# Patient Record
Sex: Male | Born: 1960
Health system: Southern US, Community
[De-identification: ages and names within clinical notes are randomized; demographics above are authoritative.]

## PROBLEM LIST (undated history)

## (undated) DIAGNOSIS — R570 Cardiogenic shock: Secondary | ICD-10-CM

## (undated) DIAGNOSIS — M5090 Cervical disc disorder, unspecified, unspecified cervical region: Secondary | ICD-10-CM

## (undated) DIAGNOSIS — N529 Male erectile dysfunction, unspecified: Secondary | ICD-10-CM

## (undated) DIAGNOSIS — E785 Hyperlipidemia, unspecified: Secondary | ICD-10-CM

## (undated) DIAGNOSIS — I5031 Acute diastolic (congestive) heart failure: Secondary | ICD-10-CM

## (undated) DIAGNOSIS — I77810 Thoracic aortic ectasia: Secondary | ICD-10-CM

## (undated) DIAGNOSIS — I2510 Atherosclerotic heart disease of native coronary artery without angina pectoris: Secondary | ICD-10-CM

## (undated) DIAGNOSIS — I10 Essential (primary) hypertension: Secondary | ICD-10-CM

## (undated) DIAGNOSIS — R7303 Prediabetes: Secondary | ICD-10-CM

## (undated) HISTORY — DX: Cervical disc disorder, unspecified, unspecified cervical region: M50.90

## (undated) HISTORY — DX: Male erectile dysfunction, unspecified: N52.9

## (undated) HISTORY — PX: CERVICAL SPINE SURGERY: SHX589

---
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
## 1998-03-04 HISTORY — PX: TOTAL HIP ARTHROPLASTY: SHX124

## 1999-02-01 ENCOUNTER — Encounter: Payer: Self-pay | Admitting: Orthopaedic Surgery

## 1999-02-06 ENCOUNTER — Inpatient Hospital Stay (HOSPITAL_COMMUNITY): Admission: RE | Admit: 1999-02-06 | Discharge: 1999-02-10 | Payer: Self-pay | Admitting: Orthopaedic Surgery

## 1999-02-06 ENCOUNTER — Encounter: Payer: Self-pay | Admitting: Orthopaedic Surgery

## 1999-02-06 ENCOUNTER — Encounter (INDEPENDENT_AMBULATORY_CARE_PROVIDER_SITE_OTHER): Payer: Self-pay | Admitting: *Deleted

## 2001-03-04 HISTORY — PX: REVISION TOTAL HIP ARTHROPLASTY: SHX766

## 2001-05-15 ENCOUNTER — Inpatient Hospital Stay (HOSPITAL_COMMUNITY): Admission: AD | Admit: 2001-05-15 | Discharge: 2001-05-18 | Payer: Self-pay | Admitting: Neurology

## 2001-05-16 ENCOUNTER — Encounter: Payer: Self-pay | Admitting: Neurology

## 2004-08-20 ENCOUNTER — Encounter: Admission: RE | Admit: 2004-08-20 | Discharge: 2004-08-20 | Payer: Self-pay | Admitting: Orthopaedic Surgery

## 2004-12-13 ENCOUNTER — Inpatient Hospital Stay (HOSPITAL_COMMUNITY): Admission: RE | Admit: 2004-12-13 | Discharge: 2004-12-15 | Payer: Self-pay | Admitting: Orthopaedic Surgery

## 2008-01-07 ENCOUNTER — Encounter: Admission: RE | Admit: 2008-01-07 | Discharge: 2008-01-07 | Payer: Self-pay | Admitting: Nephrology

## 2009-06-13 ENCOUNTER — Encounter (INDEPENDENT_AMBULATORY_CARE_PROVIDER_SITE_OTHER): Payer: Self-pay | Admitting: *Deleted

## 2009-06-16 ENCOUNTER — Encounter (INDEPENDENT_AMBULATORY_CARE_PROVIDER_SITE_OTHER): Payer: Self-pay | Admitting: *Deleted

## 2009-06-21 ENCOUNTER — Ambulatory Visit: Payer: Self-pay | Admitting: Internal Medicine

## 2009-07-03 ENCOUNTER — Ambulatory Visit: Payer: Self-pay | Admitting: Internal Medicine

## 2010-03-04 HISTORY — PX: CERVICAL SPINE SURGERY: SHX589

## 2010-04-05 NOTE — Procedures (Signed)
Summary: Colonoscopy  Patient: Rebekah Sprinkle Note: All result statuses are Final unless otherwise noted.  Tests: (1) Colonoscopy (COL)   COL Colonoscopy           DONE     [facility]     [address]     [place], [place]  [address]           COLONOSCOPY PROCEDURE REPORT           PATIENT:  Avraj, Lindroth  MR#:  [id number]     BIRTHDATE:  April 23, 1960, 48 yrs. old  GENDER:  male     ENDOSCOPIST:  Hedwig Morton. Juanda Chance, MD     REF. BY:  Jeri Cos, M.D.     PROCEDURE DATE:  07/03/2009     PROCEDURE:  Colonoscopy 61607     ASA CLASS:  Class II     INDICATIONS:  Routine Risk Screening maternal cousin with colon     cancer     MEDICATIONS:   Versed 8 mg, Fentanyl 75 mcg           DESCRIPTION OF PROCEDURE:   After the risks benefits and     alternatives of the procedure were thoroughly explained, informed     consent was obtained.  Digital rectal exam was performed and     revealed no rectal masses.   The [id number] endoscope was     introduced through the anus and advanced to the cecum, which was     identified by both the appendix and ileocecal valve, without     limitations.  The quality of the prep was good, using MoviPrep.     The instrument was then slowly withdrawn as the colon was fully     examined.     <<PROCEDUREIMAGES>>           FINDINGS:  No polyps or cancers were seen (see image1, image2, and     image3).   Retroflexed views in the rectum revealed no     abnormalities.    The scope was then withdrawn from the patient     and the procedure completed.           COMPLICATIONS:  None     ENDOSCOPIC IMPRESSION:     1) No polyps or cancers     2) Normal colonoscopy     RECOMMENDATIONS:     1) high fiber diet     REPEAT EXAM:  In 10 year(s) for.           ______________________________     Hedwig Morton. Juanda Chance, MD           CC:           n.     eSIGNED:   Hedwig Morton. Raelyn Racette at 07/03/2009 03:37 PM           Nadara Mode, [id number]  Note: An exclamation  mark (!) indicates a result that was not dispersed into the flowsheet. Document Creation Date: 07/03/2009 3:38 PM _______________________________________________________________________  (1) Order result status: Final Collection or observation date-time: 07/03/2009 15:32 Requested date-time:  Receipt date-time:  Reported date-time:  Referring Physician:   Ordering Physician: Lina Sar [id number]) Specimen Source:  Source: Launa Grill Order Number: [id number] Lab site:   Appended Document: Colonoscopy    Clinical Lists Changes  Observations: Added new observation of COLONNXTDUE: 07/2019 (07/03/2009 15:52)

## 2010-04-05 NOTE — Letter (Signed)
Summary: [facility] Instructions  [name]  [address] [place], [place] [address]   Phone: [phone]  Fax: [phone]       Zachary Flynn    [birth year]    MRN: [id number]        Procedure Day [name]:  [name]  07/03/09     Arrival Time:  2:30PM     Procedure Time:  3:30PM     Location of Procedure:                    _X _  [facility] (4th Floor)                       PREPARATION FOR COLONOSCOPY WITH MOVIPREP   Starting 5 days prior to your procedure 06/28/09 do not eat nuts, seeds, popcorn, corn, beans, peas,  salads, or any raw vegetables.  Do not take any fiber supplements (e.g. Metamucil, Citrucel, and Benefiber).  THE DAY BEFORE YOUR PROCEDURE         DATE:  07/02/09  DAY: SUNDAY  1.  Drink clear liquids the entire day-NO SOLID FOOD  2.  Do not drink anything colored red or purple.  Avoid juices with pulp.  No orange juice.  3.  Drink at least 64 oz. (8 glasses) of fluid/clear liquids during the day to prevent dehydration and help the prep work efficiently.  CLEAR LIQUIDS INCLUDE: Water Jello Ice Popsicles Tea (sugar ok, no milk/cream) Powdered fruit flavored drinks Coffee (sugar ok, no milk/cream) Gatorade Juice: apple, white grape, white cranberry  Lemonade Clear bullion, consomm, broth Carbonated beverages (any kind) Strained chicken noodle soup Hard Candy                             4.  In the morning, mix first dose of MoviPrep solution:    Empty 1 Pouch A and 1 Pouch B into the disposable container    Add lukewarm drinking water to the top line of the container. Mix to dissolve    Refrigerate (mixed solution should be used within 24 hrs)  5.  Begin drinking the prep at 5:00 p.m. The MoviPrep container is divided by 4 marks.   Every 15 minutes drink the solution down to the next mark (approximately 8 oz) until the full liter is complete.   6.  Follow completed prep with 16 oz of clear liquid of your choice (Nothing red  or purple).  Continue to drink clear liquids until bedtime.  7.  Before going to bed, mix second dose of MoviPrep solution:    Empty 1 Pouch A and 1 Pouch B into the disposable container    Add lukewarm drinking water to the top line of the container. Mix to dissolve    Refrigerate  THE DAY OF YOUR PROCEDURE      DATE: 07/03/09  DAY: MONDAY  Beginning at 10:30AM (5 hours before procedure):         1. Every 15 minutes, drink the solution down to the next mark (approx 8 oz) until the full liter is complete.  2. Follow completed prep with 16 oz. of clear liquid of your choice.    3. You may drink clear liquids until 1:30PM (2 HOURS BEFORE PROCEDURE).   MEDICATION INSTRUCTIONS  Unless otherwise instructed, you should take regular prescription medications with a small sip of water   as early as possible the morning  of your procedure.         OTHER INSTRUCTIONS  You will need a responsible adult at least 52 years of age to accompany you and drive you home.   This person must remain in the waiting room during your procedure.  Wear loose fitting clothing that is easily removed.  Leave jewelry and other valuables at home.  However, you may wish to bring a book to read or  an iPod/MP3 player to listen to music as you wait for your procedure to start.  Remove all body piercing jewelry and leave at home.  Total time from sign-in until discharge is approximately 2-3 hours.  You should go home directly after your procedure and rest.  You can resume normal activities the  day after your procedure.  The day of your procedure you should not:   Drive   Make legal decisions   Operate machinery   Drink alcohol   Return to work  You will receive specific instructions about eating, activities and medications before you leave.    The above instructions have been reviewed and explained to me by   Wyona Almas RN  June 21, 2009 1:14 PM     I fully understand and can  verbalize these instructions _____________________________ Date _________

## 2010-04-05 NOTE — Miscellaneous (Signed)
Summary: [facility] Previsit/prep  Clinical Lists Changes  Medications: Added new medication of MOVIPREP 100 GM  SOLR (PEG-KCL-NACL-NASULF-NA ASC-C) As per prep instructions. - Signed Rx of MOVIPREP 100 GM  SOLR (PEG-KCL-NACL-NASULF-NA ASC-C) As per prep instructions.;  #1 x 0;  Signed;  Entered by: Wyona Almas RN;  Authorized by: Hart Carwin MD;  Method used: Electronically to [facility] [address], [place], [place]  [address], Ph: [phone] or [phone], Fax: [phone] Observations: Added new observation of NKA: T (06/21/2009 12:35)    Prescriptions: MOVIPREP 100 GM  SOLR (PEG-KCL-NACL-NASULF-NA ASC-C) As per prep instructions.  #1 x 0   Entered by:   Wyona Almas RN   Authorized by:   Hart Carwin MD   Signed by:   Wyona Almas RN on 06/21/2009   Method used:   Electronically to        [facility] (retail)       [address]       [place], [place]  [address]       Ph: [phone] or [phone]       Fax: [phone]   RxID:   [id number]

## 2010-04-05 NOTE — Letter (Signed)
Summary: Previsit letter  [facility]  [address] [place], [place] [address]   Phone: [phone]  Fax: [phone]       06/13/2009 MRN: [id number]  [facility] [address] [place], [place]  [address]  Dear Zachary Flynn,  Welcome to the Gastroenterology Division at [facility].    You are scheduled to see a nurse for your pre-procedure visit on 06-21-09 at 1pm on the 3rd floor at [facility], [address]  We ask that you try to arrive at our office 15 minutes prior to your appointment time to allow for check-in.  Your nurse visit will consist of discussing your medical and surgical history, your immediate family medical history, and your medications.    Please bring a complete list of all your medications or, if you prefer, bring the medication bottles and we will list them.  We will need to be aware of both prescribed and over the counter drugs.  We will need to know exact dosage information as well.  If you are on blood thinners (Coumadin, Plavix, Aggrenox, Ticlid, etc.) please call our office today/prior to your appointment, as we need to consult with your physician about holding your medication.   Please be prepared to read and sign documents such as consent forms, a financial agreement, and acknowledgement forms.  If necessary, and with your consent, a friend or relative is welcome to sit-in on the nurse visit with you.  Please bring your insurance card so that we may make a copy of it.  If your insurance requires a referral to see a specialist, please bring your referral form from your primary care physician.  No co-pay is required for this nurse visit.     If you cannot keep your appointment, please call [phone] to cancel or reschedule prior to your appointment date.  This allows [place] the opportunity to schedule an appointment for another patient in need of care.    Thank you for choosing [facility] for your medical  needs.  We appreciate the opportunity to care for you.  Please visit [place] at our website  to learn more about our practice.                     Sincerely.                                                                                                                   The Gastroenterology Division

## 2010-07-20 NOTE — H&P (Signed)
[place]  Patient:    Zachary Flynn, Zachary Flynn Visit Number: [id number] MRN: [id number]          Service Type: MED Location: 3000 [id number] [id number] Attending Physician:  Lesly Dukes Dictated by:   Marlan Palau, M.D. Admit Date:  05/15/2001   CC:         Jarome Matin, M.D.  Osborn Coho, M.D.  [facility], [address] [place], [place]   History and Physical  CHIEF COMPLAINT: Zachary Flynn is a 50 year old left-handed black gentleman, born 1961-02-17, with a history of blurred vision over the last week prior to this admission.  HISTORY OF PRESENT ILLNESS: This patient noted onset of left eye aching sensations, pain with eye movement that began approximately one week prior to this admission, and has progressed with evidence of fogging, central scotoma. The patient has had washout of colors as well.  Two days prior to admission he had noted similar problems with the right eye as well.  The patient has also noted that with neck flexion he gets paresthesias down into the leg below the knees.  This patient has not noted any other numbness, weakness, gait disturbance, bowel or bladder disturbance that has occurred.  The patient was seen by Dr. Marti Sleigh and was given some eye drops, but upon return for re-visit was thought to have bilateral papilledema.  The patient was sent over for further evaluation and was admitted for what appears to be bilateral optic neuritis.  PAST MEDICAL HISTORY:  1. Unremarkable with exception of the bilateral visual loss as above.  2. History of left hip surgery in December 2002 with degenerative arthritis.  MEDICATIONS: The patient is on no medications.  ALLERGIES: None known.  SOCIAL HISTORY: Does not smoke or drink.  The patient is married.  Has one daughter, one son.  Lives with his wife and children.  He has a high school education plus one year of college.  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  FAMILY HISTORY: Notable for some problems with high blood pressure, diabetes running in the family.  REVIEW OF SYSTEMS: Notable for some visual loss, eye pain, blurred vision as above.  Otherwise, the Review Of Systems is unremarkable.  PHYSICAL EXAMINATION:  VITAL SIGNS: Blood pressure 146/92, heart rate 84 and regular.  Height 5 feet 9 inches.  Weight 192 pounds.  HEENT: Visual acuity is 20/40 -1 OD, cannot be tested in the left eye.  HEENT: Head atraumatic.  Pupils are dilated at the time of this examination. Discs were flat bilaterally.  No venous pulsations were seen.  Extraocular movements were full.  NECK: Supple.  No carotid bruits noted.  RESPIRATORY: Clear.  CARDIOVASCULAR: Regular rate and rhythm.  No obvious murmurs or rubs noted.  ABDOMEN: Positive bowel sounds.  No organomegaly or tenderness noted.  EXTREMITIES: Without significant edema.  NEUROLOGIC: Cranial nerves as above.  Facial symmetry is present.  The patient has good sensation of face to pinprick and soft touch bilaterally.  The patient has good strength of facial muscles and the muscles with head turning and shoulder shrug bilaterally.  The patient has relatively full visual fields to double simultaneous stimulation.  Again, extraocular movements were full; no evidence of nystagmus or intraocular ophthalmoplegia seen on either side. Motor testing shows 5/5 strength in all fours.  Good symmetric motor tone is noted throughout.  Sensory testing is intact to pinprick, soft  touch, vibratory sensation, position sense in all fours.  No evidence of extinction seen.  The patient has good symmetry reflexes.  Toes are downgoing bilaterally.  The patient had good finger-to-nose-to-finger and heel-to-shin. Gait normal.  Tandem gait normal.  Romberg negative.  No evidence of pronator drift is seen.  LABORATORY DATA: Pending at the time of this  evaluation.  IMPRESSION: History of progressive visual loss, probable bilateral optic neuritis, left greater than right.  This patient also appears to report Lhermittes sign that has developed over the last several days.  Most likely etiology of the current symptomatology would be rapid onset optic neuritis, likely associated with multiple sclerosis.  Do need to consider and rule out other etiologies such as Devics disease, Lebers optic neuritis, and other toxic sources of optic neuritis such as wood alcohol exposure.  The patient is being admitted for further evaluation.  I suppose a pituitary or a parasella process does need to be considered.  PLAN:  1. Admission to [facility]. [facility].  2. High-dose methylprednisolone therapy.  3. MRI scan of the brain.  4. MRI of the cervical spine.  5. Admission blood work.  6. Consider lumbar puncture. Dictated by:   Marlan Palau, M.D. Attending Physician:  Lesly Dukes DD:  05/16/01 TD:  05/18/01 Job: [id number]

## 2010-07-20 NOTE — Discharge Summary (Signed)
Zachary Flynn, Zachary Flynn               ACCOUNT NO.:  [id number]   MEDICAL RECORD NO.:  [id number]          PATIENT TYPE:  INP   LOCATION:  5006                         FACILITY:  [facility]   PHYSICIAN:  Claude Manges. Whitfield, M.D.DATE OF BIRTH:  [birth year]   DATE OF ADMISSION:  12/13/2004  DATE OF DISCHARGE:  12/15/2004                                 DISCHARGE SUMMARY   ADMISSION DIAGNOSES:  1.  Loosening left total hip arthroplasty.  2.  Elevated blood pressure.   DISCHARGE DIAGNOSES:  Loose left total hip arthroplasty, acetabular  component, with revision of femoral head and acetabular polyethylene  component and curettage of acetabular cyst with small grafting.  1.  Acute blood loss anemia secondary to surgery.  2.  Elevated blood pressure.   SURGICAL PROCEDURES:  On December 13, 2004 Zachary Flynn underwent a revision of  the femoral head and the acetabular  polyethylene component of his left  total hip with curettage of the acetabular cyst and bone grafting by Dr.  Claude Manges. Whitfield, assisted by Rinaldo Ratel and Arnoldo Morale, PA-C. He  had an articulus femoral head size 32 mm + size 12 14 cone with a Duraloc  Marathon acetabular liner 10 degrees 32 mm inner diameter, 54 mm outer  diameter in a Duraloc dynamic rocking ring size 54 mm.   COMPLICATIONS:  None.   CONSULTANT:  1.  Pharmacy consult with Coumadin therapy December 13, 2004.  2.  [facility]  and physical therapy consult December 14, 2004.   HISTORY OF PRESENT ILLNESS:  This 50 year old black male patient presented  to Dr. Cleophas Dunker with a history of a left hip replacement done by Dr.  Cleophas Dunker in December  2000. When he came for his normal hip checkup with no  complaints in June he was noted to have signs of loosening about the hip. CT  scan showed large peri-acetabular cyst about the acetabular component and  eccentric wear of the polyethylene. Because this is so early he is  presenting for revision of the cup and  polyethylene component.   HOSPITAL COURSE:  Zachary Flynn tolerated the surgical process procedure well  without immediate postoperative complications. He was transferred to 5000.  Postop day #1 T-max 100.6, vitals stable. Pain controlled with meds.  Dressing was intact without drainage. He was started on therapy per protocol  and plans were made for possible discharge in the next day or so.   Postoperative day #2, he remained afebrile. He was switched effectively to  p.o. pain meds. He was ambulating well and it was felt he was ready for  discharge home and was discharged home later that day.   DISCHARGE INSTRUCTIONS:  Diet: He is to resume his regular pre-  hospitalization diet.   MEDICATIONS:  He was on no preop meds.  1.  Coumadin 10 milligrams p.o. q. Saturday and Sunday at 6:00 p.m. and he      is to have an INR check on December 17, 2004 and then [facility] pharmacy      will adjust the dose.  2.  Percocet 5/3 25 milligrams  1-2 tablets p.o. q.4 h p.r.n. for pain, 50      with no refill.  3.  Robaxin 500 milligrams 1-2 tablets p.o. q.6 h p.r.n. for spasms, 40 with      no refills.   ACTIVITY:  He can be out of bed partial weightbearing 50% or less on the  left leg with use of a walker. He is to have home health PT per [facility]. Please see the blue total knee discharge sheet for further  activity instructions.   Wound care: He may shower after no drainage from the wound for 2 days.  Please see the blue total knee discharge sheet for further wound care  instructions.   Follow-up: He is to follow up with Dr. Cleophas Dunker in our office in  approximately 10-12 days and he is to call [phone] for that appointment. He  is to follow up with Dr. Leretha Dykes for a blood pressure check in the next 1-2  weeks and he is to call his office for that appointment.   Laboratory data: X-ray taken of his left hip on October 12 showed  satisfactory appearance of the left total hip  arthroplasty.   On October 6 hemoglobin/hematocrit were 13.8 and 41.2. On October 14  hemoglobin 11.6, hematocrit 34.7.   October 6 PT 13.1, INR 1, on October 14 PT 15.4, INR 1.2.   On October 6 glucose was 134. It dropped to a low of 130 on the 14th.  Potassium was 3.9 on October 6 and on  October 14  it was 3.4. All other  laboratory studies were within normal limits.      Legrand Pitts Duffy, P.A.      Claude Manges. Cleophas Dunker, M.D.  Electronically Signed    KED/MEDQ  D:  02/08/2005  T:  02/08/2005  Job:  [id number]

## 2010-07-20 NOTE — Discharge Summary (Signed)
[place]  Patient:    Zachary Flynn, Zachary Flynn Visit Number: [id number] MRN: [id number]          Service Type: MED Location: 3000 3041 01 Attending Physician:  Lesly Dukes Dictated by:   Marlan Palau, M.D. Admit Date:  05/15/2001 Discharge Date: 05/18/2001   CC:         [facility], [address]   Discharge Summary  ADMISSION DIAGNOSIS:  Bilateral optic neuritis, rule out multiple sclerosis.  DISCHARGE DIAGNOSES:  Bilateral optic neuritis.  PROCEDURES: 1. MRI of the brain. 2. MRI of the cervical spine. 3. Lumbar puncture.  COMPLICATIONS OF ABOVE PROCEDURES:  None.  HISTORY OF PRESENT ILLNESS:  Zachary Flynn is a 50 year old right-handed black gentleman born Feb 21, 1961, with a history of progressive visual loss in the left eye dating one week prior to this admission with onset of right eye vision changes two days prior to admission.  This patient has had some pain in the eyes with movement, has a central scotoma in the left eye, not on the right.  Colors are washed out.  The patient also describes what sounds like a Lhermittes phenomenon with tingling in both legs below the knees with neck flexion.  The patient was brought in for evaluation of the optic neuritis and to rule out multiple sclerosis.  PAST MEDICAL HISTORY: 1. Bilateral optic neuritis as above. 2. Prior left hip surgery.  MEDICATIONS:  The patient was on no medications prior to admission.  HABITS:  The patient does not smoke or drink.  ALLERGIES:  No known allergies.  Please refer to History & Physical Examination for Social History, Family History, Review of Systems, Physical Examination.  DIAGNOSTIC DATA:  Laboratory values notable for spinal fluid glucose of 108, total protein 20, cryptococcal antigen negative.  VDRL is pending.  IgG albumin ratio.  Oligoclonal banding is pending.  Glycosylated hemoglobin level 5.1.   Spinal fluid showed 3 red cells, 2 white cells.  White count 6.1, hemoglobin 13.4, hematocrit 39.5, MCV 83.6, platelets 180. Sed rate 9.  INR 1.0.  Sodium 138, potassium 3.5, chloride 105, CO2 27, glucose 151, BUN 10, creatinine 0.9, calcium 9.0, total protein 6.6, albumin 3.9, AST 27, ALT 32, alkaline phosphatase 92, total bilirubin 0.7.  Rheumatoid factor negative.  ANA pending.  Lymes titer pending.  HOSPITAL COURSE:  The patient has done well during the course of hospitalization.  This patient has been admitted for progressive optic neuritis that is more prominent on the left than the right.  The patient was subjected to a three-day methylprednisolone IV protocol, receiving 5 mg q.12h. The patient has done well with the protocol, has begun to regain vision in both eyes, but still has some problems with the left eye.  The patient notes that colors have improved in vision in both eyes, and the central scotoma that was noted on the left is now disappearing.  The patient denies any pain in the eyes at this point.  The patient has had no new symptoms of numbness, weakness.  The Lhermittes phenomenon has disappeared.  The patient underwent an MRI scan of the brain that was unremarkable.  MRI of the cervical spine was unremarkable.  Lumbar puncture was performed, and spinal fluid analysis to some degree is pending to include oligoclonal banding, Lymes titer.  The patient has also had an ACE level (angiotensin-converting enzyme level) in the spinal fluid is also pending.  At this point, the  patient will be discharged with a prednisone taper beginning at 40 mg a day and taking off about 5 mg every 3 days until she is off the medication.  Restoril 30 mg at night if needed for sleep.  She will follow up with [facility] within three weeks following discharge.  We will check a visual evoked response test sometime around that point. Dictated by:   Marlan Palau,  M.D. Attending Physician:  Lesly Dukes DD:  05/18/01 TD:  05/18/01 Job: [id number]

## 2010-07-20 NOTE — Op Note (Signed)
[facility]  Patient:    Zachary Flynn, Zachary Flynn Visit Number: [id number] MRN: [id number]          Service Type: MED Location: [id number] Attending Physician:  Lesly Dukes Dictated by:   Marlan Palau, M.D. Proc. Date: 05/16/01 Admit Date:  05/15/2001                             Operative Report  PROCEDURE:  Lumbar puncture note.  HISTORY:  This is a 50 year old black gentleman with a history of bilateral optic neuritis who is being evaluated for possible demyelinating disease.  MRI scan of the brain and MRI of the cervical spine were normal.  DESCRIPTION OF PROCEDURE:  Lumbar puncture was performed in the fetal position on the right side.  The lower back was cleaned with Betadine solution. Approximately 2 cc of 1% Xylocaine was used as local anesthetic.  A 20-gauge spinal needle was inserted at the L3-4 interspace and approximately 12 cc of clear, colorless spinal fluid was removed for testing.  Opening pressure initially was 330 mm water.  Tube #1 was sent for _____  banding, IgG-albumin ratio, Lyme antibody panel.  Tube #2 was sent for VDRL, Cryptococcal antigen, angiotensin converting enzyme level.  Tube #3 sent for cell differential, glucose, protein.  No complications to the above procedure were noted.  The patient tolerated the procedure well. Dictated by:   Marlan Palau, M.D. Attending Physician:  Lesly Dukes DD:  05/16/01 TD:  05/18/01 Job: [id number]

## 2010-07-20 NOTE — H&P (Signed)
[name], Flynn               ACCOUNT NO.:  [id number]   MEDICAL RECORD NO.:  [id number]          PATIENT TYPE:  INP   LOCATION:  NA                           FACILITY:  [facility]   PHYSICIAN:  Zachary Flynn, M.D.DATE OF BIRTH:  [birth year]   DATE OF ADMISSION:  12/13/2004  DATE OF DISCHARGE:                                HISTORY & PHYSICAL   CHIEF COMPLAINT:  Signs of loosening of his left hip replacement on x-ray.   HISTORY OF PRESENT ILLNESS:  This 50 year old, black male patient presented  to Dr. Cleophas Dunker with a history of a left hip replacement done by him on  February 06, 1999.  He came this June for his normal total hip checkup and on  x-ray was noted to have signs of loosening about the hip.  He then underwent  CT scan did show a large, periacetabular cyst about his acetabular  component.  There was also on x-ray eccentric wear noted of the  polyethylene.  The patient has had no complaints of pain or any injury to  the hip.  He says he has been maybe a little bit more active than he should  have been, but has really not had any increase in pain or any other problems  since the hip was placed.  He says he does have good days and bad days and  some days he has pain with certain movements, but that pattern has not  changed since his hip with placed.   ALLERGIES:  No known drug allergies.   CURRENT MEDICATIONS:  None.   PAST MEDICAL HISTORY:  Optic neuritis treated with IV medications in March  2003.   He denies any history of hypertension and diabetes mellitus, thyroid  disease, hiatal hernia, reflux, peptic ulcer disease, heart disease asthma  or any other chronic medical condition other than noted previously.   PAST SURGICAL HISTORY:  Left total hip arthroplasty February 06, 1999, by Dr.  Claude Manges. Flynn.   He denies any complications from the above-mentioned procedure.   SOCIAL HISTORY:  He denies any history of cigarette smoking, alcohol use or  drug use.   He is married and has two kids.  He lives with his wife and  children in a two-story house with 3 steps into the main entrance.  He is  currently a captain in the police force.  His medical doctor is Dr. Jeri Flynn and his phone number is [phone].   FAMILY HISTORY:  Mother is alive at age 8 with coronary artery disease,  history of a CABG, OA and hypertension.  He does not know anything about his  father.  He had one brother who recently passed away at age 64 with liver  disease due to alcoholism and one sister who just recently passed away at  age 51 with a history of myocardial infarction and diabetes.  He has one  living brother and four living sisters all alive and well.  His children are  a daughter, age 70, son age 45 and they are both alive and well.   REVIEW OF SYSTEMS:  HEENT:  He does wear reading glasses.  MUSCULOSKELETAL:  He has some cervical osteoarthritis and does have some the pain with range  of motion. He has had symptoms of the disk in the past but none recently.  He does complain of sinus congestion with weather changes.  He has been  diagnosed in the past with lactose intolerance and that is controlled with  diet. GENITOURINARY:  He has a remote history of hematuria in 1992, with a  full evaluation and that did resolve.  He has had no other problems.  He  does not have a living will nor power of attorney.  All other systems are  negative and noncontributory.   PHYSICAL EXAMINATION:  GENERAL:  Well-developed, well-nourished, black male  in no acute distress.  Talks easily with examiner.  Walks with a normal  gait. VITAL SIGNS:  Height 5 feet 8 inches, weight 182 pounds, BMI is 27.  Temperature 97.3 degrees Fahrenheit, pulse 80, respirations 20 and BP  160/84.  HEENT:  Normocephalic, atraumatic without frontal or maxillary sinus  tenderness to palpation.  Conjunctiva pink.  Sclerae anicteric. PERLA.  EOMs  intact.  No visible external ear deformities.  Hearing  grossly intact.  Right ear canal occluded with cerumen.  Left tympanic membrane pearly gray  with good light reflex.  Nose and nasal septum midline.  Nasal mucosa pink  and moist without exudates or polyps noted.  Buccal mucosa pink and moist.  Dentition in good repair.  Pharynx without erythema or exudates.  Tongue and  uvula midline.  Tongue without fasciculations and uvula rises equally with  phonation.  NECK:  No visible masses or lesions noted.  Trachea midline.  No palpable  lymphadenopathy or thyromegaly.  Carotids +2 bilaterally without bruits.  Full range of motion, nontender to palpation along the cervical spine.  He  does complain of some pain with range of motion, however.  CARDIAC:  Heart rate and rhythm regular.  S1-S2 present without rubs, clicks  or murmurs noted.  RESPIRATORY:  Respirations even and unlabored.  Breath sounds clear to  auscultation bilaterally without rales or wheezes noted.  ABDOMEN:  Rounded abdominal contour.  Bowel sounds present x4 quadrants.  Soft, nontender to palpation without hepatosplenomegaly or CVA tenderness.  Femoral pulses +1 bilaterally.  Nontender to palpation along the vertebral  column.  BREASTS:  Deferred at this time.  GENITALIA:  Deferred at this time.  RECTAL:  Deferred at this time.  MUSCULOSKELETAL:  No obvious deformities bilateral upper extremities with  full range of motion of these extremities without pain.  Extremities are  symmetrical bilaterally.  Radial pulses +2 bilaterally.  He has full range  of motion of his knees, ankles and toes bilaterally.  DP and PT pulses are  +2.  No calf pain with palpation.  Negative Homans' sign and no lower  extremity edema.  Right hip has full extension and flexion to about 100  degrees without crepitus.  Full internal external rotation without pain.  Good abduction and adduction.  Left hip incision is well-healed and approximated.  No erythema or ecchymosis.  He has full extension of the  hip  and flexion to 90-95 degrees without pain.  He has pretty much full  internal/external rotation without any pain.  Good abduction adduction.  NEUROLOGIC:  Alert and oriented x3.  Cranial nerves II-XII grossly intact.  Strength 5/5 bilateral upper and lower extremities. Rapid alternating  movements intact.  Deep tendon reflexes 2+ bilateral upper and  lower  extremities.  Sensation intact to light touch.   LABORATORY DATA AND X-RAY FINDINGS:  X-rays taken in June 2006, show some  eccentricity of his hip and some cyst formation behind the acetabulum.  It  does appear that the polyethylene is wearing.  CT scan done in late June  shows a large, periacetabular cyst with eccentric wear of the polyethylene.  It is felt he has a histiocytic reaction with a cyst.  Questionable  loosening of the femoral stem.   IMPRESSION:  1.  Signs of loosening left total hip arthroplasty.  2.  Elevated blood pressure.   PLAN:  Mr. Hayashida will be admitted to [facility] on December 13, 2004,  where he will undergo a revision of his loosening left total hip  arthroplasty by Dr. Claude Manges. Flynn.  He will undergo the routine  preoperative laboratory  tests and studies prior to this procedure. As mentioned that he should be  evaluated for his elevated blood pressure prior to admission and he will try  to see Dr. Bascom Levels prior to that.  If he has any medical issues while he is  hospitalized we will consult Dr. Bascom Levels.      Legrand Pitts Duffy, P.A.      Zachary Manges. Cleophas Dunker, M.D.  Electronically Signed    KED/MEDQ  D:  12/03/2004  T:  12/03/2004  Job:  [id number]

## 2010-07-20 NOTE — Op Note (Signed)
NAMEAIMEE, TIMMONS               ACCOUNT NO.:  [id number]   MEDICAL RECORD NO.:  [id number]          PATIENT TYPE:  INP   LOCATION:  2899                         FACILITY:  [facility]   PHYSICIAN:  Claude Manges. 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   OPERATION PERFORMED:  1.  Revision of femoral head and polyethylene acetabular component, left      total hip replacement.  2.  Curettage of acetabular cysts and bone grafting.   SURGEON:  Claude Manges. Cleophas Dunker, M.D.   ASSISTANT:  1.  Lenard Galloway. Chaney Malling, M.D.  2.  Legrand Pitts. Duffy, P.A.   ANESTHESIA:  General orotracheal anesthesia.   COMPLICATIONS:  None.   INSERTED COMPONENTS:  A 32 mm outer diameter femoral head with a +5 neck  length, 54 mm outer diameter DuraLock Marathon polyethylene component and a  DuraLock Dynamic locking ring.   COMPONENTS REMOVED:  A 28 mm femoral head with a +5 neck length and an  Enduron acetabular component.   DESCRIPTION OF PROCEDURE:  With the patient comfortable on the operating  table and under general orotracheal anesthesia, the patient was placed in a  lateral decubitus position with the left side up.  The patient was secured  to the operating room table with the Innomed hip system.  The left hip was  then prepped from the iliac crest to below the knee with Betadine scrub and  then DuraPrep.  Sterile draping was performed.  The previous Southern  incision was utilized and elliptically excised.  By sharp dissection, the  incision was then carried down through adipose tissue to the level of the  iliotibial  band.  Self-retaining retractors were inserted.  I had excellent  hemostasis.  The iliotibial band was then incised and the gluteus muscle  fibers and fibers of the tensor fascia lata were then bluntly separated.  The capsule was identified through thick scar tissue with a spinal needle.  We carefully identified the sciatic nerve.  We inserted the nerve stimulator  and  carefully protected it throughout the operative procedure.   The hip capsule was then incised from the calcar and femoral neck to the  edge of the acetabulum.  The capsule was very thick.  There was considerable  wear debris and reactive tissue throughout the joint space.  Technically, it  was difficult because everything was so scarred, but we had a very nice  resection of all of the wear debris within the joint capsule maintaining  capsular integrity.   Approximately 80% the joint was then debrided of scar tissue and reactive  tissue.  There did not appear to be any evidence of infection.  There was  obvious wear on the polyethylene component as we could easily toggle  the 28  mm head within the acetabulum.   The hip was then dislocated after releasing the capsule.  The head was  removed.  The femoral head was then removed and the [facility] taper neck was  retracted anteriorly. We then removed the polyethylene component which was  inserted six years ago.  It was an Enduron polyethylene. The locking ring  was also removed.  We then carefully checked the acetabulum and felt that it  was perfectly intact without evidence of any obvious loosening.  The  polyethylene component was then placed back in the acetabulum and the [facility]  taper neck placed into the polyethylene so that we could visualize the  superior and anterior aspect of the acetabulum where there appeared to be  cyst formation by CT scan with careful retraction, the cysts were  identified.  The one more anteriorly was large, probably several centimeters  in diameter.   It was carefully debrided and curettaged.  This cyst more in  the central portion superiorly also involved the bone and there really was  not a cyst formation because there was really the bone had just worn away  from the acetabulum but at least 85% to 90% of the acetabulum was covered  with bone.   After debridement of the cyst, we filled it IC graft chamber allograft bone  mixed with Symphony.  It packed very nicely.   The neck was then placed anteriorly.  The acetabular component was removed.  We carefully irrigated the metallic acetabular component, again checked it  and it was perfectly intact.  We moved all soft tissue from around the  periphery and then inserted a 54 mm outer diameter Marathon polyethylene  liner with a 10 degree build up posteriorly.  The Morse taper neck was  carefully cleaned and then a 32 mm outer diameter femoral head with a +5  neck was then reinserted.  The entire construct was reduced and we had  perfect stability in all motions.  The wound was then copiously irrigated  with saline solution.  We checked the acetabulum to be sure there was no  loose material.  Again the sciatic nerve was carefully protected.  The  capsule was closed with 0 Ethibond suture as were short external rotators  which had scarred in.  The wound was again irrigated with saline solution.  The iliotibial band was closed with a running #1 Vicryl.  Subcutaneous  tissue was closed in several layers with 0 and 2-0 Vicryl.  Skin closed with  skin clips.  Sterile bulky dressing was applied followed by a knee  immobilizer.  The patient was placed prone, transferred to the operating  room stretcher and returned to the post anesthesia recovery room in  satisfactory condition.      Claude Manges. Cleophas Dunker, M.D.  Electronically Signed     PWW/MEDQ  D:  12/13/2004  T:  12/13/2004  Job:  [id number]

## 2010-07-20 NOTE — Op Note (Signed)
[facility]  Patient:    Zachary Flynn                       MRN: [id number] Proc. Date: 02/06/99 Adm. Date:  [id number] Attending:  Randolm Idol                           Operative Report  PREOPERATIVE DIAGNOSIS:  End stage osteoarthritis, left hip.  POSTOPERATIVE DIAGNOSIS:  End stage osteoarthritis, left hip.  OPERATION PERFORMED:  Left total hip replacement.  SURGEON:  Claude Manges. Cleophas Dunker, M.D.  ASSISTANT: 1. Lenard Galloway. Chaney Malling, M.D. 2. Jamelle Rushing, P.A.  ANESTHESIA:  General orotracheal.  COMPLICATIONS:  None.  COMPONENTS:  DePuy 100 series, DuraLock acetabular shell, 54 mm outer diameter ith an Enduron acetabular liner, 28 mm inner diameter, 10 degree angle, apex hole eliminator.  The size 12 standard, 5/8 pore coated femoral stem with a 28 mm head, +5 neck.  DESCRIPTION OF PROCEDURE:  With the patient comfortable on the operating table nd under general orotracheal anesthesia, he was placed in the lateral decubitus position with the left side up.  The patient was secured to the operating room table with the Innomed hip system.  The left hip was then prepped with Betadine scrub and then DuraPrep from the iliac crest to below the knees.  Sterile draping was performed.  A routine Southern incision was utilized and by sharp dissection carried down to subcutaneous tissue.  The iliotibial band was identified and incised along the length of the skin incision.  Self-retaining retractors were inserted.  The hip was then internally rotated. The short external rotators were identified and detached from the greater trochanter posteriorly.  The tendinous structures were tagged with 0 Tycron suture.  The capsule was identified and incised along the femoral neck and head. Synovial tissue exuded through the incisional region.  There was a small amount of clear  joint fluid.  Specimens of synovium were sent to the lab for  cytology.  The calcar guide was applied and the neck was then amputated approximately a fingerbreadth and a half from the proximal to the lesser trochanter.  The greater trochanteric notch was identified and a starter hole then made.  Reaming was performed to 11.5 mm o accept a 12 mm prosthesis.  Rasping was then performed with a 10.5 medial modified aspect and then a 12 medial modified aspect but I felt that I had considerably more room so therefore the standard rasp was the inserted without difficulty with a nice flush fit on the calcar.  Acetabular retractors were then inserted in four quadrants, synovial tissue was  excised and the labrum was excised.  Reaming was performed to 53 mm to accept a 54 mm outer diameter prosthesis.  The acetabulum was quite shallow, so we deepened it.  We trialed a 52 mm prosthesis which had good rim fit but we could completely seat it, then a 54 mm outer diameter prosthesis.  It had good rim fit but would not completely seat.  Accordingly we impacted a 54 mm outer diameter 100 series DePuy acetabular shell. We then trialed an acetabular liner followed by the rasp followed by the trial neck.  We trialed several neck lengths and felt that the +5 was the best fit. e had excellent stability in flexion and extension.  The trial components were removed.  The apex hole eliminator inserted into the acetabulum  followed by the 10 degree polyethylene liner.  The femoral component was then impacted at about a 15 degree anteversion.  We then trialed with the +5 28 mm neck.  I felt that we had excellent stability.  Therefore the neck was removed.  The 28 mm hip ball was then impacted.  The entire construct was then reduced.  We had excellent stability.  I felt that the leg lengths had been re-established as he was short preoperatively.  The wound was copiously irrigated with saline antibiotic solution throughout the operative procedure.  The  capsule was closed anatomically with 0 Tycron.  The short external rotators were reapproximated with similar material.  The iliotibial band was closed with running 0 Vicryl.  The subcutaneous closed in two layers with 0 and 2-0 Vicryl.  The skin was closed with skin clips.  Sterile bulky dressing was applied.  The patient was then placed in the supine position, transferred to the stretcher, awakened and returned to the post anesthesia recovery room in stable  condition.  A knee immobilizer was applied to the knee.  The patient tolerated he procedure without complications. DD:  02/06/99 TD:  02/07/99 Job: [id number]

## 2010-09-04 ENCOUNTER — Other Ambulatory Visit (HOSPITAL_COMMUNITY): Payer: Self-pay | Admitting: Nephrology

## 2010-09-04 DIAGNOSIS — E041 Nontoxic single thyroid nodule: Secondary | ICD-10-CM

## 2010-09-27 IMAGING — CR DG CHEST 2V
2 series · 2 of 2 positions shown · non-contrast
Comparison: 12/07/2004

CLINICAL DATA: Shortness of breath for 2 weeks.

CHEST - 2 VIEW

[view not recorded (1 of 2)]
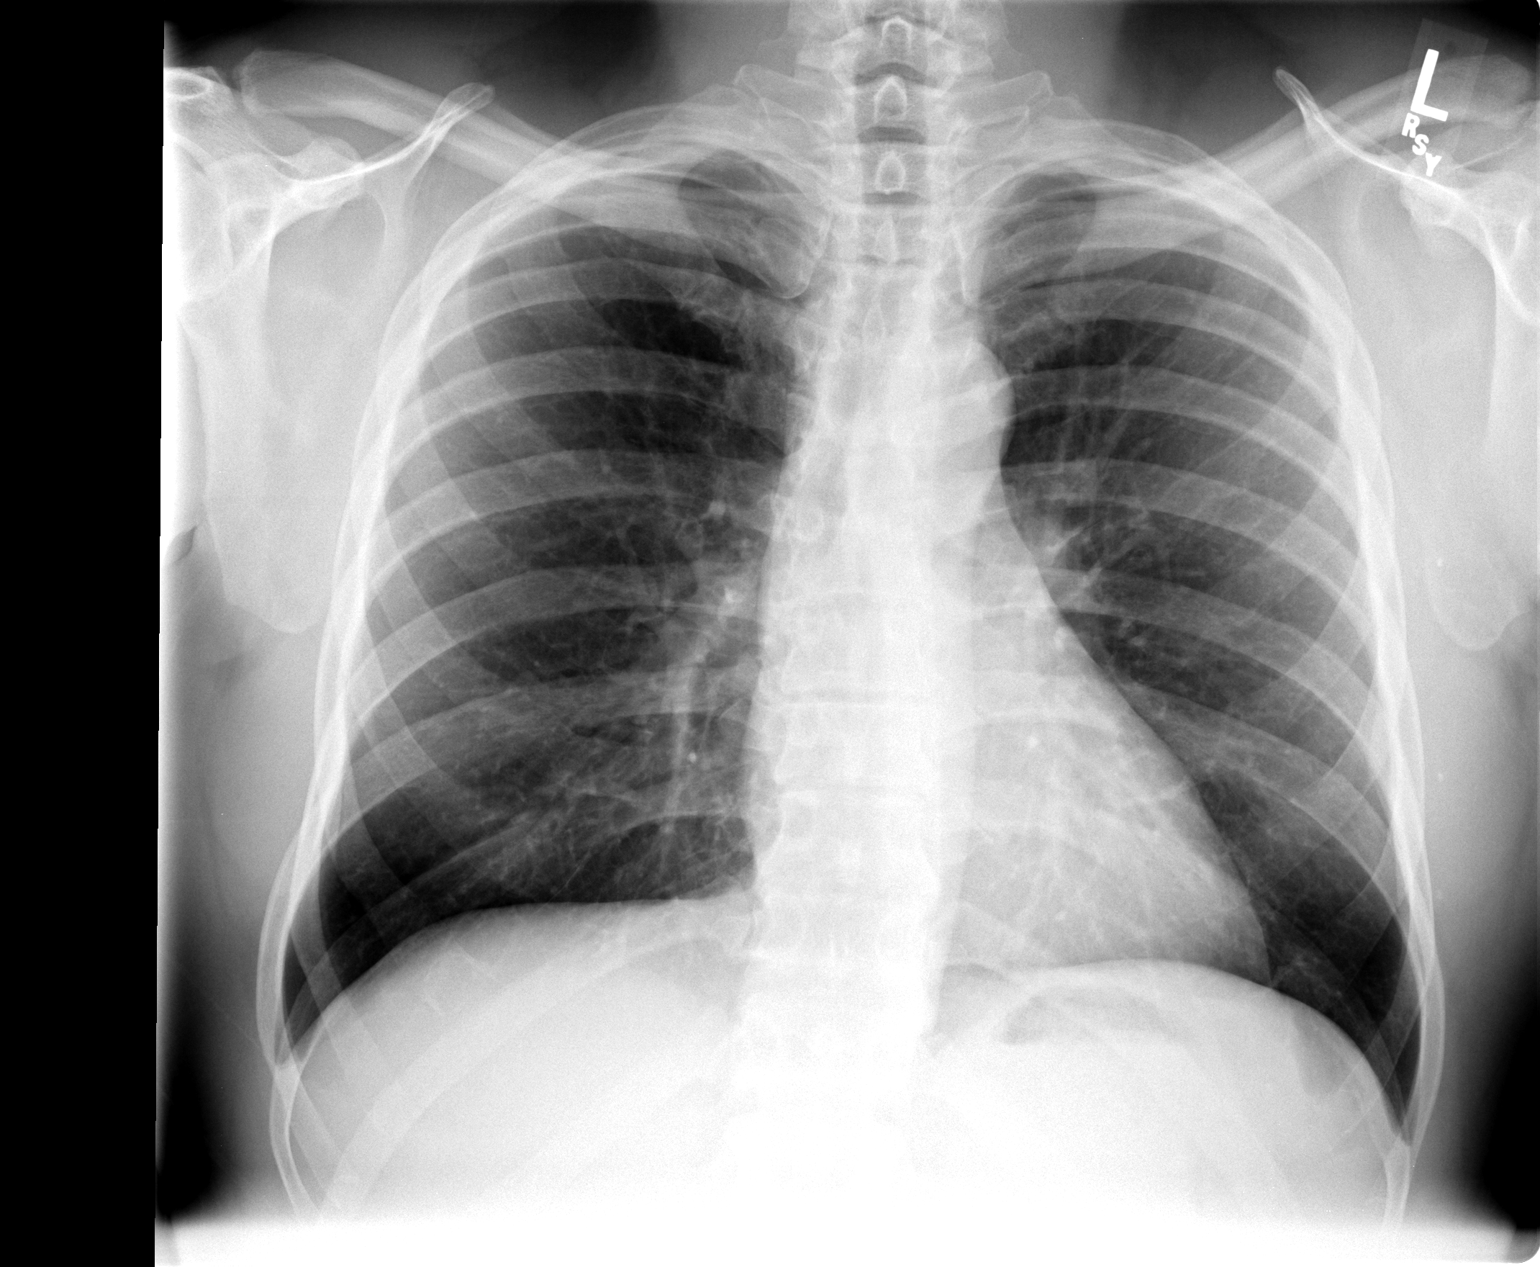

[view not recorded (2 of 2)]
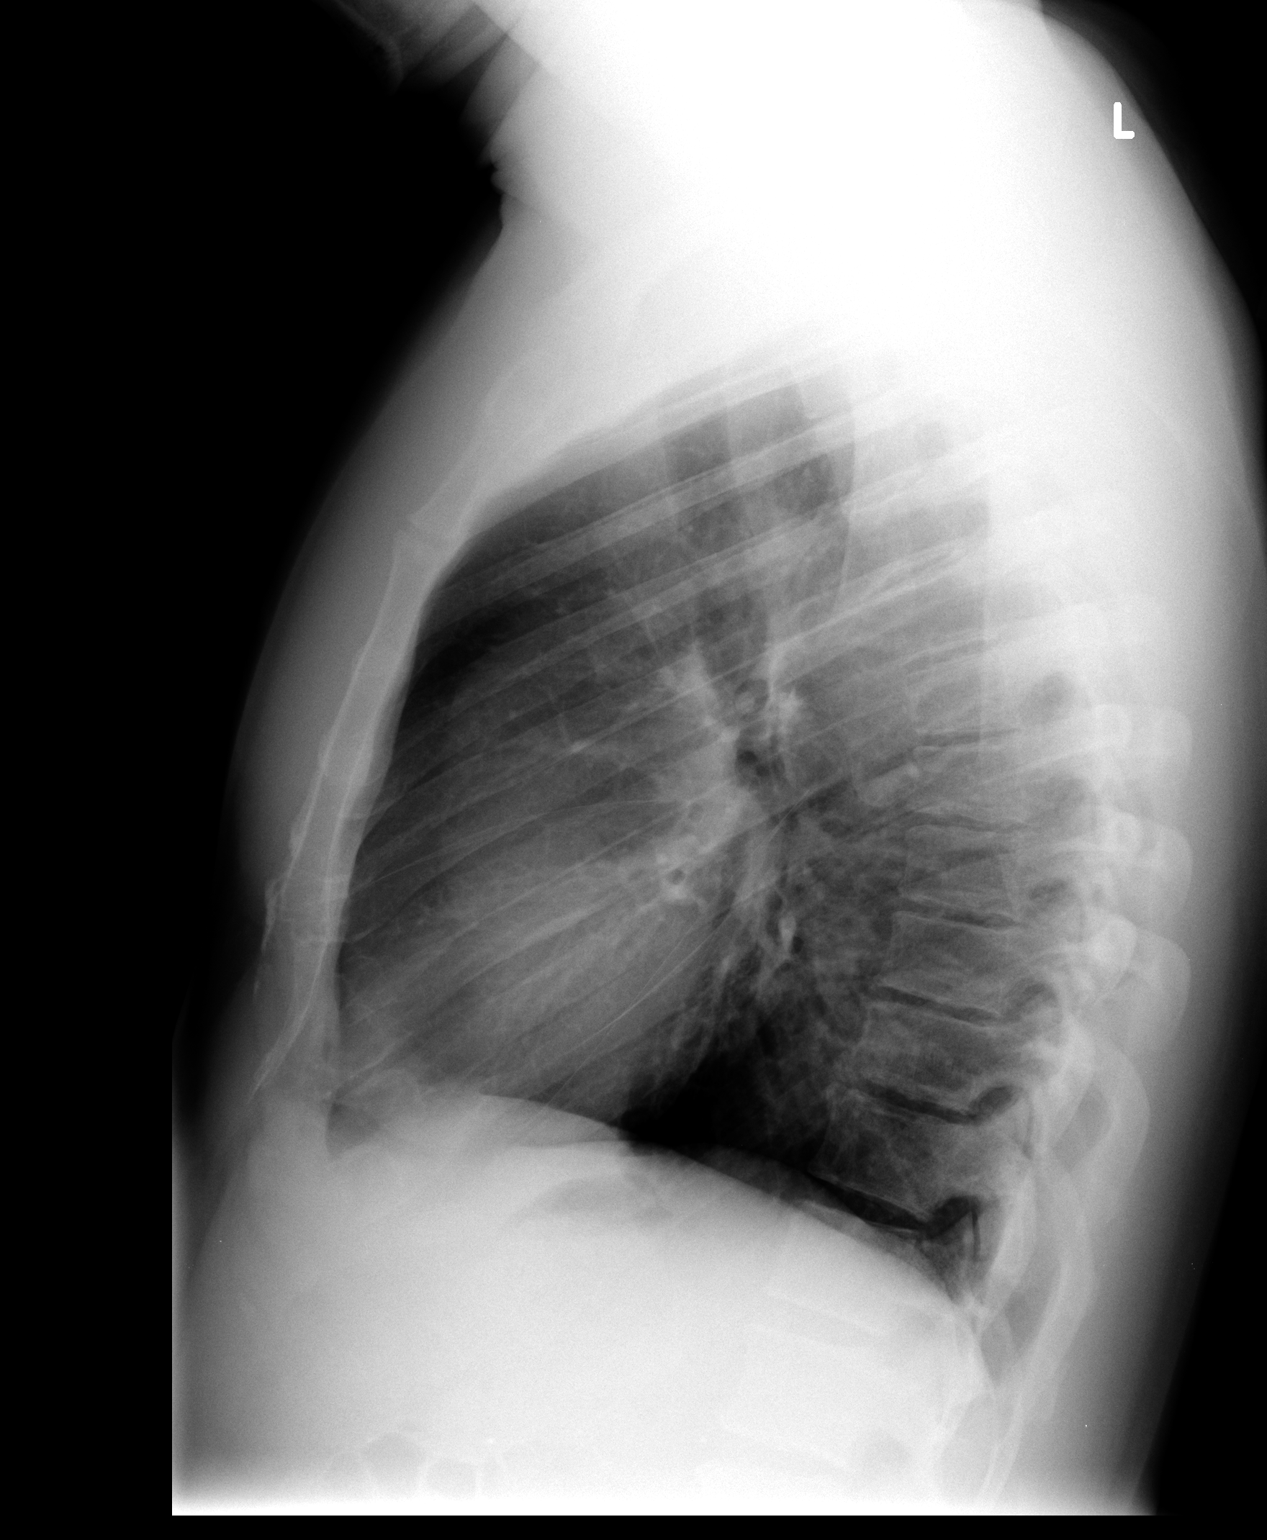

[2 of 2 positions shown; findings below may reference images not displayed]

FINDINGS: Cardiac and mediastinal contours appear normal.

The lungs appear clear.

No pleural effusion is identified.
IMPRESSION: 1.  No acute thoracic findings.

## 2010-10-09 ENCOUNTER — Other Ambulatory Visit (HOSPITAL_COMMUNITY): Payer: Self-pay

## 2010-10-16 ENCOUNTER — Ambulatory Visit (HOSPITAL_COMMUNITY)
Admission: RE | Admit: 2010-10-16 | Discharge: 2010-10-16 | Disposition: A | Discharge status: Home / Self Care | Payer: 59 | Source: Ambulatory Visit | Attending: Nephrology | Admitting: Nephrology

## 2010-10-16 DIAGNOSIS — E041 Nontoxic single thyroid nodule: Secondary | ICD-10-CM | POA: Insufficient documentation

## 2011-11-07 ENCOUNTER — Other Ambulatory Visit: Payer: Self-pay | Admitting: Nephrology

## 2016-03-06 DIAGNOSIS — M5412 Radiculopathy, cervical region: Secondary | ICD-10-CM | POA: Diagnosis not present

## 2016-03-06 DIAGNOSIS — M542 Cervicalgia: Secondary | ICD-10-CM | POA: Diagnosis not present

## 2016-03-06 DIAGNOSIS — M549 Dorsalgia, unspecified: Secondary | ICD-10-CM | POA: Diagnosis not present

## 2016-03-14 DIAGNOSIS — M4802 Spinal stenosis, cervical region: Secondary | ICD-10-CM | POA: Diagnosis not present

## 2016-03-15 DIAGNOSIS — M5412 Radiculopathy, cervical region: Secondary | ICD-10-CM | POA: Diagnosis not present

## 2016-03-21 DIAGNOSIS — M4712 Other spondylosis with myelopathy, cervical region: Secondary | ICD-10-CM | POA: Diagnosis not present

## 2016-04-12 DIAGNOSIS — Z0000 Encounter for general adult medical examination without abnormal findings: Secondary | ICD-10-CM | POA: Diagnosis not present

## 2016-04-12 DIAGNOSIS — M79645 Pain in left finger(s): Secondary | ICD-10-CM | POA: Diagnosis not present

## 2016-04-12 DIAGNOSIS — I10 Essential (primary) hypertension: Secondary | ICD-10-CM | POA: Diagnosis not present

## 2016-04-12 DIAGNOSIS — Z1159 Encounter for screening for other viral diseases: Secondary | ICD-10-CM | POA: Diagnosis not present

## 2016-04-22 DIAGNOSIS — Z981 Arthrodesis status: Secondary | ICD-10-CM | POA: Diagnosis not present

## 2016-04-22 DIAGNOSIS — M4712 Other spondylosis with myelopathy, cervical region: Secondary | ICD-10-CM | POA: Diagnosis not present

## 2016-04-22 DIAGNOSIS — M50121 Cervical disc disorder at C4-C5 level with radiculopathy: Secondary | ICD-10-CM | POA: Diagnosis not present

## 2016-04-22 DIAGNOSIS — M50221 Other cervical disc displacement at C4-C5 level: Secondary | ICD-10-CM | POA: Diagnosis not present

## 2016-05-06 DIAGNOSIS — M65312 Trigger thumb, left thumb: Secondary | ICD-10-CM | POA: Diagnosis not present

## 2016-05-09 DIAGNOSIS — M5412 Radiculopathy, cervical region: Secondary | ICD-10-CM | POA: Diagnosis not present

## 2016-06-05 DIAGNOSIS — M65312 Trigger thumb, left thumb: Secondary | ICD-10-CM | POA: Diagnosis not present

## 2016-06-26 DIAGNOSIS — M5412 Radiculopathy, cervical region: Secondary | ICD-10-CM | POA: Diagnosis not present

## 2016-10-22 DIAGNOSIS — R7301 Impaired fasting glucose: Secondary | ICD-10-CM | POA: Diagnosis not present

## 2017-02-11 DIAGNOSIS — H47219 Primary optic atrophy, unspecified eye: Secondary | ICD-10-CM | POA: Diagnosis not present

## 2017-05-19 DIAGNOSIS — Z0000 Encounter for general adult medical examination without abnormal findings: Secondary | ICD-10-CM | POA: Diagnosis not present

## 2017-05-19 DIAGNOSIS — Z13220 Encounter for screening for lipoid disorders: Secondary | ICD-10-CM | POA: Diagnosis not present

## 2018-02-11 DIAGNOSIS — H35033 Hypertensive retinopathy, bilateral: Secondary | ICD-10-CM | POA: Diagnosis not present

## 2018-07-07 DIAGNOSIS — Z125 Encounter for screening for malignant neoplasm of prostate: Secondary | ICD-10-CM | POA: Diagnosis not present

## 2018-07-07 DIAGNOSIS — Z0000 Encounter for general adult medical examination without abnormal findings: Secondary | ICD-10-CM | POA: Diagnosis not present

## 2018-07-07 DIAGNOSIS — M25559 Pain in unspecified hip: Secondary | ICD-10-CM | POA: Diagnosis not present

## 2018-07-07 DIAGNOSIS — I10 Essential (primary) hypertension: Secondary | ICD-10-CM | POA: Diagnosis not present

## 2018-07-07 DIAGNOSIS — Z13220 Encounter for screening for lipoid disorders: Secondary | ICD-10-CM | POA: Diagnosis not present

## 2018-08-04 ENCOUNTER — Other Ambulatory Visit: Payer: Self-pay

## 2018-08-04 ENCOUNTER — Encounter: Payer: 59 | Attending: Family Medicine | Admitting: *Deleted

## 2018-08-04 DIAGNOSIS — E119 Type 2 diabetes mellitus without complications: Secondary | ICD-10-CM | POA: Insufficient documentation

## 2018-08-04 NOTE — Patient Instructions (Signed)
Plan:  Aim for 4 Carb Choices per meal (60 grams) +/- 1 either way  Aim for 0-2 Carbs per snack if hungry  Include protein in moderation with your meals and snacks Consider reading food labels for Total Carbohydrate of foods Continue with your activity level by walking daily as tolerated Consider getting a meter so you can check BG every few days at alternate times per day

## 2018-08-05 NOTE — Progress Notes (Signed)
Diabetes Self-Management Education  Visit Type: First/Initial  Appt. Start Time: 1530 Appt. End Time: 1700  08/05/2018  Mr. Zachary Flynn, identified by name and date of birth, is a 58 y.o. male with a diagnosis of Diabetes: Type 2. Patient is newly diagnosed about 1 months ago. 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 He is most interested in learning about what foods to eat and what to avoid. He is physically active with his job and enjoys yard work on the weekends. He does not have a meter yet.  ASSESSMENT  There were no vitals taken for this visit. There is no height or weight on file to calculate BMI.  Diabetes Self-Management Education - 08/04/18 1550      Visit Information   Visit Type  First/Initial      Initial Visit   Diabetes Type  Type 2    Are you currently following a meal plan?  No    Are you taking your medications as prescribed?  Not on Medications    Date Diagnosed  07/2018      Health Coping   How would you rate your overall health?  Good      Psychosocial Assessment   Patient Belief/Attitude about Diabetes  Motivated to manage diabetes    Self-care barriers  None    Self-management support  Family    Other persons present  Patient    Patient Concerns  Nutrition/Meal planning;Glycemic Control    Special Needs  None    Preferred Learning Style  Auditory    Learning Readiness  Contemplating    How often do you need to have someone help you when you read instructions, pamphlets, or other written materials from your doctor or pharmacy?  1 - Never    What is the last grade level you completed in school?  Associates degree      Pre-Education Assessment   Patient understands the diabetes disease and treatment process.  Needs Instruction    Patient understands incorporating nutritional management into lifestyle.  Needs Instruction    Patient undertands incorporating  physical activity into lifestyle.  Needs Instruction    Patient understands using medications safely.  Needs Instruction    Patient understands monitoring blood glucose, interpreting and using results  Needs Instruction    Patient understands prevention, detection, and treatment of acute complications.  Needs Instruction    Patient understands prevention, detection, and treatment of chronic complications.  Needs Instruction    Patient understands how to develop strategies to address psychosocial issues.  Needs Instruction      Complications   Last HgB A1C per patient/outside source  5.9 %   Had FBG of 132 at MD office   How often do you check your blood sugar?  0 times/day (not testing)    Number of hypoglycemic episodes per month  0    Have you had a dilated eye exam in the past 12 months?  Yes    Have you had a dental exam in the past 12 months?  No    Are you checking your feet?  Yes    How many days per week are you checking your feet?  5      Dietary Intake   Breakfast  fresh fruit OR cereal with fresh fruit and walnuts OR PNB crackers with fresh fruit OR breakfast biscuit (belvita)     Lunch  brings from  home: 2 lean meat sandwiches on whole grain bread OR 2 burger patties with baked beans and broccoli    Snack (afternoon)  popcorn OR chips when gets home from work    [employer], vegetables, whole grain starch or pinto beans    Snack (evening)  none OR       Exercise   Exercise Type  Light (walking / raking leaves)    How many days per week to you exercise?  6    How many minutes per day do you exercise?  60    Total minutes per week of exercise  360      Patient Education   Previous Diabetes Education  No    Disease state   Factors that contribute to the development of diabetes;Definition of diabetes, type 1 and 2, and the diagnosis of diabetes    Nutrition management   Role of diet in the treatment of diabetes and the relationship between the three main macronutrients  and blood glucose level;Food label reading, portion sizes and measuring food.;Carbohydrate counting;Reviewed blood glucose goals for pre and post meals and how to evaluate the patients' food intake on their blood glucose level.    Physical activity and exercise   Role of exercise on diabetes management, blood pressure control and cardiac health.    Medications  Other (comment)   not on diabetes meds now   Monitoring  Taught/discussed recording of test results and interpretation of SMBG.;Identified appropriate SMBG and/or A1C goals.;Purpose and frequency of SMBG.    Chronic complications  Relationship between chronic complications and blood glucose control    Psychosocial adjustment  Role of stress on diabetes      Individualized Goals (developed by patient)   Nutrition  Follow meal plan discussed    Physical Activity  Exercise 3-5 times per week    Medications  Not Applicable    Monitoring   test blood glucose pre and post meals as discussed      Post-Education Assessment   Patient understands the diabetes disease and treatment process.  Demonstrates understanding / competency    Patient understands incorporating nutritional management into lifestyle.  Demonstrates understanding / competency    Patient undertands incorporating physical activity into lifestyle.  Demonstrates understanding / competency    Patient understands monitoring blood glucose, interpreting and using results  Demonstrates understanding / competency    Patient understands prevention, detection, and treatment of chronic complications.  Demonstrates understanding / competency      Outcomes   Expected Outcomes  Demonstrated interest in learning. Expect positive outcomes    Future DMSE  PRN    Program Status  Not Completed       Individualized Plan for Diabetes Self-Management Training:   Learning Objective:  Patient will have a greater understanding of diabetes self-management. Patient education plan is to attend  individual and/or group sessions per assessed needs and concerns.   Plan:   Patient Instructions  Plan:  Aim for 4 Carb Choices per meal (60 grams) +/- 1 either way  Aim for 0-2 Carbs per snack if hungry  Include protein in moderation with your meals and snacks Consider reading food labels for Total Carbohydrate of foods Continue with your activity level by walking daily as tolerated Consider getting a meter so you can check BG every few days at alternate times per day   Expected Outcomes:  Demonstrated interest in learning. Expect positive outcomes  Education material provided: Food label handouts, A1C conversion sheet, Meal  plan card and Carbohydrate counting sheet  If problems or questions, patient to contact team via:  Phone  Future DSME appointment: PRN

## 2018-11-03 DIAGNOSIS — I5032 Chronic diastolic (congestive) heart failure: Secondary | ICD-10-CM

## 2018-11-03 HISTORY — DX: Chronic diastolic (congestive) heart failure: I50.32

## 2018-12-02 ENCOUNTER — Inpatient Hospital Stay (HOSPITAL_COMMUNITY): Payer: 59

## 2018-12-02 ENCOUNTER — Inpatient Hospital Stay (HOSPITAL_COMMUNITY)
Admission: EM | Admit: 2018-12-02 | Discharge: 2018-12-04 | DRG: 246 | Disposition: A | Discharge status: Home / Self Care | Payer: 59 | Attending: Cardiology | Admitting: Cardiology

## 2018-12-02 ENCOUNTER — Encounter (HOSPITAL_COMMUNITY): Payer: Self-pay | Admitting: Cardiology

## 2018-12-02 ENCOUNTER — Inpatient Hospital Stay (HOSPITAL_COMMUNITY)
Admission: EM | Disposition: A | Discharge status: Home / Self Care | Payer: Self-pay | Source: Home / Self Care | Attending: Cardiology

## 2018-12-02 ENCOUNTER — Other Ambulatory Visit: Payer: Self-pay

## 2018-12-02 DIAGNOSIS — I25110 Atherosclerotic heart disease of native coronary artery with unstable angina pectoris: Secondary | ICD-10-CM | POA: Diagnosis present

## 2018-12-02 DIAGNOSIS — E782 Mixed hyperlipidemia: Secondary | ICD-10-CM | POA: Diagnosis present

## 2018-12-02 DIAGNOSIS — I2129 ST elevation (STEMI) myocardial infarction involving other sites: Secondary | ICD-10-CM | POA: Diagnosis present

## 2018-12-02 DIAGNOSIS — I77810 Thoracic aortic ectasia: Secondary | ICD-10-CM | POA: Diagnosis present

## 2018-12-02 DIAGNOSIS — E785 Hyperlipidemia, unspecified: Secondary | ICD-10-CM

## 2018-12-02 DIAGNOSIS — I2510 Atherosclerotic heart disease of native coronary artery without angina pectoris: Secondary | ICD-10-CM

## 2018-12-02 DIAGNOSIS — Z20828 Contact with and (suspected) exposure to other viral communicable diseases: Secondary | ICD-10-CM | POA: Diagnosis present

## 2018-12-02 DIAGNOSIS — R7303 Prediabetes: Secondary | ICD-10-CM | POA: Diagnosis present

## 2018-12-02 DIAGNOSIS — Z8249 Family history of ischemic heart disease and other diseases of the circulatory system: Secondary | ICD-10-CM

## 2018-12-02 DIAGNOSIS — Z79899 Other long term (current) drug therapy: Secondary | ICD-10-CM | POA: Diagnosis not present

## 2018-12-02 DIAGNOSIS — R570 Cardiogenic shock: Secondary | ICD-10-CM | POA: Diagnosis present

## 2018-12-02 DIAGNOSIS — Z955 Presence of coronary angioplasty implant and graft: Secondary | ICD-10-CM

## 2018-12-02 DIAGNOSIS — I110 Hypertensive heart disease with heart failure: Secondary | ICD-10-CM | POA: Diagnosis present

## 2018-12-02 DIAGNOSIS — I2109 ST elevation (STEMI) myocardial infarction involving other coronary artery of anterior wall: Secondary | ICD-10-CM | POA: Diagnosis not present

## 2018-12-02 DIAGNOSIS — I5031 Acute diastolic (congestive) heart failure: Secondary | ICD-10-CM | POA: Diagnosis present

## 2018-12-02 DIAGNOSIS — I2102 ST elevation (STEMI) myocardial infarction involving left anterior descending coronary artery: Secondary | ICD-10-CM | POA: Diagnosis present

## 2018-12-02 DIAGNOSIS — I10 Essential (primary) hypertension: Secondary | ICD-10-CM | POA: Diagnosis present

## 2018-12-02 DIAGNOSIS — Z96642 Presence of left artificial hip joint: Secondary | ICD-10-CM | POA: Diagnosis present

## 2018-12-02 HISTORY — PX: TRANSTHORACIC ECHOCARDIOGRAM: SHX275

## 2018-12-02 HISTORY — DX: Essential (primary) hypertension: I10

## 2018-12-02 HISTORY — DX: Thoracic aortic ectasia: I77.810

## 2018-12-02 HISTORY — DX: Prediabetes: R73.03

## 2018-12-02 HISTORY — DX: Hyperlipidemia, unspecified: E78.5

## 2018-12-02 HISTORY — DX: Atherosclerotic heart disease of native coronary artery without angina pectoris: I25.10

## 2018-12-02 HISTORY — PX: CORONARY/GRAFT ACUTE MI REVASCULARIZATION: CATH118305

## 2018-12-02 HISTORY — PX: LEFT HEART CATH AND CORONARY ANGIOGRAPHY: CATH118249

## 2018-12-02 LAB — BASIC METABOLIC PANEL
Anion gap: 19 — ABNORMAL HIGH (ref 5–15)
BUN: 15 mg/dL (ref 6–20)
CO2: 22 mmol/L (ref 22–32)
Calcium: 9.1 mg/dL (ref 8.9–10.3)
Chloride: 99 mmol/L (ref 98–111)
Creatinine, Ser: 1.08 mg/dL (ref 0.61–1.24)
GFR calc Af Amer: >60 mL/min (ref >60)
GFR calc non Af Amer: >60 mL/min (ref >60)
Glucose, Bld: 202 mg/dL — ABNORMAL HIGH (ref 70–99)
Potassium: 3.4 mmol/L — ABNORMAL LOW (ref 3.5–5.1)
Sodium: 140 mmol/L (ref 135–145)

## 2018-12-02 LAB — CBC WITH DIFFERENTIAL/PLATELET
Abs Immature Granulocytes: 0.03 10*3/uL (ref 0.00–0.07)
Basophils Absolute: 0 10*3/uL (ref 0.0–0.1)
Basophils Relative: 0 %
Eosinophils Absolute: 0.2 10*3/uL (ref 0.0–0.5)
Eosinophils Relative: 3 %
HCT: 38.6 % — ABNORMAL LOW (ref 39.0–52.0)
Hemoglobin: 11.9 g/dL — ABNORMAL LOW (ref 13.0–17.0)
Immature Granulocytes: 0 %
Lymphocytes Relative: 30 %
Lymphs Abs: 2.2 10*3/uL (ref 0.7–4.0)
MCH: 29 pg (ref 26.0–34.0)
MCHC: 30.8 g/dL (ref 30.0–36.0)
MCV: 94.1 fL (ref 80.0–100.0)
Monocytes Absolute: 0.7 10*3/uL (ref 0.1–1.0)
Monocytes Relative: 10 %
Neutro Abs: 3.9 10*3/uL (ref 1.7–7.7)
Neutrophils Relative %: 57 %
Platelets: 178 10*3/uL (ref 150–400)
RBC: 4.1 MIL/uL — ABNORMAL LOW (ref 4.22–5.81)
RDW: 11.9 % (ref 11.5–15.5)
WBC: 7.1 10*3/uL (ref 4.0–10.5)
nRBC: 0 % (ref 0.0–0.2)

## 2018-12-02 LAB — LIPID PANEL
Cholesterol: 144 mg/dL (ref 0–200)
HDL: 32 mg/dL — ABNORMAL LOW (ref >40)
LDL Cholesterol: 88 mg/dL (ref 0–99)
Total CHOL/HDL Ratio: 4.5 RATIO
Triglycerides: 120 mg/dL (ref <150)
VLDL: 24 mg/dL (ref 0–40)

## 2018-12-02 LAB — POCT I-STAT, CHEM 8
BUN: 16 mg/dL (ref 6–20)
Calcium, Ion: 1.2 mmol/L (ref 1.15–1.40)
Chloride: 102 mmol/L (ref 98–111)
Creatinine, Ser: 0.9 mg/dL (ref 0.61–1.24)
Glucose, Bld: 227 mg/dL — ABNORMAL HIGH (ref 70–99)
HCT: 39 % (ref 39.0–52.0)
Hemoglobin: 13.3 g/dL (ref 13.0–17.0)
Potassium: 3.6 mmol/L (ref 3.5–5.1)
Sodium: 140 mmol/L (ref 135–145)
TCO2: 22 mmol/L (ref 22–32)

## 2018-12-02 LAB — CBC
HCT: 46.3 % (ref 39.0–52.0)
Hemoglobin: 14.7 g/dL (ref 13.0–17.0)
MCH: 28.8 pg (ref 26.0–34.0)
MCHC: 31.7 g/dL (ref 30.0–36.0)
MCV: 90.8 fL (ref 80.0–100.0)
Platelets: 235 10*3/uL (ref 150–400)
RBC: 5.1 MIL/uL (ref 4.22–5.81)
RDW: 11.8 % (ref 11.5–15.5)
WBC: 13.6 10*3/uL — ABNORMAL HIGH (ref 4.0–10.5)
nRBC: 0 % (ref 0.0–0.2)

## 2018-12-02 LAB — COMPREHENSIVE METABOLIC PANEL
ALT: 30 U/L (ref 0–44)
AST: 34 U/L (ref 15–41)
Albumin: 4.4 g/dL (ref 3.5–5.0)
Alkaline Phosphatase: 77 U/L (ref 38–126)
Anion gap: 11 (ref 5–15)
BUN: 15 mg/dL (ref 6–20)
CO2: 26 mmol/L (ref 22–32)
Calcium: 9.1 mg/dL (ref 8.9–10.3)
Chloride: 101 mmol/L (ref 98–111)
Creatinine, Ser: 1.16 mg/dL (ref 0.61–1.24)
GFR calc Af Amer: >60 mL/min (ref >60)
GFR calc non Af Amer: >60 mL/min (ref >60)
Glucose, Bld: 202 mg/dL — ABNORMAL HIGH (ref 70–99)
Potassium: 4.2 mmol/L (ref 3.5–5.1)
Sodium: 138 mmol/L (ref 135–145)
Total Bilirubin: 1.2 mg/dL (ref 0.3–1.2)
Total Protein: 6.8 g/dL (ref 6.5–8.1)

## 2018-12-02 LAB — POCT ACTIVATED CLOTTING TIME
Activated Clotting Time: 186 seconds
Activated Clotting Time: 219 seconds
Activated Clotting Time: 235 seconds
Activated Clotting Time: 235 seconds
Activated Clotting Time: 246 seconds

## 2018-12-02 LAB — HEMOGLOBIN A1C
Hgb A1c MFr Bld: 5.8 % — ABNORMAL HIGH (ref 4.8–5.6)
Mean Plasma Glucose: 119.76 mg/dL

## 2018-12-02 LAB — TSH: TSH: 1.404 u[IU]/mL (ref 0.350–4.500)

## 2018-12-02 LAB — PROTIME-INR
INR: 1 (ref 0.8–1.2)
Prothrombin Time: 13.3 seconds (ref 11.4–15.2)

## 2018-12-02 LAB — APTT: aPTT: 21 seconds — ABNORMAL LOW (ref 24–36)

## 2018-12-02 LAB — TROPONIN I (HIGH SENSITIVITY)
Troponin I (High Sensitivity): 111 ng/L (ref <18)
Troponin I (High Sensitivity): >27000 ng/L (ref <18)
Troponin I (High Sensitivity): >27000 ng/L (ref <18)
Troponin I (High Sensitivity): >27000 ng/L (ref <18)

## 2018-12-02 LAB — MRSA PCR SCREENING: MRSA by PCR: NEGATIVE

## 2018-12-02 LAB — ECHOCARDIOGRAM COMPLETE

## 2018-12-02 LAB — SARS CORONAVIRUS 2 BY RT PCR (HOSPITAL ORDER, PERFORMED IN ~~LOC~~ HOSPITAL LAB): SARS Coronavirus 2: NEGATIVE

## 2018-12-02 SURGERY — CORONARY/GRAFT ACUTE MI REVASCULARIZATION
Anesthesia: LOCAL

## 2018-12-02 MED ORDER — HEPARIN SODIUM (PORCINE) 1000 UNIT/ML IJ SOLN
INTRAMUSCULAR | Status: DC | PRN
  Administered 2018-12-02: 3000 [IU] via INTRAVENOUS
  Administered 2018-12-02 (×2): 5000 [IU] via INTRAVENOUS

## 2018-12-02 MED ORDER — FUROSEMIDE 10 MG/ML IJ SOLN
INTRAMUSCULAR | Status: DC | PRN
  Administered 2018-12-02: 40 mg via INTRAVENOUS

## 2018-12-02 MED ORDER — FENTANYL CITRATE (PF) 100 MCG/2ML IJ SOLN
INTRAMUSCULAR | Status: AC
  Filled 2018-12-02: qty 2

## 2018-12-02 MED ORDER — SODIUM CHLORIDE 0.9% FLUSH: 3.0000 mL | INTRAVENOUS | Status: DC | PRN

## 2018-12-02 MED ORDER — CARVEDILOL 3.125 MG PO TABS
3.1250 mg | ORAL_TABLET | Freq: Two times a day (BID) | ORAL | Status: DC
  Administered 2018-12-02 – 2018-12-03 (×3): 3.125 mg via ORAL
  Filled 2018-12-02 (×3): qty 1

## 2018-12-02 MED ORDER — NITROGLYCERIN 1 MG/10 ML FOR IR/CATH LAB
INTRA_ARTERIAL | Status: DC | PRN
  Administered 2018-12-02: 200 ug via INTRACORONARY

## 2018-12-02 MED ORDER — POTASSIUM CHLORIDE CRYS ER 20 MEQ PO TBCR
20.0000 meq | EXTENDED_RELEASE_TABLET | Freq: Two times a day (BID) | ORAL | Status: AC
  Administered 2018-12-02 (×2): 20 meq via ORAL
  Filled 2018-12-02 (×2): qty 1

## 2018-12-02 MED ORDER — NOREPINEPHRINE 4 MG/250ML-% IV SOLN: 2.0000 ug/min | INTRAVENOUS | Status: DC

## 2018-12-02 MED ORDER — MORPHINE SULFATE (PF) 2 MG/ML IV SOLN: 2.0000 mg | INTRAVENOUS | Status: DC | PRN

## 2018-12-02 MED ORDER — NITROGLYCERIN 1 MG/10 ML FOR IR/CATH LAB
INTRA_ARTERIAL | Status: AC
  Filled 2018-12-02: qty 10

## 2018-12-02 MED ORDER — NITROGLYCERIN 0.4 MG SL SUBL
SUBLINGUAL_TABLET | SUBLINGUAL | Status: AC
  Filled 2018-12-02: qty 1

## 2018-12-02 MED ORDER — SODIUM CHLORIDE 0.9 % IV SOLN
INTRAVENOUS | Status: AC | PRN
  Administered 2018-12-02: 250 mL via INTRAVENOUS

## 2018-12-02 MED ORDER — HEPARIN (PORCINE) IN NACL 1000-0.9 UT/500ML-% IV SOLN
INTRAVENOUS | Status: AC
  Filled 2018-12-02: qty 1000

## 2018-12-02 MED ORDER — SODIUM CHLORIDE 0.9 % IV SOLN: INTRAVENOUS | Status: DC

## 2018-12-02 MED ORDER — TICAGRELOR 90 MG PO TABS
ORAL_TABLET | ORAL | Status: AC
  Filled 2018-12-02: qty 2

## 2018-12-02 MED ORDER — ASPIRIN 81 MG PO CHEW: 324.0000 mg | CHEWABLE_TABLET | Freq: Once | ORAL | Status: DC

## 2018-12-02 MED ORDER — NITROGLYCERIN 0.4 MG SL SUBL
0.4000 mg | SUBLINGUAL_TABLET | SUBLINGUAL | Status: DC | PRN
  Administered 2018-12-02: 0.4 mg via SUBLINGUAL

## 2018-12-02 MED ORDER — HEPARIN SODIUM (PORCINE) 1000 UNIT/ML IJ SOLN
INTRAMUSCULAR | Status: AC
  Filled 2018-12-02: qty 1

## 2018-12-02 MED ORDER — INFLUENZA VAC SPLIT QUAD 0.5 ML IM SUSY
0.5000 mL | PREFILLED_SYRINGE | INTRAMUSCULAR | Status: DC | PRN
  Filled 2018-12-02: qty 0.5

## 2018-12-02 MED ORDER — VERAPAMIL HCL 2.5 MG/ML IV SOLN
INTRAVENOUS | Status: AC
  Filled 2018-12-02: qty 2

## 2018-12-02 MED ORDER — HEPARIN SODIUM (PORCINE) 5000 UNIT/ML IJ SOLN
4000.0000 [IU] | Freq: Once | INTRAMUSCULAR | Status: AC
  Administered 2018-12-02: 4000 [IU] via INTRAVENOUS
  Filled 2018-12-02: qty 1

## 2018-12-02 MED ORDER — ORAL CARE MOUTH RINSE
15.0000 mL | Freq: Two times a day (BID) | OROMUCOSAL | Status: DC
  Administered 2018-12-03: 15 mL via OROMUCOSAL

## 2018-12-02 MED ORDER — ONDANSETRON HCL 4 MG/2ML IJ SOLN: 4.0000 mg | Freq: Four times a day (QID) | INTRAMUSCULAR | Status: DC | PRN

## 2018-12-02 MED ORDER — FUROSEMIDE 10 MG/ML IJ SOLN
INTRAMUSCULAR | Status: AC
  Filled 2018-12-02: qty 4

## 2018-12-02 MED ORDER — ACETAMINOPHEN 325 MG PO TABS: 650.0000 mg | ORAL_TABLET | ORAL | Status: DC | PRN

## 2018-12-02 MED ORDER — NOREPINEPHRINE 4 MG/250ML-% IV SOLN
INTRAVENOUS | Status: AC
  Filled 2018-12-02: qty 250

## 2018-12-02 MED ORDER — SODIUM CHLORIDE 0.9% FLUSH
3.0000 mL | Freq: Two times a day (BID) | INTRAVENOUS | Status: DC
  Administered 2018-12-02 – 2018-12-03 (×2): 3 mL via INTRAVENOUS

## 2018-12-02 MED ORDER — FUROSEMIDE 10 MG/ML IJ SOLN
40.0000 mg | Freq: Once | INTRAMUSCULAR | Status: AC
  Administered 2018-12-02: 40 mg via INTRAVENOUS
  Filled 2018-12-02: qty 4

## 2018-12-02 MED ORDER — VERAPAMIL HCL 2.5 MG/ML IV SOLN
INTRAVENOUS | Status: DC | PRN
  Administered 2018-12-02: 10 mL via INTRA_ARTERIAL

## 2018-12-02 MED ORDER — CHLORHEXIDINE GLUCONATE CLOTH 2 % EX PADS: 6.0000 | MEDICATED_PAD | Freq: Every day | CUTANEOUS | Status: DC

## 2018-12-02 MED ORDER — SODIUM CHLORIDE 0.9 % IV SOLN: INTRAVENOUS | Status: AC

## 2018-12-02 MED ORDER — LIDOCAINE HCL (PF) 1 % IJ SOLN
INTRAMUSCULAR | Status: AC
  Filled 2018-12-02: qty 30

## 2018-12-02 MED ORDER — LIDOCAINE HCL (PF) 1 % IJ SOLN
INTRAMUSCULAR | Status: DC | PRN
  Administered 2018-12-02: 2 mL via INTRADERMAL

## 2018-12-02 MED ORDER — MIDAZOLAM HCL 2 MG/2ML IJ SOLN
INTRAMUSCULAR | Status: AC
  Filled 2018-12-02: qty 2

## 2018-12-02 MED ORDER — NOREPINEPHRINE BITARTRATE 1 MG/ML IV SOLN
INTRAVENOUS | Status: AC | PRN
  Administered 2018-12-02: 10 ug/min via INTRAVENOUS

## 2018-12-02 MED ORDER — SODIUM CHLORIDE 0.9 % IV SOLN: 250.0000 mL | INTRAVENOUS | Status: DC | PRN

## 2018-12-02 MED ORDER — FENTANYL CITRATE (PF) 100 MCG/2ML IJ SOLN
INTRAMUSCULAR | Status: DC | PRN
  Administered 2018-12-02 (×2): 25 ug via INTRAVENOUS

## 2018-12-02 MED ORDER — ASPIRIN 81 MG PO CHEW
81.0000 mg | CHEWABLE_TABLET | Freq: Every day | ORAL | Status: DC
  Administered 2018-12-02 – 2018-12-04 (×3): 81 mg via ORAL
  Filled 2018-12-02 (×3): qty 1

## 2018-12-02 MED ORDER — MIDAZOLAM HCL 2 MG/2ML IJ SOLN
INTRAMUSCULAR | Status: DC | PRN
  Administered 2018-12-02 (×2): 1 mg via INTRAVENOUS

## 2018-12-02 MED ORDER — ATORVASTATIN CALCIUM 80 MG PO TABS
80.0000 mg | ORAL_TABLET | Freq: Every day | ORAL | Status: DC
  Administered 2018-12-02 – 2018-12-03 (×2): 80 mg via ORAL
  Filled 2018-12-02 (×2): qty 1

## 2018-12-02 MED ORDER — TICAGRELOR 90 MG PO TABS
ORAL_TABLET | ORAL | Status: DC | PRN
  Administered 2018-12-02: 180 mg via ORAL

## 2018-12-02 MED ORDER — TICAGRELOR 90 MG PO TABS
90.0000 mg | ORAL_TABLET | Freq: Two times a day (BID) | ORAL | Status: DC
  Administered 2018-12-02 – 2018-12-04 (×5): 90 mg via ORAL
  Filled 2018-12-02 (×5): qty 1

## 2018-12-02 SURGICAL SUPPLY — 20 items
BALLN SAPPHIRE 2.5X12 (BALLOONS) ×2
BALLN SAPPHIRE ~~LOC~~ 3.5X18 (BALLOONS) ×1 IMPLANT
BALLOON SAPPHIRE 2.5X12 (BALLOONS) IMPLANT
CATH INFINITI 5FR ANG PIGTAIL (CATHETERS) ×1 IMPLANT
CATH OPTITORQUE TIG 4.0 5F (CATHETERS) ×1 IMPLANT
CATH SWAN GANZ VIP 7.5F (CATHETERS) ×1 IMPLANT
CATH VISTA GUIDE 6FR XBLAD3.5 (CATHETERS) ×1 IMPLANT
DEVICE RAD COMP TR BAND LRG (VASCULAR PRODUCTS) ×1 IMPLANT
GLIDESHEATH SLEND SS 6F .021 (SHEATH) ×1 IMPLANT
GUIDEWIRE INQWIRE 1.5J.035X260 (WIRE) IMPLANT
INQWIRE 1.5J .035X260CM (WIRE) ×2
KIT ENCORE 26 ADVANTAGE (KITS) ×1 IMPLANT
KIT HEART LEFT (KITS) ×2 IMPLANT
PACK CARDIAC CATHETERIZATION (CUSTOM PROCEDURE TRAY) ×2 IMPLANT
SHEATH PINNACLE 7F 10CM (SHEATH) ×1 IMPLANT
STENT RESOLUTE ONYX 3.0X22 (Permanent Stent) ×1 IMPLANT
SYR MEDRAD MARK 7 150ML (SYRINGE) ×2 IMPLANT
TRANSDUCER W/STOPCOCK (MISCELLANEOUS) ×2 IMPLANT
TUBING CIL FLEX 10 FLL-RA (TUBING) ×2 IMPLANT
WIRE ASAHI PROWATER 180CM (WIRE) ×1 IMPLANT

## 2018-12-02 NOTE — H&P (Signed)
Cardiology Admission History and Physical:   Patient ID: Zachary Flynn MRN: [id number]; DOB: 20-Apr-1960   Admission date: 12/02/2018  Primary Care Provider: Lawerance Cruel, MD Primary Cardiologist: No primary care provider on file.  New to [facility] Primary Electrophysiologist:  None   Chief Complaint: Anterior STEMI  Patient Profile:   Zachary Flynn is a 58 y.o. male with history of hypertension hyper lipidemia as well as family history of CAD who presents to the emergency room via EMS with roughly 1/2 hours of ongoing chest pain that began roughly 45 minutes before EMS arrival.  EKG by EMS showed anterior ST elevations also seen here in the ER.  Code STEMI was called in route.  History of Present Illness:   Zachary Flynn has noted about on 3-4 episodes of intermittent chest discomfort mostly exertional while doing yard work over the last 2 weeks.  He has not had any resting symptoms until tonight when he was try to lay down in the evening of 12/01/2018.  He was unable to really get comfortable and roughly 40 minutes prior to EMS arrival (roughly 1/2 hours prior to ER arrival) he started noticing pain that began behind his shoulder blades and then radiated to the front of his chest.  Also had some numbness and tingling in his hands.  Pain was 9 out of 10 at baseline went down to 5 out of 10 with subtle nitroglycerin and aspirin by EMS.  Is roughly 4-5 out of 10 chest pain upon arrival to ER.  Cardiovascular ROS: positive for - chest pain, shortness of breath and Some nausea but no vomiting negative for - dyspnea on exertion, edema, irregular heartbeat, orthopnea, palpitations, paroxysmal nocturnal dyspnea, rapid heart rate or Syncope/near syncope or TIA/amaurosis fugax   Heart Pathway Score:     Past Medical History:  Diagnosis Date  . Essential hypertension 12/02/2018  . Hyperlipidemia with target LDL less than 70 12/02/2018   No documented surgical history Past Surgical  History:  Procedure Laterality Date  . CERVICAL SPINE SURGERY    . REVISION TOTAL HIP ARTHROPLASTY Left 2003  . TOTAL HIP ARTHROPLASTY Left 2000     Medications Prior to Admission: Prior to Admission medications   Medication Sig Start Date End Date Taking? Authorizing Provider  amLODipine-benazepril (LOTREL) 10-20 MG capsule Take 1 capsule by mouth daily.    [provider]  terbinafine (LAMISIL) 250 MG tablet Take 250 mg by mouth daily. Take daily for one week each month    [provider]     Allergies:    Allergies  Allergen Reactions  . Food     Lactose intolerant, no dairy products  . Pollen Extract     Social History:   Social History   Socioeconomic History  . Marital status: Married    Spouse name: Not on file  . Number of children: Not on file  . Years of education: Not on file  . Highest education level: Not on file  Occupational History  . Not on file  Social Needs  . Financial resource strain: Not on file  . Food insecurity    Worry: Not on file    Inability: Not on file  . Transportation needs    Medical: Not on file    Non-medical: Not on file  Tobacco Use  . Smoking status: Never Smoker  . Smokeless tobacco: Never Used  Substance and Sexual Activity  . Alcohol use: Not on file  . Drug use:  Not on file  . Sexual activity: Not on file  Lifestyle  . Physical activity    Days per week: Not on file    Minutes per session: Not on file  . Stress: Not on file  Relationships  . Social Herbalist on phone: Not on file    Gets together: Not on file    Attends religious service: Not on file    Active member of club or organization: Not on file    Attends meetings of clubs or organizations: Not on file    Relationship status: Not on file  . Intimate partner violence    Fear of current or ex partner: Not on file    Emotionally abused: Not on file    Physically abused: Not on file    Forced sexual activity: Not on file   Other Topics Concern  . Not on file  Social History Narrative  . Not on file    Family History: Basic review The patient's family history includes CAD in his mother and sister; Heart attack in his mother and sister.    ROS:  No antecedent fevers, chills, sweats, cough or acute dyspnea.  Has had some nausea but no vomiting tonight. No melena, hematochezia, hematuria, epistaxis All other ROS reviewed and negative.     Please see the history of present illness.   Physical Exam/Data:   Vitals:   12/02/18 0121  BP: 127/75  Pulse: (!) 58  Resp: 17  Temp: (!) 96.2 F (35.7 C)  TempSrc: Tympanic  SpO2: 96%   No intake or output data in the 24 hours ending 12/02/18 0144 No flowsheet data found.   There is no height or weight on file to calculate BMI.  General:  Well nourished, well developed, in mild to moderate HEENT: normal, /AT/EOMI Lymph: no adenopathy Neck: no obvious JVD Endocrine:  No thryomegaly Vascular: No carotid bruits; FA pulses 2+ bilaterally without bruits  Cardiac:  normal but distant S1, S2; RRR; no murmur or gallops. Lungs:  clear to auscultation bilaterally, no wheezing, rhonchi or rales  Abd: soft, nontender, no hepatomegaly  Ext: nO clubbing/cyanosis/edema Musculoskeletal:  No deformities, BUE and BLE strength normal and equal Skin: warm and dry  Neuro:  CNs 2-12 intact, no focal abnormalities noted Psych:  Normal affect    EKG:  The ECG that was done in the emergency room was personally reviewed and demonstrates sinus rhythm heart rate 70 bpm with incomplete RBBB left axis deviation (-57) with 1 to 3 mm elations in leads I and aVL.  2-3 mm durations in V2 with less prominent upsloping ST elevations in V3 through V5.  No reciprocal changes noted.  Relevant CV Studies: No prior studies  Laboratory Data:  High Sensitivity Troponin:  No results for input(s): TROPONINIHS in the last 720 hours.    ChemistryNo results for input(s): NA, K, CL, CO2,  GLUCOSE, BUN, CREATININE, CALCIUM, GFRNONAA, GFRAA, ANIONGAP in the last 168 hours.  No results for input(s): PROT, ALBUMIN, AST, ALT, ALKPHOS, BILITOT in the last 168 hours. Hematology Recent Labs  Lab 12/02/18 0126  WBC 7.1  RBC 4.10*  HGB 11.9*  HCT 38.6*  MCV 94.1  MCH 29.0  MCHC 30.8  RDW 11.9  PLT 178   BNPNo results for input(s): BNP, PROBNP in the last 168 hours.  DDimer No results for input(s): DDIMER in the last 168 hours.   Radiology/Studies:  No results found.  Assessment and Plan:   1.  Acute anterior STEMI--plan is for emergent catheterization and possible PCI  Most plans to be post cath-PCI  Check 2D echo  Check lipid panel 2. Has hypertension, will likely start beta-blocker and determine home medications prior to restarting. 3. We will convert to high-dose statin  Severity of Illness: The appropriate patient status for this patient is INPATIENT. Inpatient status is judged to be reasonable and necessary in order to provide the required intensity of service to ensure the patient's safety. The patient's presenting symptoms, physical exam findings, and initial radiographic and laboratory data in the context of their chronic comorbidities is felt to place them at high risk for further clinical deterioration. Furthermore, it is not anticipated that the patient will be medically stable for discharge from the hospital within 2 midnights of admission. The following factors support the patient status of inpatient.   " The patient's presenting symptoms include presentation with unstable angina process and EKG changes concerning for MI.. " The worrisome physical exam findings include patient is notably uncomfortable. " The initial radiographic and laboratory data are worrisome because of EKG with ST elevations in anterior lateral leads  " The chronic co-morbidities include hypertension, hyperlipidemia and family history.   * I certify that at the point of admission it  is my clinical judgment that the patient will require inpatient hospital care spanning beyond 2 midnights from the point of admission due to high intensity of service, high risk for further deterioration and high frequency of surveillance required.*    For questions or updates, please contact [facility] Please consult [email or web address] for contact info under        Signed, [name], MD  12/02/2018 1:44 AM

## 2018-12-02 NOTE — ED Provider Notes (Signed)
TIME SEEN: 1:22 AM  CHIEF COMPLAINT: chest pain  HPI: Patient is a 58 year old male with history of hypertension, hyperlipidemia, family history of CAD who presents to the emergency department with chest pain.  EKG sent by EMS concerning for anterior STEMI.  Chest pain started approximately 45 minutes prior to EMS arrival.  Patient did endorse shortness of breath, nausea.  No vomiting, dizziness or diaphoresis.  No recent fevers or cough.  Did have some pain between his shoulder blades and tingling in fingertips on both sides tonight.  Has had some exertional chest pain over the past 3 to 4 days.  Pain tonight occurred at rest.  Does not have a cardiologist.  ROS: See HPI Constitutional: no fever  Eyes: no drainage  ENT: no runny nose   Cardiovascular:   chest pain  Resp:  SOB  GI: no vomiting GU: no dysuria Integumentary: no rash  Allergy: no hives  Musculoskeletal: no leg swelling  Neurological: no slurred speech ROS otherwise negative  PAST MEDICAL HISTORY/PAST SURGICAL HISTORY:  No past medical history on file.  MEDICATIONS:  Prior to Admission medications   Medication Sig Start Date End Date Taking? Authorizing Provider  amLODipine-benazepril (LOTREL) 10-20 MG capsule Take 1 capsule by mouth daily.    [provider]  terbinafine (LAMISIL) 250 MG tablet Take 250 mg by mouth daily. Take daily for one week each month    [provider]    ALLERGIES:  Allergies  Allergen Reactions  . Food     Lactose intolerant, no dairy products  . Pollen Extract     SOCIAL HISTORY:  Social History   Tobacco Use  . Smoking status: Not on file  Substance Use Topics  . Alcohol use: Not on file    FAMILY HISTORY: No family history on file.  EXAM: BP 127/75   Pulse (!) 58   Temp (!) 96.2 F (35.7 C) (Tympanic)   Resp 17   SpO2 94%  CONSTITUTIONAL: Alert and oriented and responds appropriately to questions. Well-appearing; well-nourished HEAD:  Normocephalic EYES: Conjunctivae clear, pupils appear equal, EOMI ENT: normal nose; moist mucous membranes NECK: Supple, no meningismus, no nuchal rigidity, no LAD  CARD: RRR; S1 and S2 appreciated; no murmurs, no clicks, no rubs, no gallops RESP: Normal chest excursion without splinting or tachypnea; breath sounds clear and equal bilaterally; no wheezes, no rhonchi, no rales, no hypoxia or respiratory distress, speaking full sentences ABD/GI: Normal bowel sounds; non-distended; soft, non-tender, no rebound, no guarding, no peritoneal signs, no hepatosplenomegaly BACK:  The back appears normal and is non-tender to palpation, there is no CVA tenderness EXT: Normal ROM in all joints; non-tender to palpation; no edema; normal capillary refill; no cyanosis, no calf tenderness or swelling, 2+ radial and DP pulses bilaterally    SKIN: Normal color for age and race; warm; no rash NEURO: Moves all extremities equally PSYCH: The patient's mood and manner are appropriate. Grooming and personal hygiene are appropriate.  MEDICAL DECISION MAKING: Patient here with anterior STEMI.  STEMI paged while EMS was in route after my review of patient's initial EKG.  Patient received aspirin and 1 nitroglycerin with EMS.  Pain has improved slightly.  Currently hemodynamically stable.  We will continue nitroglycerin and give heparin bolus.  Dr. Ellyn Hack with cardiology at bedside upon patient's arrival.  Appreciate his help.  ED PROGRESS: Patient taken emergently to Cath Lab.  Zachary Flynn was evaluated in Emergency Department on 12/02/2018 for the symptoms described in the history  of present illness. He was evaluated in the context of the global COVID-19 pandemic, which necessitated consideration that the patient might be at risk for infection with the SARS-CoV-2 virus that causes COVID-19. Institutional protocols and algorithms that pertain to the evaluation of patients at risk for COVID-19 are in a state of rapid  change based on information released by regulatory bodies including the CDC and federal and state organizations. These policies and algorithms were followed during the patient's care in the ED.   CRITICAL CARE Performed by: Pryor Curia   Total critical care time: 35 minutes  Critical care time was exclusive of separately billable procedures and treating other patients.  Critical care was necessary to treat or prevent imminent or life-threatening deterioration.  Critical care was time spent personally by me on the following activities: development of treatment plan with patient and/or surrogate as well as nursing, discussions with consultants, evaluation of patient's response to treatment, examination of patient, obtaining history from patient or surrogate, ordering and performing treatments and interventions, ordering and review of laboratory studies, ordering and review of radiographic studies, pulse oximetry and re-evaluation of patient's condition.    EKG Interpretation  Date/Time:  Wednesday December 02 2018 01:20:28 EDT Ventricular Rate:  70 PR Interval:    QRS Duration: 106 QT Interval:  413 QTC Calculation: 446 R Axis:   -57 Text Interpretation:  Sinus rhythm Incomplete RBBB and LAFB Abnormal R-wave progression, early transition Probable left ventricular hypertrophy Anterior Q waves, possibly due to LVH Minimal ST elevation, lateral leads ST elevation in anterior leads that is new compared to previous Confirmed by Fenna Semel, Cyril Mourning [phone]) on 12/02/2018 1:22:41 AM         [name], Delice Bison, DO 12/02/18 [id number]

## 2018-12-02 NOTE — Progress Notes (Signed)
Engaged in initial visit with Mr. Lindert, and told him and his family member in the room about spiritual support offered.  Mr. Garbacz expressed happiness in being able to possibly leave by Saturday, but also expressed some frustration with having to be more still in the hospital bed.  He said he could not wait to be able to get up and walk tomorrow because he is usually a workaholic. Both chaplain and Mr. Albrecht noted that it was a blessing to be able to leave soon and to be able and get up and walk the next day.   Chaplain let Mr. Harrow know to have nurse page her if needed.

## 2018-12-02 NOTE — Progress Notes (Addendum)
Progress Note  Patient Name: Zachary Flynn Date of Encounter: 12/02/2018  Primary Cardiologist: No primary care provider on file.   Subjective   Minimal chest pain this morning.  Feels "congested" in my chest, cough.  Inpatient Medications    Scheduled Meds: . aspirin  81 mg Oral Daily  . atorvastatin  80 mg Oral q1800  . Chlorhexidine Gluconate Cloth  6 each Topical Daily  . mouth rinse  15 mL Mouth Rinse BID  . nitroGLYCERIN      . sodium chloride flush  3 mL Intravenous Q12H  . ticagrelor  90 mg Oral BID   Continuous Infusions: . sodium chloride 75 mL/hr at 12/02/18 0700  . sodium chloride    . norepinephrine (LEVOPHED) Adult infusion 2 mcg/min (12/02/18 0700)   PRN Meds: sodium chloride, acetaminophen, morphine injection, nitroGLYCERIN, ondansetron (ZOFRAN) IV, sodium chloride flush   Vital Signs    Vitals:   12/02/18 0645 12/02/18 0700 12/02/18 0715 12/02/18 0730  BP: 123/69 (!) 136/95 130/76 125/84  Pulse:      Resp: 14 (!) 21 (!) 30 (!) 29  Temp:      TempSrc:      SpO2: 99% 98% 97% 97%    Intake/Output Summary (Last 24 hours) at 12/02/2018 0810 Last data filed at 12/02/2018 0700 Gross per 24 hour  Intake 403.69 ml  Output 1500 ml  Net -1096.31 ml   No flowsheet data found.    Telemetry    Normal sinus rhythm with rare PVC- Personally Reviewed  ECG    Normal sinus rhythm, evolving changes of acute anteroseptal infarction - Personally Reviewed  Physical Exam  Alert, oriented, in no distress GEN: No acute distress.   Neck: No JVD Cardiac: RRR, no murmurs, rubs, or gallops.  Respiratory: Clear to auscultation bilaterally. GI: Soft, nontender, non-distended  MS: No edema; No deformity.  TR band is in place.  There is a moderate right forearm hematoma present. Neuro:  Nonfocal  Psych: Normal affect   Labs    High Sensitivity Troponin:   Recent Labs  Lab 12/02/18 0126 12/02/18 0436 12/02/18 0633  TROPONINIHS 111* >27,000* >27,000*    Chemistry Recent Labs  Lab 12/02/18 0126 12/02/18 0436  NA 138 140  K 4.2 3.4*  CL 101 99  CO2 26 22  GLUCOSE 202* 202*  BUN 15 15  CREATININE 1.16 1.08  CALCIUM 9.1 9.1  PROT 6.8  --   ALBUMIN 4.4  --   AST 34  --   ALT 30  --   ALKPHOS 77  --   BILITOT 1.2  --   GFRNONAA >60 >60  GFRAA >60 >60  ANIONGAP 11 19*     Hematology Recent Labs  Lab 12/02/18 0126 12/02/18 0436  WBC 7.1 13.6*  RBC 4.10* 5.10  HGB 11.9* 14.7  HCT 38.6* 46.3  MCV 94.1 90.8  MCH 29.0 28.8  MCHC 30.8 31.7  RDW 11.9 11.8  PLT 178 235    BNPNo results for input(s): BNP, PROBNP in the last 168 hours.   DDimer No results for input(s): DDIMER in the last 168 hours.   Radiology    No results found.  Cardiac Studies   Cardiac Cath: Conclusion    Prox LAD to Mid LAD lesion is 100% stenosed with 50% stenosed side branch in 1st Diag.  Post intervention, there is a 0% residual stenosis.  Post intervention, the side branch was reduced to 55% residual stenosis.  A drug-eluting stent was  successfully placed using a STENT RESOLUTE ONYX 3.0X22.  Ramus lesion is 55% stenosed.  The left ventricular systolic function is normal.  LV end diastolic pressure is severely elevated.  The left ventricular ejection fraction is 55-65% by visual estimate.  There is no mitral valve regurgitation.  There is no aortic valve stenosis.   SUMMARY -> ACUTE ANTERIOR STEMI INVOLVING THE LAD  Severe single-vessel CAD with 100% proximal LAD occlusion and left dominant system --> successful DES PCI-Resolute Onyx DES 3.0 mm x 32 mm (3.5-3.1 mm)  Moderate RI stenosis, not flow-limiting.  Preserved EF post PCI with severely elevated LVEDP-ACUTE DIASTOLIC HEART FAILURE  Borderline Cardiogenic Shock -> stable on modest dose Levophed   RECOMMENDATIONS  Admit to CCU overnight.  Wean Levophed off as blood pressure tolerates  1 dose of the Lasix given in the Cath Lab, reassess in the morning.   Check 2D echo  Hold off on beta-blocker/ARB pending blood pressure stabilization  Check lipid panel, A1c and TSH  High-dose statin ordered  DAPT per recommendations noted elsewhere  Likely not fast-track discharge, but would anticipate discharge in roughly 3 days.   Diagnostic Dominance: Left Left Main  Vessel is large.  Left Anterior Descending  Prox LAD to Mid LAD lesion 100% stenosed with side branch in 1st Diag 50% stenosed  Prox LAD to Mid LAD lesion is 100% stenosed with 50% stenosed side branch in 1st Diag. Vessel is the culprit lesion. The lesion is type C, located proximal to the major branch, segmental, thrombotic and ulcerative.  First Diagonal Branch  Vessel is small in size.  First Septal Branch  Vessel is small in size.  Second Diagonal Branch  Ramus Intermedius  Vessel is large in size  Ramus lesion 55% stenosed  Ramus lesion is 55% stenosed. The lesion is tubular, concentric and smooth.  Lateral Ramus Intermedius  Vessel is small in size.  Left Circumflex  Vessel is large. Vessel is angiographically normal.  First Obtuse Marginal Branch  Vessel is small in size.  Second Obtuse Marginal Branch  Vessel is small in size.  Third Obtuse Marginal Branch  Vessel is small in size.  First Left Posterolateral Branch  Vessel is small in size.  Second Left Posterolateral Branch  Vessel is small in size.  Left Posterior Atrioventricular Artery  Vessel is angiographically normal.  Right Coronary Artery  Vessel is small.  Intervention  Prox LAD to Mid LAD lesion with side branch in 1st Diag  Stent - Main Branch  Lesion length: 20 mm. CATH VISTA GUIDE 6FR XBLAD3.5 guide catheter was inserted. Lesion crossed with guidewire using a WIRE ASAHI PROWATER 180CM. Pre-stent angioplasty was performed using a BALLOON SAPPHIRE 2.5X12. Maximum pressure: 10 atm. Inflation time: 20 sec. A drug-eluting stent was successfully placed using a STENT RESOLUTE ONYX 3.0X22. Maximum  pressure: 14 atm. Inflation time: 30 sec. Minimum lumen area: 3.5 mm. Stent strut is well apposed. Post-stent angioplasty was performed using a BALLOON SAPPHIRE [place] 3.5X18. Maximum pressure: 14 atm. Inflation time: 20 sec. Proximal 2/3 of stent There was slight spasm just distal to the stent. Resolved with IC nitroglycerin.  Post-Intervention Lesion Assessment  The intervention was successful. Pre-interventional TIMI flow is 0. Post-intervention TIMI flow is 3. Treated lesion length: 22 mm. No complications occurred at this lesion.  There is a 0% residual stenosis in the main branch post intervention.  There is a 55% residual stenosis in the side branch post intervention.  Wall Motion  Resting  Initial angiography suggested significant anterior hypokinesis, after the wall motion abnormality appeared to resolve          Left Heart  Left Ventricle The left ventricular size is normal. The left ventricular systolic function is normal. LV end diastolic pressure is severely elevated. Initial EDP was 17, following to 50 mm bolus, it went up to 29 mmHg The left ventricular ejection fraction is 55-65% by visual estimate. No regional wall motion abnormalities. There is no evidence of mitral regurgitation.  Aortic Valve There is no aortic valve stenosis. There is normal aortic valve motion.  Coronary Diagrams  Diagnostic Dominance: Left  Intervention   Implants   Permanent Stent  Stent Resolute Onyx 3.0x22 - [id number] - Implanted    Patient Profile     58 y.o. male presenting with acute anterior MI, treated with primary PCI of the LAD  Assessment & Plan    1.  Acute anterior STEMI: Complicated by cardiogenic shock.  The patient has had a very good early response to reperfusion therapy with stenting of the LAD.  He is on very low-dose Levophed this morning and this will be discontinued as his mean arterial pressure is currently greater than 70 mmHg.  He will continue on aspirin and  ticagrelor as well as a high intensity statin drug.  2.  Acute diastolic heart failure: Secondary to #1.  Noted to have severely elevated LVEDP at cardiac catheterization.  Will diurese with IV Lasix this morning.  Repeat metabolic panel tomorrow.  We will slowly add an a beta-blocker and ACE inhibitor.  3.  Mixed hyperlipidemia: LDL cholesterol goal less than 70, labs demonstrate a total cholesterol of 144, HDL 32, LDL 88.  The patient is started on a high intensity statin drug with atorvastatin 80 mg.  Disposition: Stable in the early post MI.  Telemetry reviewed with few PVCs.  Plan as outlined above, turn off Levophed, gently diurese, start low-dose carvedilol.  Keep in CCU today.  Time spent conducting this evaluation: 30 minutes  For questions or updates, please contact [place] Please consult [email or web address] for contact info under     Signed, Sherren Mocha, MD  12/02/2018, 8:10 AM

## 2018-12-02 NOTE — Plan of Care (Signed)
  Problem: Activity: Goal: Ability to return to baseline activity level will improve Outcome: Progressing   Problem: Cardiovascular: Goal: Ability to achieve and maintain adequate cardiovascular perfusion will improve Outcome: Progressing Goal: Vascular access site(s) Level 0-1 will be maintained Outcome: Progressing   Problem: Education: Goal: Knowledge of General Education information will improve Description: Including pain rating scale, medication(s)/side effects and non-pharmacologic comfort measures Outcome: Progressing   Problem: Clinical Measurements: Goal: Ability to maintain clinical measurements within normal limits will improve Outcome: Progressing Goal: Will remain free from infection Outcome: Progressing Goal: Respiratory complications will improve Outcome: Progressing Goal: Cardiovascular complication will be avoided Outcome: Progressing   Problem: Activity: Goal: Risk for activity intolerance will decrease Outcome: Progressing   Problem: Nutrition: Goal: Adequate nutrition will be maintained Outcome: Progressing   Problem: Coping: Goal: Level of anxiety will decrease Outcome: Progressing   Problem: Elimination: Goal: Will not experience complications related to bowel motility Outcome: Progressing Goal: Will not experience complications related to urinary retention Outcome: Progressing   Problem: Pain Managment: Goal: General experience of comfort will improve Outcome: Progressing   Problem: Safety: Goal: Ability to remain free from injury will improve Outcome: Progressing   Problem: Skin Integrity: Goal: Risk for impaired skin integrity will decrease Outcome: Progressing

## 2018-12-02 NOTE — Care Management (Signed)
12-02-18 CM received consult for Brilinta.  Benefits Check submitted for Brilinta. CM will make patient aware of cost once completed. Bethena Roys, RN,BSN Case Manager [phone]

## 2018-12-02 NOTE — Progress Notes (Signed)
  Echocardiogram 2D Echocardiogram has been performed.  Zachary Flynn 12/02/2018, 8:41 AM

## 2018-12-02 NOTE — TOC Benefit Eligibility Note (Signed)
Transition of Care [facility]) Benefit Eligibility Note    Patient Details  Name: Zachary Flynn MRN: [id number] Date of Birth: Jul 27, 1960   Medication/Dose: Brilinta 90 mg BID  Covered?: Yes  Tier: 3 Drug  Prescription Coverage Preferred Pharmacy: any retail pharmacy  Spoke with Person/Company/Phone Number:: [facility] [phone]  Co-Pay: 30 day retail $50/ 90 day mail $100  Prior Approval: No          Louann Phone Number: 12/02/2018, 2:22 PM

## 2018-12-02 NOTE — ED Notes (Signed)
Chest pain gone, pt to back

## 2018-12-02 NOTE — TOC Benefit Eligibility Note (Signed)
Transition of Care [facility]) Benefit Eligibility Note    Patient Details  Name: Zachary Flynn MRN: [id number] Date of Birth: 08/04/1960   Medication/Dose: BRILINTA 90 MG BID  Covered?: Yes  Tier: 3 Drug  Prescription Coverage [facility]erred [facility]acy: [facility]  90 DAY SUPPLY FOR M/O $100.00  Spoke with Person/Company/Phone Number:: LYNN @ [facility] # [phone]  Co-Pay: $ 50.00  Prior Approval: No  Deductible: Met(MAX OUT -OF-POCKET: NOT MET)       Memory Argue Phone Number: 12/02/2018, 3:16 PM

## 2018-12-02 NOTE — ED Triage Notes (Signed)
Per EMs, pt was trying to sleep, had chest pain from back to sternum.  Given 324 ASA and one nitro, pain decreased from 6-5, bp did not decrease.  No history.

## 2018-12-02 NOTE — Care Management (Signed)
[id number] 12-02-18 Patient is aware of co pay cost for Brilinta and CM provided him with discount card. Patient is agreeable to use the [facility] for all medications so he can get the Brilinta 30 day free. Patient uses [facility] and [facility] outpatient. No further transition of care needs identified at this time. Bethena Roys, RN,BSN Case Manager [phone]

## 2018-12-03 DIAGNOSIS — I2102 ST elevation (STEMI) myocardial infarction involving left anterior descending coronary artery: Principal | ICD-10-CM

## 2018-12-03 DIAGNOSIS — R570 Cardiogenic shock: Secondary | ICD-10-CM

## 2018-12-03 HISTORY — DX: Cardiogenic shock: R57.0

## 2018-12-03 LAB — BASIC METABOLIC PANEL
Anion gap: 6 (ref 5–15)
BUN: 12 mg/dL (ref 6–20)
CO2: 27 mmol/L (ref 22–32)
Calcium: 9 mg/dL (ref 8.9–10.3)
Chloride: 106 mmol/L (ref 98–111)
Creatinine, Ser: 1 mg/dL (ref 0.61–1.24)
GFR calc Af Amer: >60 mL/min (ref >60)
GFR calc non Af Amer: >60 mL/min (ref >60)
Glucose, Bld: 157 mg/dL — ABNORMAL HIGH (ref 70–99)
Potassium: 3.8 mmol/L (ref 3.5–5.1)
Sodium: 139 mmol/L (ref 135–145)

## 2018-12-03 MED ORDER — LISINOPRIL 10 MG PO TABS
10.0000 mg | ORAL_TABLET | Freq: Every day | ORAL | Status: DC
  Administered 2018-12-03 – 2018-12-04 (×2): 10 mg via ORAL
  Filled 2018-12-03 (×2): qty 1

## 2018-12-03 MED ORDER — CARVEDILOL 6.25 MG PO TABS
6.2500 mg | ORAL_TABLET | Freq: Two times a day (BID) | ORAL | Status: DC
  Administered 2018-12-03 – 2018-12-04 (×2): 6.25 mg via ORAL
  Filled 2018-12-03 (×2): qty 1

## 2018-12-03 MED FILL — Heparin Sod (Porcine)-NaCl IV Soln 1000 Unit/500ML-0.9%: INTRAVENOUS | Qty: 1000 | Status: AC

## 2018-12-03 NOTE — Progress Notes (Signed)
CARDIAC REHAB PHASE I   PRE:  Rate/Rhythm: 85 SR  BP:  Supine:   Sitting: 146/98  Standing:    SaO2: 98%RA  MODE:  Ambulation: 800 ft   POST:  Rate/Rhythm: 105 ST  BP:  Supine:   Sitting: 139/100, 158/87  Standing:    SaO2: 98%RA 1045-1143 Pt walked 800 ft on RA with steady gait and tolerated well. No CP. MI education completed with pt and wife who voiced understanding. Stressed importance of brilinta with stent. Reviewed NTG use, MI restrictions, heart healthy and low carb food choices, walking for ex, CRP 2. Referred to [place] CRP 2.  Pt is interested in participating in Virtual Cardiac and Pulmonary Rehab. Pt advised that Virtual Cardiac and Pulmonary Rehab is provided at no cost to the patient.  Checklist:  1. Pt has smart device  ie smartphone and/or ipad for downloading an app  Yes 2. Reliable internet/wifi service    Yes 3. Understands how to use their smartphone and navigate within an app.  Yes   Pt verbalized understanding and is in agreement.   Graylon Good, RN BSN  12/03/2018 11:39 AM

## 2018-12-03 NOTE — Progress Notes (Signed)
Progress Note  Patient Name: Zachary Flynn Date of Encounter: 12/03/2018  Primary Cardiologist: No primary care provider on file.   Subjective   Feeling much better today.  No chest pain or shortness of breath.  Inpatient Medications    Scheduled Meds: . aspirin  81 mg Oral Daily  . atorvastatin  80 mg Oral q1800  . carvedilol  3.125 mg Oral BID WC  . Chlorhexidine Gluconate Cloth  6 each Topical Daily  . mouth rinse  15 mL Mouth Rinse BID  . sodium chloride flush  3 mL Intravenous Q12H  . ticagrelor  90 mg Oral BID   Continuous Infusions: . sodium chloride     PRN Meds: sodium chloride, acetaminophen, influenza vac split quadrivalent PF, morphine injection, nitroGLYCERIN, ondansetron (ZOFRAN) IV, sodium chloride flush   Vital Signs    Vitals:   12/03/18 0500 12/03/18 0600 12/03/18 0700 12/03/18 0744  BP: 122/70 134/71 (!) 159/91   Pulse:      Resp:      Temp:    98.9 F (37.2 C)  TempSrc:    Oral  SpO2: 100% 98% 100%   Weight:      Height:        Intake/Output Summary (Last 24 hours) at 12/03/2018 0746 Last data filed at 12/03/2018 0700 Gross per 24 hour  Intake 413.36 ml  Output 1425 ml  Net -1011.64 ml   Last 3 Weights 12/02/2018  Weight (lbs) 185 lb 10 oz  Weight (kg) 84.2 kg      Telemetry    Sinus rhythm, rare PVC, 1 run of NSVT total of 5 beats- Personally Reviewed   Physical Exam  Alert, oriented, no distress GEN: No acute distress.   Neck: No JVD Cardiac: RRR, no murmurs, rubs, or gallops.  Respiratory: Clear to auscultation bilaterally. GI: Soft, nontender, non-distended  MS: No edema; No deformity.  Right forearm hematoma improving, softer today Neuro:  Nonfocal  Psych: Normal affect   Labs    High Sensitivity Troponin:   Recent Labs  Lab 12/02/18 0126 12/02/18 0436 12/02/18 0633 12/02/18 1003  TROPONINIHS 111* >27,000* >27,000* >27,000*      Chemistry Recent Labs  Lab 12/02/18 0126 12/02/18 0203 12/02/18 0436  12/03/18 0505  NA 138 140 140 139  K 4.2 3.6 3.4* 3.8  CL 101 102 99 106  CO2 26  --  22 27  GLUCOSE 202* 227* 202* 157*  BUN 15 16 15 12   CREATININE 1.16 0.90 1.08 1.00  CALCIUM 9.1  --  9.1 9.0  PROT 6.8  --   --   --   ALBUMIN 4.4  --   --   --   AST 34  --   --   --   ALT 30  --   --   --   ALKPHOS 77  --   --   --   BILITOT 1.2  --   --   --   GFRNONAA >60  --  >60 >60  GFRAA >60  --  >60 >60  ANIONGAP 11  --  19* 6     Hematology Recent Labs  Lab 12/02/18 0126 12/02/18 0203 12/02/18 0436  WBC 7.1  --  13.6*  RBC 4.10*  --  5.10  HGB 11.9* 13.3 14.7  HCT 38.6* 39.0 46.3  MCV 94.1  --  90.8  MCH 29.0  --  28.8  MCHC 30.8  --  31.7  RDW 11.9  --  11.8  PLT 178  --  235    BNPNo results for input(s): BNP, PROBNP in the last 168 hours.   DDimer No results for input(s): DDIMER in the last 168 hours.   Radiology    No results found.  Cardiac Studies   2D echocardiogram 12/02/2018: IMPRESSIONS    1. Left ventricular ejection fraction, by visual estimation, is 50 to 55%. The left ventricle has mildly decreased function. Normal left ventricular size. There is moderately increased left ventricular hypertrophy. Apical septal and apical anterior  akinesis.  2. Left ventricular diastolic Doppler parameters are consistent with impaired relaxation pattern of LV diastolic filling.  3. Global right ventricle has normal systolic function.The right ventricular size is normal. No increase in right ventricular wall thickness.  4. Left atrial size was normal.  5. Right atrial size was normal.  6. The mitral valve is normal in structure. No evidence of mitral valve regurgitation. No evidence of mitral stenosis.  7. The tricuspid valve is normal in structure. Tricuspid valve regurgitation is trivial.  8. The aortic valve is tricuspid Aortic valve regurgitation was not visualized by color flow Doppler. Structurally normal aortic valve, with no evidence of sclerosis or  stenosis.  9. There is mild dilatation of the ascending aorta measuring 39 mm. 10. The inferior vena cava is normal in size with greater than 50% respiratory variability, suggesting right atrial pressure of 3 mmHg. 11. TR signal is inadequate for assessing pulmonary artery systolic pressure.  Patient Profile     58 y.o. male presenting with acute anterior MI, treated with primary PCI of the LAD  Assessment & Plan    1.  Acute anterior STEMI: Complicated by cardiogenic shock.    The patient recovered very well off of inotropic therapy since early yesterday morning.  His LV function is well-preserved with an LVEF of 55% by echo assessment with mild hypokinesis of the anterior wall consistent with his acute anterior infarct.  Continue medical therapy with aspirin, ticagrelor, and a high intensity statin drug.  He has been started on carvedilol, will increase dose to 6.25 mg twice daily today.  2.  Acute diastolic heart failure: Secondary to #1.  Noted to have severely elevated LVEDP at cardiac catheterization.  Received IV furosemide yesterday.  Start lisinopril 10 mg daily today.  3.  Mixed hyperlipidemia: LDL cholesterol goal less than 70, labs demonstrate a total cholesterol of 144, HDL 32, LDL 88.  The patient has been started on a high intensity statin drug with atorvastatin 80 mg.  Disposition: Transfer to telemetry today.  Likely discharge home tomorrow.  Cardiac rehab phase 1 today.  For questions or updates, please contact [place] Please consult [email or web address] for contact info under        Signed, Sherren Mocha, MD  12/03/2018, 7:46 AM

## 2018-12-04 ENCOUNTER — Telehealth: Payer: Self-pay | Admitting: Physician Assistant

## 2018-12-04 ENCOUNTER — Encounter (HOSPITAL_COMMUNITY): Payer: Self-pay | Admitting: Physician Assistant

## 2018-12-04 DIAGNOSIS — R7303 Prediabetes: Secondary | ICD-10-CM | POA: Diagnosis present

## 2018-12-04 DIAGNOSIS — I77810 Thoracic aortic ectasia: Secondary | ICD-10-CM

## 2018-12-04 LAB — BASIC METABOLIC PANEL
Anion gap: 13 (ref 5–15)
BUN: 17 mg/dL (ref 6–20)
CO2: 22 mmol/L (ref 22–32)
Calcium: 8.8 mg/dL — ABNORMAL LOW (ref 8.9–10.3)
Chloride: 103 mmol/L (ref 98–111)
Creatinine, Ser: 0.96 mg/dL (ref 0.61–1.24)
GFR calc Af Amer: >60 mL/min (ref >60)
GFR calc non Af Amer: >60 mL/min (ref >60)
Glucose, Bld: 141 mg/dL — ABNORMAL HIGH (ref 70–99)
Potassium: 3.8 mmol/L (ref 3.5–5.1)
Sodium: 138 mmol/L (ref 135–145)

## 2018-12-04 MED ORDER — LISINOPRIL 10 MG PO TABS: 10.0000 mg | ORAL_TABLET | Freq: Every day | ORAL | 11 refills | Status: DC

## 2018-12-04 MED ORDER — ASPIRIN 81 MG PO CHEW: 81.0000 mg | CHEWABLE_TABLET | Freq: Every day | ORAL | 3 refills | Status: DC

## 2018-12-04 MED ORDER — TICAGRELOR 90 MG PO TABS: 90.0000 mg | ORAL_TABLET | Freq: Two times a day (BID) | ORAL | 11 refills | Status: DC

## 2018-12-04 MED ORDER — NITROGLYCERIN 0.4 MG SL SUBL: 0.4000 mg | SUBLINGUAL_TABLET | SUBLINGUAL | 3 refills | Status: DC | PRN

## 2018-12-04 MED ORDER — ROSUVASTATIN CALCIUM 20 MG PO TABS: 40.0000 mg | ORAL_TABLET | Freq: Every day | ORAL | Status: DC

## 2018-12-04 MED ORDER — CARVEDILOL 6.25 MG PO TABS: 6.2500 mg | ORAL_TABLET | Freq: Two times a day (BID) | ORAL | 11 refills | Status: DC

## 2018-12-04 MED ORDER — ROSUVASTATIN CALCIUM 40 MG PO TABS: 40.0000 mg | ORAL_TABLET | Freq: Every day | ORAL | 11 refills | Status: DC

## 2018-12-04 MED FILL — ROSUVASTATIN CALCIUM 40 MG: 40 | 30 days supply | Qty: 30 | Fill #0

## 2018-12-04 MED FILL — LISINOPRIL 10 MG TABS: 10 | 30 days supply | Qty: 30 | Fill #0

## 2018-12-04 MED FILL — BRILINTA 90 MG TABLET: 90 | 30 days supply | Qty: 60 | Fill #0

## 2018-12-04 MED FILL — CARVEDILOL 6.25 MG TABLET: 6.25 | 30 days supply | Qty: 60 | Fill #0

## 2018-12-04 MED FILL — NITROGLYCERIN 0.4 MG TAB SL: 0.4 | 30 days supply | Qty: 100 | Fill #0

## 2018-12-04 MED FILL — ASPIRIN LOW DOSE 81 MG CHEW: 81 | 90 days supply | Qty: 90 | Fill #0

## 2018-12-04 NOTE — Care Management (Signed)
12-04-18 1142 Pt has [employer]. No further needs from CM at this time. Bethena Roys, RN,BSN Case Manager [phone]

## 2018-12-04 NOTE — Progress Notes (Signed)
Transitions of Care Pharmacist Note  Zachary Flynn is a 58 y.o. male that has been diagnosed with a STEMI and will be prescribed several new medications at discharge.   Patient Education: I provided the following education on 10/2 to the patient: How to take the medication Described what the medication is Signs of bleeding  Discharge Medications Plan: The patient wants to have their discharge medications filled by the Transitions of Care pharmacy rather than their usual pharmacy.  The primary doctor for this patient has been contacted to send all discharge medication prescriptions to the Transitions of Care pharmacy, the discharge orders pharmacy has been changed to the Transitions of Care pharmacy, the patient will receive a phone call regarding co-pay, and their medications will be delivered by the Transitions of Care pharmacy.   Wife will bring method of payment when she comes to pick him up.  Insurance information: BIN: [id number] PCN: [id number] Group number: [id number] Member ID number: [id number] Person code: [id number]  Thank you,   Kennon Holter, PharmD PGY1 Ambulatory Care Pharmacy Resident [employer] Phone: [phone] December 04, 2018

## 2018-12-04 NOTE — Progress Notes (Addendum)
   Pt preliminarily seen, adm with acute MI 9/30. Doing well today. Cath site without complication. Asymptomatic. VSS. Anticipate dc today. Dr. Burt Knack to advise on when to RTW - pt is a Engineer, structural. Note patient was on rosuvastatin at home, could consider titrating this instead of switching completely to atorvastatin since we know he tolerates Crestor. Thao Vanover PA-C

## 2018-12-04 NOTE — Discharge Summary (Addendum)
Discharge Summary    Patient ID: CONSTANTINO ANNE MRN: [id number]; DOB: 14-May-1960  Admit date: 12/02/2018 Discharge date: 12/04/2018  Primary Care Provider: Lawerance Cruel, MD  Primary Cardiologist: [name], MD  Primary Electrophysiologist:  None   Discharge Diagnoses    Principal Problem:   Acute ST elevation myocardial infarction (STEMI) involving left anterior descending (LAD) coronary artery [facility]) Active Problems:   Essential hypertension   Hyperlipidemia with target LDL less than 70   Acute diastolic heart failure ([place] [facility])   Coronary artery disease involving native coronary artery of native heart with unstable angina pectoris ([place])   Presence of drug coated stent in LAD coronary artery   Cardiogenic shock (HCC) -> mild   Pre-diabetes   Mild dilation of ascending aorta (HCC)   Allergies Allergies  Allergen Reactions  . Food     Lactose intolerant, no dairy products  . Pollen Extract     Diagnostic Studies/Procedures    Cath 12/02/18 Conclusion   Prox LAD to Mid LAD lesion is 100% stenosed with 50% stenosed side branch in 1st Diag.  Post intervention, there is a 0% residual stenosis.  Post intervention, the side branch was reduced to 55% residual stenosis.  A drug-eluting stent was successfully placed using a STENT RESOLUTE ONYX 3.0X22.  Ramus lesion is 55% stenosed.  The left ventricular systolic function is normal.  LV end diastolic pressure is severely elevated.  The left ventricular ejection fraction is 55-65% by visual estimate.  There is no mitral valve regurgitation.  There is no aortic valve stenosis.   SUMMARY -> ACUTE ANTERIOR STEMI INVOLVING THE LAD  Severe single-vessel CAD with 100% proximal LAD occlusion and left dominant system --> successful DES PCI-Resolute Onyx DES 3.0 mm x 32 mm (3.5-3.1 mm)  Moderate RI stenosis, not flow-limiting.  Preserved EF post PCI with severely elevated LVEDP-ACUTE DIASTOLIC HEART FAILURE   Borderline Cardiogenic Shock -> stable on modest dose Levophed   RECOMMENDATIONS  Admit to CCU overnight.  Wean Levophed off as blood pressure tolerates  1 dose of the Lasix given in the Cath Lab, reassess in the morning.  Check 2D echo  Hold off on beta-blocker/ARB pending blood pressure stabilization  Check lipid panel, A1c and TSH  High-dose statin ordered  DAPT per recommendations noted elsewhere  Likely not fast-track discharge, but would anticipate discharge in roughly 3 days.   2D Echo 12/02/18 IMPRESSIONS  1. Left ventricular ejection fraction, by visual estimation, is 50 to 55%. The left ventricle has mildly decreased function. Normal left ventricular size. There is moderately increased left ventricular hypertrophy. Apical septal and apical anterior  akinesis.  2. Left ventricular diastolic Doppler parameters are consistent with impaired relaxation pattern of LV diastolic filling.  3. Global right ventricle has normal systolic function.The right ventricular size is normal. No increase in right ventricular wall thickness.  4. Left atrial size was normal.  5. Right atrial size was normal.  6. The mitral valve is normal in structure. No evidence of mitral valve regurgitation. No evidence of mitral stenosis.  7. The tricuspid valve is normal in structure. Tricuspid valve regurgitation is trivial.  8. The aortic valve is tricuspid Aortic valve regurgitation was not visualized by color flow Doppler. Structurally normal aortic valve, with no evidence of sclerosis or stenosis.  9. There is mild dilatation of the ascending aorta measuring 39 mm. 10. The inferior vena cava is normal in size with greater than 50% respiratory variability, suggesting right atrial pressure of 3 mmHg.  11. TR signal is inadequate for assessing pulmonary artery systolic pressure.   _____________   History of Present Illness     Mr. Eckenrode is a 58 y/o M with HTN, HLD, family history of CAD but no  personal cardiac history who presented to [facility] with worsening chest discomfort. He has had intermittent chest discomfort mostly exertional while doing yard work over the last 2 weeks  He has not had any resting symptoms until tonight when he was try to lay down in the evening of 12/01/2018. Due to worsening pain behind his shoulder blades to the front of his chest with numbness and tingling in his hands, he called EMS. EKG by EMS showed anterior ST elevations also seen here in the ER.  Code STEMI was called in route and he was taken directly to the cath lab.   Hospital Course     1. Acute anterior STEMI/CAD - cath results as above, with severe single-vessel CAD with 100% proximal LAD occlusion s/p DES. He had moderate RI disease, nonobstructive. He had severely elevated LVEDP c/w acute diastolic CHF related to MI. He also had borderline cardiogenic shock requiring levophed. He was able to be weaned off inotropic therapy and medications were adjusted. Peak troponin >27,000. In lieu of home amlodipine/ACEI combo, he was started on carvedilol and lisinopril. He was also started on DAPT. Statin was titrated (was on Crestor 20mg  at home). 2D echo showed EF 50-55%, moderately increased left ventricular hypertrophy, apical septal and apical anterior  akinesis, impaired LV relaxation, mild dilation of ascending aorta. He was given instructions not to take SL NTG within 48 hours of Cialis and vice versa.  2. Acute diastolic heart failure - Secondary to #1. Noted to have severely elevated LVEDP at cardiac catheterization.  He received a dose of IV furosemide on 9/30 but otherwise did not require standing diuretics. He was started on lisinopril.  3. Mixed hyperlipidemia: goal LDL cholesterol goal less than 70. Labs demonstrated a total cholesterol of 144, HDL 32, LDL 88. Statin titrated as outlined above. Reviewed lamisil with pharmD, no specific contraindication with statin. If the patient is tolerating statin at  time of follow-up appointment, would consider rechecking liver function/lipid panel in 6-8 weeks.  4. Pre-DM - A1C 5.8. Will need continued OP PCP monitoring for this.  5. Mildly dilated ascending aorta - noted on echo 30mm, will need usual follow-up (ie 1 yr).  The patient has been seen by cardiac rehab and did well. TOC f/u has been arranged. 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 His appointment is 12/14/18. Dr. Burt Knack feels that if he is doing well at that visit, he can be released to return to work and driving. Meds were sent to [place]. Dr. Burt Knack has seen and examined the patient today and feels he is stable for discharge.  _____________  Vital Signs. BP 130/74 (BP Location: Left Arm)   Pulse 74   Temp 98.3 F (36.8 C) (Oral)   Resp 14   Ht 5\' 7"  (1.702 m)   Wt 79.4 kg   SpO2 99%   BMI 27.42 kg/m  General: Well developed, well nourished AAM, in no acute distress Head: Normocephalic, atraumatic, sclera non-icteric, no xanthomas, nares are without discharge. Neck: Negative for carotid bruits. JVP not elevated. Lungs: Clear bilaterally to auscultation without wheezes, rales, or rhonchi. Breathing is unlabored. Heart: RRR S1 S2 without murmurs, rubs, or gallops.  Abdomen: Soft,  non-tender, non-distended with normoactive bowel sounds. No rebound/guarding. Extremities: No clubbing or cyanosis. No edema. Distal pedal pulses are 2+ and equal bilaterally. Right radial cath site without hematoma or ecchymosis; good pulse. Neuro: Alert and oriented X 3. Moves all extremities spontaneously. Psych:  Responds to questions appropriately with a normal affect.  Labs & Radiologic Studies    CBC Recent Labs    12/02/18 0126 12/02/18 0203 12/02/18 0436  WBC 7.1  --  13.6*  NEUTROABS 3.9  --   --   HGB 11.9* 13.3 14.7  HCT 38.6* 39.0 46.3  MCV 94.1  --  90.8  PLT 178  --  [id number]   Basic Metabolic Panel Recent Labs    12/03/18  0505 12/04/18 0350  NA 139 138  K 3.8 3.8  CL 106 103  CO2 27 22  GLUCOSE 157* 141*  BUN 12 17  CREATININE 1.00 0.96  CALCIUM 9.0 8.8*   Liver Function Tests Recent Labs    12/02/18 0126  AST 34  ALT 30  ALKPHOS 77  BILITOT 1.2  PROT 6.8  ALBUMIN 4.4    High Sensitivity Troponin:   Recent Labs  Lab 12/02/18 0126 12/02/18 0436 12/02/18 0633 12/02/18 1003  TROPONINIHS 111* >27,000* >27,000* >27,000*    Hemoglobin A1C Recent Labs    12/02/18 0436  HGBA1C 5.8*   Fasting Lipid Panel Recent Labs    12/02/18 0126  CHOL 144  HDL 32*  LDLCALC 88  TRIG 120  CHOLHDL 4.5   Thyroid Function Tests Recent Labs    12/02/18 0436  TSH 1.404   _____________  No results found. Disposition   Pt is being discharged home today in good condition.  Follow-up Plans & Appointments    Follow-up Information    Lendon Colonel, NP Follow up.   Specialties: Nurse Practitioner, Radiology, Cardiology Why: [place] location - see appointment information below on 12/14/18. Curt Bears is one of the nurse practitioners that works closely with our cardiology team and Dr. Ellyn Hack. Contact information: [address] [place] [address] [phone]        Lawerance Cruel, MD Follow up.   Specialty: Family Medicine Why: Follow up with your primary care for continued monitoring of your blood sugar and general health maintenance. Your labwork showed pre-diabetes. Contact information: [place] [address] [phone]          Discharge Instructions    Amb Referral to Cardiac Rehabilitation   Complete by: As directed    Diagnosis:  STEMI Coronary Stents     After initial evaluation and assessments completed: Virtual Based Care may be provided alone or in conjunction with Phase 2 Cardiac Rehab based on patient barriers.: Yes   Diet - low sodium heart healthy   Complete by: As directed    Discharge  instructions   Complete by: As directed    You have a medicine on your list called sildenafil that can potentially interact with nitroglycerin. Do not take nitroglycerin if you have taken sildenafil in the last 48 hours. The opposite is true as well. Do not take sildenafil if you have taken nitroglycerin in the last 48 hours. If you take these two medicines, they can cause low blood pressure if taken together.   Increase activity slowly   Complete by: As directed    No lifting over 10 lbs for 4 weeks. No sexual activity for 4 weeks. You may not return to work or  driving until cleared by your cardiology team (work note provided). If you are doing well at your appointment on 12/14/18, Dr. Burt Knack feels you may be released to work and driving at that time. If your workplace requires FMLA needs, please call our office as we have a department that processes this paperwork. Keep procedure site clean & dry. If you notice increased pain, swelling, bleeding or pus, call/return!  You may shower, but no soaking baths/hot tubs/pools for 1 week.      Discharge Medications   Allergies as of 12/04/2018      Reactions   Food    Lactose intolerant, no dairy products   Pollen Extract       Medication List    STOP taking these medications   amLODipine-benazepril 10-20 MG capsule Commonly known as: LOTREL     TAKE these medications   aspirin 81 MG chewable tablet Chew 1 tablet (81 mg total) by mouth daily.   carvedilol 6.25 MG tablet Commonly known as: COREG Take 1 tablet (6.25 mg total) by mouth 2 (two) times daily with a meal.   lisinopril 10 MG tablet Commonly known as: ZESTRIL Take 1 tablet (10 mg total) by mouth daily.   nitroGLYCERIN 0.4 MG SL tablet Commonly known as: Nitrostat Place 1 tablet (0.4 mg total) under the tongue every 5 (five) minutes as needed for chest pain.   rosuvastatin 40 MG tablet Commonly known as: CRESTOR Take 1 tablet (40 mg total) by mouth daily. What changed:    medication strength  how much to take   sildenafil 20 MG tablet Commonly known as: REVATIO Take 20 mg by mouth daily as needed for erectile dysfunction.   terbinafine 250 MG tablet Commonly known as: LAMISIL Take 250 mg by mouth daily. Take daily for one week each month   ticagrelor 90 MG Tabs tablet Commonly known as: BRILINTA Take 1 tablet (90 mg total) by mouth 2 (two) times daily.       Acute coronary syndrome (MI, NSTEMI, STEMI, etc) this admission?: Yes.     AHA/ACC Clinical Performance & Quality Measures: 1. Aspirin prescribed? - Yes 2. ADP Receptor Inhibitor (Plavix/Clopidogrel, Brilinta/Ticagrelor or Effient/Prasugrel) prescribed (includes medically managed patients)? - Yes 3. Beta Blocker prescribed? - Yes 4. High Intensity Statin (Lipitor 40-80mg  or Crestor 20-40mg ) prescribed? - Yes 5. EF assessed during THIS hospitalization? - Yes 6. For EF <40%, was ACEI/ARB prescribed? - Not Applicable (EF >/= [id number]) 7. For EF <40%, Aldosterone Antagonist (Spironolactone or Eplerenone) prescribed? - Not Applicable (EF >/= [id number]) 8. Cardiac Rehab Phase II ordered (Included Medically managed Patients)? - Yes   Outstanding Labs/Studies   If the patient is tolerating statin at time of follow-up appointment, would consider rechecking liver function/lipid panel in 6-8 weeks.  Duration of Discharge Encounter   Greater than 30 minutes including physician time.  Signed, Charlie Pitter, PA-C 12/04/2018, 11:28 AM   Patient seen, examined. Available data reviewed. Agree with findings, assessment, and plan as outlined by Melina Copa, PA-C. On my exam: Vitals:   12/04/18 0420 12/04/18 1046  BP: 130/74 130/85  Pulse: 74 83  Resp: 14   Temp: 98.3 F (36.8 C)   SpO2: 99%    Pt is alert and oriented, NAD HEENT: normal Neck: JVP - normal, carotids 2+=  Lungs: CTA bilaterally CV: RRR without murmur or gallop Abd: soft, NT, Positive BS, no hepatomegaly Ext: no C/C/E, distal pulses intact  and equal.  Right radial site clear. Skin: warm/dry no rash  The patient is stable with no recurrent angina.  He is ready for discharge today.  We discussed his return to work after he is seen in follow-up 1 week from Monday.  His medical program is reviewed and includes a high intensity statin drug, dual antiplatelet therapy with aspirin and ticagrelor, a beta-blocker, and an ACE inhibitor.  He will discontinue his combination pill amlodipine/benazepril.  All of his questions are answered.  Follow-up is arranged.     Sherren Mocha, M.D. 12/04/2018 11:38 AM

## 2018-12-04 NOTE — Telephone Encounter (Signed)
Pt likely being DC'd today - will need TOC call. I plan to schedule him with Arnold Long on 10/12. Pt of Dr. Ellyn Hack. Thanks!

## 2018-12-04 NOTE — Progress Notes (Signed)
CARDIAC REHAB PHASE I   PRE:  Rate/Rhythm: 87 SR  BP:  Supine:   Sitting: 133/91  Standing:    SaO2:   MODE:  Ambulation: 1000 ft   POST:  Rate/Rhythm: 109 ST  BP:  Supine:   Sitting: 140/86  Standing:    SaO2: 98%RA [id number] Pt walked 1000 ft with steady gait and no CP. Tolerated well. No questions re ed done yesterday.   Graylon Good, RN BSN  12/04/2018 8:34 AM

## 2018-12-07 ENCOUNTER — Telehealth (HOSPITAL_COMMUNITY): Payer: Self-pay

## 2018-12-07 MED ORDER — VALSARTAN 80 MG PO TABS: 80.0000 mg | ORAL_TABLET | Freq: Every day | ORAL | 11 refills | Status: DC

## 2018-12-07 NOTE — Telephone Encounter (Signed)
Pt insurance is active and benefits verified through [facility]. Co-pay $25.00, DED $500.00/$0.00 met, out of pocket $2,500.00/$583.13 met, co-insurance 0%. No pre-authorization required. Passport, 12/07/2018 @ 12:23PM, REF# [id number]  Will contact patient to see if he is interested in the Cardiac Rehab Program. If interested, patient will need to complete follow up appt. Once completed, patient will be contacted for scheduling upon review by the RN Navigator.

## 2018-12-07 NOTE — Telephone Encounter (Signed)
Called patient to see if he is interested in the Cardiac Rehab Program. Patient expressed interest. Explained scheduling process and went over insurance, patient verbalized understanding. Will contact patient for scheduling once f/u has been completed.  °

## 2018-12-07 NOTE — Telephone Encounter (Signed)
D/C lisinopril  Start valsartan 80mg  daily.

## 2018-12-07 NOTE — Telephone Encounter (Signed)
Patient contacted regarding discharge from [facility] on 12/04/2018.  Patient understands to follow up with provider K. Lawrence DNP on 12/14/18 at 9:45am at [facility]. Patient understands discharge instructions? YES Patient understands medications and regiment? YES Patient understands to bring all medications to this visit? YES  Patient reports COUGH since starting new medications - lisinopril is new for him. The cough is limiting his ability to carry on conversations.   Will route to [facility] for med review and med change suggestion

## 2018-12-07 NOTE — Telephone Encounter (Signed)
Patient called with [facility] recommendations. He voiced understanding of med change. Rx(s) sent to pharmacy electronically. Lisinopril added to allergy list

## 2018-12-13 NOTE — Progress Notes (Signed)
Cardiology Office Note   Date:  12/14/2018   ID:  Zachary Flynn, DOB Feb 04, 1961, MRN [id number]  PCP:  Lawerance Cruel, MD  Cardiologist:  Dr. Ellyn Hack  CC: Hospital Follow Up    History of Present Illness: Zachary Flynn is a 59 y.o. male who presents for transitional care follow-up appointment after admission for acute ST elevated MI involving Zachary left anterior descending coronary artery, hypertension, hyperlipidemia, and prediabetes.  Zachary Flynn was admitted on 12/02/2018 after experiencing chest discomfort while at rest, with pain behind his shoulder blades to Zachary front of his chest with numbness and tingling in his hands.  Zachary Flynn had been having some exertional discomfort while doing yard work over Zachary last 2 weeks.  On arrival to Zachary ED Zachary Flynn's EKG revealed anterior ST elevations, and this was also found by EMS during initial evaluation in his home.  Code STEMI was called.  Zachary Flynn was noted to have a 100% LAD occlusion requiring drug-eluting stent placement.  Zachary Flynn had moderate RI disease which was nonobstructed.  Zachary Flynn was also noted to have severely elevated LVEDP consistent with acute diastolic heart failure related to MI.  Flynn did have borderline cardiogenic shock and required levophed.  Rosuvastatin was increased to 40 mg daily with a goal of LDL less than 70.  Zachary Flynn was diuresed during hospitalization but was not discharged on diuretics.  It was noted incidentally that Zachary Flynn had a mildly dilated ascending aorta at 39 mm.  Follow-up echo in 1 year.  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  Zachary Flynn is doing well Zachary Flynn would be allowed to return to work after being seen today.  On discharge Zachary Flynn was started on dual antiplatelet therapy, carvedilol, and lisinopril.  Zachary Flynn is doing well, is without cardiac complaints. Zachary Flynn is anxious to return to work.  Zachary Flynn denies bleeding with antiplatelet therapy. Zachary Flynn is medically compliant.   Past Medical History:  Diagnosis Date  . Acute diastolic CHF (congestive heart failure) ([place])    a. LVEDP elevated at time of MI.  Marland Kitchen CAD (coronary artery disease) 12/02/2018   a. 100% proximal LAD insetting of anterior STEMI--PCI: Resolute DES 3.0 mm x 22 mm (postdilated 3.5-3.1 mm).  Also 50% proximal RI.  Left dominant system.  Normal EF by LV gram.  . Cardiogenic shock ([place]) 12/2018  . Essential hypertension 12/02/2018  . Hyperlipidemia with target LDL less than 70 12/02/2018  . Mild dilation of ascending aorta ([place])    a. by echo 12/2018.  . Pre-diabetes     Past Surgical History:  Procedure Laterality Date  . CERVICAL SPINE SURGERY    . CORONARY/GRAFT ACUTE MI REVASCULARIZATION N/A 12/02/2018   Procedure: Coronary/Graft Acute MI Revascularization;  Surgeon: [name], MD;  Location: [facility];  Service: Cardiovascular;  Laterality: N/A;  . LEFT HEART CATH AND CORONARY ANGIOGRAPHY N/A 12/02/2018   Procedure: LEFT HEART CATH AND CORONARY ANGIOGRAPHY;  Surgeon: [name], MD;  Location: [facility];  Service: Cardiovascular;  Laterality: N/A;  . REVISION TOTAL HIP ARTHROPLASTY Left 2003  . TOTAL HIP ARTHROPLASTY Left 2000     Current Outpatient Medications  Medication Sig Dispense Refill  . aspirin 81 MG chewable tablet Chew 1 tablet (81 mg total) by mouth daily. 90 tablet 3  . carvedilol (COREG) 6.25 MG tablet Take 1 tablet (6.25 mg total) by mouth 2 (two) times daily with a  meal. 180 tablet 3  . nitroGLYCERIN (NITROSTAT) 0.4 MG SL tablet Place 1 tablet (0.4 mg total) under Zachary tongue every 5 (five) minutes as needed for chest pain. 100 tablet 3  . rosuvastatin (CRESTOR) 40 MG tablet Take 1 tablet (40 mg total) by mouth daily. 90 tablet 3  . sildenafil (REVATIO) 20 MG tablet Take 20 mg by mouth daily as needed for erectile dysfunction.    . terbinafine (LAMISIL) 250 MG tablet Take 250 mg by mouth daily. Take daily for one week each month    . ticagrelor  (BRILINTA) 90 MG TABS tablet Take 1 tablet (90 mg total) by mouth 2 (two) times daily. 180 tablet 3  . valsartan (DIOVAN) 80 MG tablet Take 1 tablet (80 mg total) by mouth daily. 90 tablet 3   No current facility-administered medications for this visit.     Allergies:   Food, Lisinopril, and Pollen extract    Social History:  Zachary Flynn  reports that Zachary Flynn has never smoked. Zachary Flynn has never used smokeless tobacco.   Family History:  Zachary Flynn's family history includes CAD in his mother and sister; Heart attack in his mother and sister.    ROS: All other systems are reviewed and negative. Unless otherwise mentioned in H&P    PHYSICAL EXAM: VS:  BP 122/72   Pulse 61   Ht 5\' 7"  (1.702 m)   Wt 178 lb (80.7 kg)   BMI 27.88 kg/m  , BMI Body mass index is 27.88 kg/m. GEN: Well nourished, well developed, in no acute distress HEENT: normal Neck: no JVD, carotid bruits, or masses Cardiac: RRR; no murmurs, rubs, or gallops,no edema  Respiratory:  Clear to auscultation bilaterally, normal work of breathing GI: soft, nontender, nondistended, + BS MS: no deformity or atrophy Skin: warm and dry, no rash Neuro:  Strength and sensation are intact Psych: euthymic mood, full affect   EKG:  NSR rate of 61 bpm LAFB, Anterolateral ischemia.   Recent Labs: 12/02/2018: ALT 30; Hemoglobin 14.7; Platelets 235; TSH 1.404 12/04/2018: BUN 17; Creatinine, Ser 0.96; Potassium 3.8; Sodium 138    Lipid Panel    Component Value Date/Time   CHOL 144 12/02/2018 0126   TRIG 120 12/02/2018 0126   HDL 32 (L) 12/02/2018 0126   CHOLHDL 4.5 12/02/2018 0126   VLDL 24 12/02/2018 0126   LDLCALC 88 12/02/2018 0126      Wt Readings from Last 3 Encounters:  12/14/18 178 lb (80.7 kg)  12/04/18 175 lb 0.7 oz (79.4 kg)      Other studies Reviewed: Cath 12/02/18 Conclusion   Prox LAD to Mid LAD lesion is 100% stenosed with 50% stenosed side branch in 1st Diag.  Post intervention, there is a 0% residual  stenosis.  Post intervention, Zachary side branch was reduced to 55% residual stenosis.  A drug-eluting stent was successfully placed using a STENT RESOLUTE ONYX 3.0X22.  Ramus lesion is 55% stenosed.  Zachary left ventricular systolic function is normal.  LV end diastolic pressure is severely elevated.  Zachary left ventricular ejection fraction is 55-65% by visual estimate.  There is no mitral valve regurgitation.  There is no aortic valve stenosis.  SUMMARY -> ACUTE ANTERIOR STEMI INVOLVING Zachary LAD  Severe single-vessel CAD with 100% proximal LAD occlusion and left dominant system --> successful DES PCI-Resolute Onyx DES 3.0 mm x 32 mm (3.5-3.1 mm)  Moderate RI stenosis, not flow-limiting.  Preserved EF post PCI with severely elevated LVEDP-ACUTE DIASTOLIC HEART FAILURE  Borderline  Cardiogenic Shock -> stable on modest dose Levophed   RECOMMENDATIONS  Admit to CCU overnight. Wean Levophed off as blood pressure tolerates  1 dose of Zachary Lasix given in Zachary Cath Lab, reassess in Zachary morning.  Check 2D echo  Hold off on beta-blocker/ARB pending blood pressure stabilization  Check lipid panel, A1c and TSH  High-dose statin ordered  DAPT per recommendations noted elsewhere  Likely not fast-track discharge, but would anticipate discharge in roughly 3 days.   2D Echo 12/02/18 IMPRESSIONS 1. Left ventricular ejection fraction, by visual estimation, is 50 to 55%. Zachary left ventricle has mildly decreased function. Normal left ventricular size. There is moderately increased left ventricular hypertrophy. Apical septal and apical anterior  akinesis. 2. Left ventricular diastolic Doppler parameters are consistent with impaired relaxation pattern of LV diastolic filling. 3. Global right ventricle has normal systolic function.Zachary right ventricular size is normal. No increase in right ventricular wall thickness. 4. Left atrial size was normal. 5. Right atrial size was normal. 6.  Zachary mitral valve is normal in structure. No evidence of mitral valve regurgitation. No evidence of mitral stenosis. 7. Zachary tricuspid valve is normal in structure. Tricuspid valve regurgitation is trivial. 8. Zachary aortic valve is tricuspid Aortic valve regurgitation was not visualized by color flow Doppler. Structurally normal aortic valve, with no evidence of sclerosis or stenosis. 9. There is mild dilatation of Zachary ascending aorta measuring 39 mm. 10. Zachary inferior vena cava is normal in size with greater than 50% respiratory variability, suggesting right atrial pressure of 3 mmHg. 11. TR signal is inadequate for assessing pulmonary artery systolic pressure.   _____________    ASSESSMENT AND PLAN:  1. CAD: Recent ST elevation MI of Zachary left anterior descending artery with 100% occluded vessel.  Requiring drug-eluting stent.  Zachary Flynn is now on dual antiplatelet therapy and tolerating medications without side effects.  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  I have given him a letter allowing him return to work (Zachary Flynn works at [employer]), have also filled out [employer] which Zachary Flynn will pick up tomorrow once they are completed.  Zachary Flynn will follow-up in 3 months with Dr. Ellyn Hack for ongoing assessment and management.  At that time Zachary Flynn will need to have fasting lipids LFTs, and a BMET.   2.  Hypercholesterolemia: Zachary Flynn is now on atorvastatin 40 mg daily.  Goal of LDL less than 70 in Flynn with known CAD.  During hospitalization LDL was calculated at 88.  3.  Hypertension: Blood pressures currently well controlled on valsartan 80 mg daily.  Zachary Flynn had been on benazepril however this caused a cough.  Zachary Flynn continues to have a little bit of a cough today but Zachary Flynn states is getting better.  I have explained to him if this is persistent to let us know.  We may need to change medications.  For now no change in his regimen.   Current medicines are reviewed at length with Zachary Flynn today.    Labs/ tests  ordered today include: None  Phill Myron. West Pugh, ANP, AACC   12/14/2018 1:05 PM    [facility] [place] [address] Office [phone] Fax [phone]  Notice: This dictation was prepared with Dragon dictation along with smaller phrase technology. Any transcriptional errors that result from this process are unintentional and may not be corrected upon review.

## 2018-12-14 ENCOUNTER — Other Ambulatory Visit: Payer: Self-pay

## 2018-12-14 ENCOUNTER — Ambulatory Visit: Payer: 59 | Admitting: Adult Health

## 2018-12-14 ENCOUNTER — Encounter: Payer: Self-pay | Admitting: Adult Health

## 2018-12-14 VITALS — BP 122/72 | HR 61 | Ht 67.0 in | Wt 178.0 lb

## 2018-12-14 DIAGNOSIS — E785 Hyperlipidemia, unspecified: Secondary | ICD-10-CM

## 2018-12-14 DIAGNOSIS — I10 Essential (primary) hypertension: Secondary | ICD-10-CM

## 2018-12-14 DIAGNOSIS — I2102 ST elevation (STEMI) myocardial infarction involving left anterior descending coronary artery: Secondary | ICD-10-CM

## 2018-12-14 DIAGNOSIS — I2510 Atherosclerotic heart disease of native coronary artery without angina pectoris: Secondary | ICD-10-CM | POA: Diagnosis not present

## 2018-12-14 MED ORDER — CARVEDILOL 6.25 MG PO TABS: 6.2500 mg | ORAL_TABLET | Freq: Two times a day (BID) | ORAL | 3 refills | Status: DC

## 2018-12-14 MED ORDER — ROSUVASTATIN CALCIUM 40 MG PO TABS: 40.0000 mg | ORAL_TABLET | Freq: Every day | ORAL | 3 refills | Status: DC

## 2018-12-14 MED ORDER — VALSARTAN 80 MG PO TABS: 80.0000 mg | ORAL_TABLET | Freq: Every day | ORAL | 3 refills | Status: DC

## 2018-12-14 MED ORDER — TICAGRELOR 90 MG PO TABS: 90.0000 mg | ORAL_TABLET | Freq: Two times a day (BID) | ORAL | 3 refills | Status: DC

## 2018-12-14 NOTE — Patient Instructions (Signed)
Medication Instructions:  Continue current medications  If you need a refill on your cardiac medications before your next appointment, please call your pharmacy.  Labwork: CBC, BMP, Fasting Lipid and Liver in 2 Months HERE IN OUR OFFICE AT [facility]   You will need to fast. DO NOT EAT OR DRINK PAST MIDNIGHT.      Take the provided lab slips with you to the lab for your blood draw.   When you have your labs (blood work) drawn today and your tests are completely normal, you will receive your results only by MyChart Message (if you have MyChart) -OR-  A paper copy in the mail.  If you have any lab test that is abnormal or we need to change your treatment, we will call you to review these results.  Testing/Procedures: None Ordered  Follow-Up: You will need a follow up appointment in 3 months.  Please call our office 2 months in advance to schedule this appointment.  You may see [name], MD or one of the following Advanced Practice Providers on your designated Care Team:   Rosaria Ferries, PA-C . Jory Sims, DNP, ANP     At [facility], you and your health needs are our priority.  As part of our continuing mission to provide you with exceptional heart care, we have created designated Provider Care Teams.  These Care Teams include your primary Cardiologist (physician) and Advanced Practice Providers (APPs -  Physician Assistants and Nurse Practitioners) who all work together to provide you with the care you need, when you need it.  Thank you for choosing [facility] at [facility]!!

## 2018-12-15 ENCOUNTER — Ambulatory Visit: Payer: 59 | Admitting: Adult Health

## 2018-12-16 ENCOUNTER — Telehealth (HOSPITAL_COMMUNITY): Payer: Self-pay

## 2018-12-16 NOTE — Telephone Encounter (Signed)
Called and spoke with pt in regards to CR, pt stated he was not sure because of the co-payment. I did adv/offer pt our VCR program, pt stated not at this time.  Closed referral

## 2019-02-11 LAB — BASIC METABOLIC PANEL
BUN/Creatinine Ratio: 14 (ref 10–24)
BUN: 12 mg/dL (ref 8–27)
CO2: 24 mmol/L (ref 20–29)
Calcium: 9.9 mg/dL (ref 8.6–10.2)
Chloride: 103 mmol/L (ref 96–106)
Creatinine, Ser: 0.88 mg/dL (ref 0.76–1.27)
GFR calc Af Amer: 108 mL/min/{1.73_m2} (ref >59)
GFR calc non Af Amer: 93 mL/min/{1.73_m2} (ref >59)
Glucose: 137 mg/dL — ABNORMAL HIGH (ref 65–99)
Potassium: 5.3 mmol/L — ABNORMAL HIGH (ref 3.5–5.2)
Sodium: 142 mmol/L (ref 134–144)

## 2019-02-11 LAB — LIPID PANEL
Chol/HDL Ratio: 3 ratio (ref 0.0–5.0)
Cholesterol, Total: 99 mg/dL — ABNORMAL LOW (ref 100–199)
HDL: 33 mg/dL — ABNORMAL LOW (ref >39)
LDL Chol Calc (NIH): 48 mg/dL (ref 0–99)
Triglycerides: 94 mg/dL (ref 0–149)
VLDL Cholesterol Cal: 18 mg/dL (ref 5–40)

## 2019-02-11 LAB — CBC
Hematocrit: 42 % (ref 37.5–51.0)
Hemoglobin: 13.5 g/dL (ref 13.0–17.7)
MCH: 28 pg (ref 26.6–33.0)
MCHC: 32.1 g/dL (ref 31.5–35.7)
MCV: 87 fL (ref 79–97)
Platelets: 175 10*3/uL (ref 150–450)
RBC: 4.83 x10E6/uL (ref 4.14–5.80)
RDW: 11.8 % (ref 11.6–15.4)
WBC: 5.1 10*3/uL (ref 3.4–10.8)

## 2019-02-11 LAB — HEPATIC FUNCTION PANEL
ALT: 44 IU/L (ref 0–44)
AST: 27 IU/L (ref 0–40)
Albumin: 4.8 g/dL (ref 3.8–4.9)
Alkaline Phosphatase: 84 IU/L (ref 39–117)
Bilirubin Total: 0.8 mg/dL (ref 0.0–1.2)
Bilirubin, Direct: 0.22 mg/dL (ref 0.00–0.40)
Total Protein: 6.9 g/dL (ref 6.0–8.5)

## 2019-03-16 ENCOUNTER — Ambulatory Visit: Payer: 59 | Admitting: Cardiology

## 2019-03-17 ENCOUNTER — Ambulatory Visit (INDEPENDENT_AMBULATORY_CARE_PROVIDER_SITE_OTHER): Payer: 59 | Admitting: Cardiology

## 2019-03-17 ENCOUNTER — Other Ambulatory Visit: Payer: Self-pay

## 2019-03-17 ENCOUNTER — Encounter: Payer: Self-pay | Admitting: Cardiology

## 2019-03-17 ENCOUNTER — Encounter (INDEPENDENT_AMBULATORY_CARE_PROVIDER_SITE_OTHER): Payer: Self-pay

## 2019-03-17 VITALS — BP 137/88 | HR 62 | Temp 97.1°F | Ht 67.0 in | Wt 187.9 lb

## 2019-03-17 DIAGNOSIS — Z955 Presence of coronary angioplasty implant and graft: Secondary | ICD-10-CM

## 2019-03-17 DIAGNOSIS — I5031 Acute diastolic (congestive) heart failure: Secondary | ICD-10-CM

## 2019-03-17 DIAGNOSIS — I10 Essential (primary) hypertension: Secondary | ICD-10-CM | POA: Diagnosis not present

## 2019-03-17 DIAGNOSIS — I2102 ST elevation (STEMI) myocardial infarction involving left anterior descending coronary artery: Secondary | ICD-10-CM

## 2019-03-17 DIAGNOSIS — I25110 Atherosclerotic heart disease of native coronary artery with unstable angina pectoris: Secondary | ICD-10-CM | POA: Diagnosis not present

## 2019-03-17 DIAGNOSIS — E785 Hyperlipidemia, unspecified: Secondary | ICD-10-CM

## 2019-03-17 DIAGNOSIS — I77810 Thoracic aortic ectasia: Secondary | ICD-10-CM

## 2019-03-17 NOTE — Patient Instructions (Addendum)
Medication Instructions:   increase Rosuvastatin back to 40 mg daily  No other changes *If you need a refill on your cardiac medications before your next appointment, please call your pharmacy*  Lab Work:  be prepared to have labs ( cholesterol , CMP)  drawn at next appointment with Dr Ellyn Hack If you have labs (blood work) drawn today and your tests are completely normal, you will receive your results only by: Marland Kitchen MyChart Message (if you have MyChart) OR . A paper copy in the mail If you have any lab test that is abnormal or we need to change your treatment, we will call you to review the results.  Testing/Procedures: Not needed  Follow-Up: At [facility], you and your health needs are our priority.  As part of our continuing mission to provide you with exceptional heart care, we have created designated Provider Care Teams.  These Care Teams include your primary Cardiologist (physician) and Advanced Practice Providers (APPs -  Physician Assistants and Nurse Practitioners) who all work together to provide you with the care you need, when you need it.  Your next appointment:   4 to 5  month(s)  The format for your next appointment:   In Person  Provider:   [name], MD  Other Instructions Please monitor Blood pressure, if consistently stay above 140/90 contact office.

## 2019-03-17 NOTE — Assessment & Plan Note (Addendum)
Most recent labs from December showed excellent control of LDL and total cholesterol.  LDL is less than 50. Would like to continue current dose of rosuvastatin.  If he maintains well-controlled lipids in the future, may consider backing down to 20 mg.  We will check repeat labs prior to follow-up in 4 to 5 months.

## 2019-03-17 NOTE — Assessment & Plan Note (Signed)
Relatively acute presentation with anterior MI found to have LAD occlusion treated with a stent.  Mildly reduced EF with apical akinesis on follow-up echo, but essentially preserved EF with no further heart failure or angina symptoms.  Relatively favorable recovery.

## 2019-03-17 NOTE — Progress Notes (Signed)
Primary Care Provider: Lawerance Cruel, MD Cardiologist: [name], MD Electrophysiologist:   Clinic Note: Chief Complaint  Patient presents with  . Follow-up  . Coronary Artery Disease    h/o Ant STEMI - PCI to LAD    HPI:    Zachary Flynn is a 59 y.o. male with a PMH notable for acute acute anterior STEMI in September 2020 with PCI to the LAD along with hypertension, hyperlipidemia and prediabetes who presents today for 11-month follow-up.  Recent Hospitalizations:   12/02/2018: Admitted with chest pain at rest radiating behind his shoulder blades.  Found to have anterior STEMI--on her percent occluded LAD with DES PCI.  Noted to have acute diastolic heart failure with severely elevated LVEDP.  Also complicated by borderline cardiogenic shock supported with low-dose Levophed. ->  Successfully weaned off pressors and diuresis.    Was converted from amlodipine-ACE inhibitor combo to carvedilol and lisinopril (lisinopril converted to valsartan 80 mg because of cough).  Statin dose increased-rosuvastatin 40 mg.  Plan follow-up echo in 1 year to reevaluate ascending aorta.   Zachary Flynn was last seen on December 14, 2018 by Jory Sims, NP for initial follow-up.  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 -->  No change   Reviewed  CV studies:    The following studies were reviewed today: (if available, images/films reviewed: From Epic Chart or Care Everywhere) . Cardiac Cath-PCI 12/02/2018:p-mLAD 100% w/ 50% D1 ostial dz ( PCI - Resolute Onyx DES 3.0 mm x 32 mm --postdilated 3.5-3.1 mm).  Moderate RI 55 %.  EF 55 to 60% (preliminary LV gram showed anterior hypokinesis, resolved following PCI).  Severely elevated LVEDP of 29 mmHg (acute diastolic heart failure).    . TTE 12/02/2018: EF 50-55%.  Apical septal and apical anterior akinesis.  Moderate LVH.  GR 1 DD.  Normal LA and RA size.  No valvular disease.  Mildly dilated ascending aorta  of 39 mm.   Interval History:   Zachary Flynn returns today doing well.  No adverse symptoms.  He is up to walking 10,000 steps a day which adds up to being about 4 to 5 miles a day this is at work.  Currently there are no students on campus and therefore he is very easily socially distant.  He also probably has more time to do walking and exercise and he would usually.  He has not had any heart failure or angina symptoms since discharge.  His blood pressures have usually been in the 120 -130/70s mmHg range, and is not sure why his pressures are higher today.  Blood pressure was checked with his short sleeve on, on my recheck was little lower without the shirt.  He denies any myalgias or arthralgias from statin.  When he does notice he occasionally he will get dizzy if he stands up too quickly most notably when he gets out of bed.  But has not any syncope or near syncope.  CV Review of Symptoms (Summary) Cardiovascular ROS: no chest pain or dyspnea on exertion negative for - edema, irregular heartbeat, orthopnea, palpitations, paroxysmal nocturnal dyspnea, rapid heart rate, shortness of breath or syncope / near syncope; TIA/amaurosis fugax, claudication  The patient does not have symptoms concerning for COVID-19 infection (fever, chills, cough, or new shortness of breath).  The patient is practicing social distancing & Masking.     REVIEWED OF SYSTEMS   A comprehensive ROS was performed. Review of  Systems  Constitutional: Negative for malaise/fatigue and weight loss.  HENT: Negative for nosebleeds.   Cardiovascular: Negative for claudication.  Gastrointestinal: Negative for blood in stool, constipation, heartburn and melena.  Genitourinary: Negative for dysuria, flank pain and hematuria.  Musculoskeletal: Negative for falls and joint pain.  Neurological: Negative for dizziness, focal weakness, weakness and headaches.  Endo/Heme/Allergies: Bruises/bleeds easily (Bleeds a bit more that he  did when cuts himself shaving,).  Psychiatric/Behavioral: Negative for memory loss. The patient is not nervous/anxious and does not have insomnia.   All other systems reviewed and are negative.  I have reviewed and (if needed) personally updated the patient's problem list, medications, allergies, past medical and surgical history, social and family history.   PAST MEDICAL HISTORY   Past Medical History:  Diagnosis Date  . Acute diastolic CHF (congestive heart failure) ([place])    a. LVEDP elevated at time of MI.  Marland Kitchen CAD (coronary artery disease) 12/02/2018   a. 100% proximal LAD insetting of anterior STEMI--PCI: Resolute DES 3.0 mm x 22 mm (postdilated 3.5-3.1 mm).  Also 50% proximal RI.  Left dominant system.  Normal EF by LV gram.  . Cardiogenic shock ([place]) 12/2018   Mild shock complicating anterior STEMI; did not require mechanical support  . Essential hypertension 12/02/2018  . Hyperlipidemia with target LDL less than 70 12/02/2018  . Mild dilation of ascending aorta ([place])    a. by echo 12/2018.  . Pre-diabetes     PAST SURGICAL HISTORY   Past Surgical History:  Procedure Laterality Date  . CERVICAL SPINE SURGERY    . CORONARY/GRAFT ACUTE MI REVASCULARIZATION N/A 12/02/2018   Procedure: Coronary/Graft Acute MI Revascularization-CORONARY STENT INTERVENTION;  Surgeon: [name], MD;  Location: [facility];  Service: Cardiovascular;; p-mLAD 100% w/ 50% D1 ostial dz ( PCI - Resolute Onyx DES 3.0 mm x 32 mm --postdilated 3.5-3.1 mm).  . LEFT HEART CATH AND CORONARY ANGIOGRAPHY N/A 12/02/2018   Procedure: LEFT HEART CATH AND CORONARY ANGIOGRAPHY;  Surgeon: [name], MD;  Location: [facility]; INDICATION-ANTERIOR STEMI: p-mLAD 100% w/ 50% D1 ostial dz (DES PCI).  Moderate RI 55 %.  EF 55 to 60% (preliminary LV gram showed anterior hypokinesis, resolved following PCI).  Severely elevated LVEDP of 29 mmHg (acute diasto  . REVISION TOTAL HIP ARTHROPLASTY Left 2003  .  TOTAL HIP ARTHROPLASTY Left 2000  . TRANSTHORACIC ECHOCARDIOGRAM  12/02/2018   (In setting of anterior STEMI) EF 50-55%.  Apical septal and apical anterior akinesis.  Moderate LVH.  GR 1 DD.  Normal LA and RA size.  No valvular disease.  Mildly dilated ascending aorta of 39 mm.     MEDICATIONS/ALLERGIES   Current Meds  Medication Sig  . aspirin 81 MG chewable tablet Chew 1 tablet (81 mg total) by mouth daily.  . carvedilol (COREG) 6.25 MG tablet Take 1 tablet (6.25 mg total) by mouth 2 (two) times daily with a meal.  . nitroGLYCERIN (NITROSTAT) 0.4 MG SL tablet Place 1 tablet (0.4 mg total) under the tongue every 5 (five) minutes as needed for chest pain.  . rosuvastatin (CRESTOR) 40 MG tablet Take 1 tablet (40 mg total) by mouth daily.  . sildenafil (REVATIO) 20 MG tablet Take 20 mg by mouth daily as needed for erectile dysfunction.  . terbinafine (LAMISIL) 250 MG tablet Take 250 mg by mouth daily. Take daily for one week each month  . ticagrelor (BRILINTA) 90 MG TABS tablet Take 1 tablet (90 mg  total) by mouth 2 (two) times daily.  . valsartan (DIOVAN) 80 MG tablet Take 1 tablet (80 mg total) by mouth daily.    Allergies  Allergen Reactions  . Food     Lactose intolerant, no dairy products  . Lisinopril Cough  . Pollen Extract      SOCIAL HISTORY/FAMILY HISTORY   Social History   Tobacco Use  . Smoking status: Never Smoker  . Smokeless tobacco: Never Used  Substance Use Topics  . Alcohol use: Not on file  . Drug use: Not on file   Social History   Social History Narrative  . Not on file    Family History family history includes CAD in his mother and sister; Heart attack in his mother and sister.   OBJCTIVE -PE, EKG, labs   Wt Readings from Last 3 Encounters:  03/17/19 187 lb 14.4 oz (85.2 kg)  12/14/18 178 lb (80.7 kg)  12/04/18 175 lb 0.7 oz (79.4 kg)    Physical Exam: BP 137/88   Pulse 62   Temp (!) 97.1 F (36.2 C)   Ht 5\' 7"  (1.702 m)   Wt 187 lb  14.4 oz (85.2 kg)   SpO2 100%   BMI 29.43 kg/m  Physical Exam  Constitutional: He is oriented to person, place, and time. He appears well-developed and well-nourished. No distress.  Healthy-appearing.  Well-groomed.  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  Neck: No hepatojugular reflux and no JVD present. Carotid bruit is not present. No thyromegaly present.  Cardiovascular: Normal rate, regular rhythm, S1 normal and intact distal pulses.  No extrasystoles are present. PMI is not displaced. Exam reveals no gallop and no friction rub.  No murmur heard. Physiologically split S2 from RBBB  Pulmonary/Chest: Effort normal and breath sounds normal. No respiratory distress. He has no wheezes. He has no rales.  Musculoskeletal:        General: No edema. Normal range of motion.     Cervical back: Normal range of motion and neck supple.  Neurological: He is alert and oriented to person, place, and time.  Psychiatric: He has a normal mood and affect. His behavior is normal. Judgment and thought content normal.  Vitals reviewed.    Adult ECG Report  Rate: 62 ;  Rhythm: normal sinus rhythm and LAFB, incRBBB; evolving changes of anterior STEMI.  Less pronounced biphasic ST and T wave changes in anterolateral wall.-  Narrative Interpretation: Recent anterior infarct, with stable evolutionary changes of anterior STEMI  Recent Labs:   Lab Results  Component Value Date   CHOL 99 (L) 02/11/2019   HDL 33 (L) 02/11/2019   LDLCALC 48 02/11/2019   TRIG 94 02/11/2019   CHOLHDL 3.0 02/11/2019   Lab Results  Component Value Date   CREATININE 0.88 02/11/2019   BUN 12 02/11/2019   NA 142 02/11/2019   K 5.3 (H) 02/11/2019   CL 103 02/11/2019   CO2 24 02/11/2019    ASSESSMENT/PLAN    Problem List Items Addressed This Visit    STEMI involving left anterior descending coronary artery (HCC) (Chronic)    Relatively acute presentation with anterior MI  found to have LAD occlusion treated with a stent.  Mildly reduced EF with apical akinesis on follow-up echo, but essentially preserved EF with no further heart failure or angina symptoms.  Relatively favorable recovery.      Relevant Orders   EKG 12-Lead   Coronary artery disease involving native coronary  artery of native heart with unstable angina pectoris ([place]) - Primary (Chronic)    Really only had single-vessel disease with occluded LAD just prior to a major diagonal branch.  There is some moderate ostial disease in the diagonal which had TIMI-3 flow and therefore was not intervened on.  He has a left dominant system with some moderate disease in the ramus and medius as well.  No angina symptoms since discharge.  Plan: Continue current dose of rosuvastatin, carvedilol, irbesartan as well as DAPT (aspirin/ticagrelor)       Relevant Orders   EKG 12-Lead   Lipid panel   Comprehensive metabolic panel   Presence of drug coated stent in LAD coronary artery (Chronic)    32 mm DES stent in the proximal to mid LAD crossing a diagonal branch,placed in the setting of STEMI.  Plan will be for uninterrupted DAPT (aspirin and Brilinta) for 1 year (up to September 2021). ->  Would be okay to hold aspirin if there is bleeding issues.      Relevant Orders   EKG 12-Lead   Essential hypertension (Chronic)    Borderline pressure today.  Initial reading was 147/92, on my recheck was 137/88.  Resting heart rate 60, therefore not been to titrate carvedilol further, but could consider increasing valsartan to 160 mg if pressures remain at this level.      Relevant Orders   EKG 12-Lead   Hyperlipidemia with target LDL less than 70 (Chronic)    Most recent labs from December showed excellent control of LDL and total cholesterol.  LDL is less than 50. Would like to continue current dose of rosuvastatin.  If he maintains well-controlled lipids in the future, may consider backing down to 20 mg.  We  will check repeat labs prior to follow-up in 4 to 5 months.      Relevant Orders   EKG 12-Lead   Lipid panel   Comprehensive metabolic panel   Mild dilation of ascending aorta (HCC) (Chronic)    Plan is to reassess echo next year      Relevant Orders   EKG [id number]   Acute diastolic heart failure ([place])    In the setting of his anterior STEMI had acute diastolic heart failure which seems to have resolved now.  Is not having any further heart failure symptoms.  No PND orthopnea.  No longer requiring any diuretic.  He is on ARB and carvedilol at stable doses. ->  May be able to titrate ARB further if pressures remain borderline elevated, however resting heart rate of 62 would preclude titration of beta-blocker.          COVID-19 Education: The signs and symptoms of COVID-19 were discussed with the patient and how to seek care for testing (follow up with PCP or arrange E-visit).   The importance of social distancing was discussed today.  I spent a total of 68minutes with the patient and chart review. >  50% of the time was spent in direct patient consultation.  Additional time spent with chart review (studies, outside notes, etc): 10 Total Time: 32 min   Current medicines are reviewed at length with the patient today.  (+/- concerns) had been told to cut statin in 1/2 --> would prefer that he stay on 40 mg   Patient Instructions / Medication Changes & Studies & Tests Ordered   Patient Instructions  Medication Instructions:   increase Rosuvastatin back to 40 mg daily  No other changes *If you need a  refill on your cardiac medications before your next appointment, please call your pharmacy*  Lab Work:  be prepared to have labs ( cholesterol , CMP)  drawn at next appointment with Dr Ellyn Hack If you have labs (blood work) drawn today and your tests are completely normal, you will receive your results only by: Marland Kitchen MyChart Message (if you have MyChart) OR . A paper copy in the  mail If you have any lab test that is abnormal or we need to change your treatment, we will call you to review the results.  Testing/Procedures: Not needed  Follow-Up: At [facility], you and your health needs are our priority.  As part of our continuing mission to provide you with exceptional heart care, we have created designated Provider Care Teams.  These Care Teams include your primary Cardiologist (physician) and Advanced Practice Providers (APPs -  Physician Assistants and Nurse Practitioners) who all work together to provide you with the care you need, when you need it.  Your next appointment:   4 to 5  month(s)  The format for your next appointment:   In Person  Provider:   [name], MD  Other Instructions Please monitor Blood pressure, if consistently stay above 140/90 contact office.    Studies Ordered:   Orders Placed This Encounter  Procedures  . Lipid panel  . Comprehensive metabolic panel  . EKG 12-Lead     [name], M.D., M.S. Interventional Cardiologist   Pager # [phone] Phone # [phone] [address] [place], [place] [address]   Thank you for choosing [facility] at [facility]!!

## 2019-03-17 NOTE — Assessment & Plan Note (Signed)
32 mm DES stent in the proximal to mid LAD crossing a diagonal branch,placed in the setting of STEMI.  Plan will be for uninterrupted DAPT (aspirin and Brilinta) for 1 year (up to September 2021). ->  Would be okay to hold aspirin if there is bleeding issues.

## 2019-03-19 ENCOUNTER — Encounter: Payer: Self-pay | Admitting: Cardiology

## 2019-03-19 NOTE — Assessment & Plan Note (Signed)
Borderline pressure today.  Initial reading was 147/92, on my recheck was 137/88.  Resting heart rate 60, therefore not been to titrate carvedilol further, but could consider increasing valsartan to 160 mg if pressures remain at this level.

## 2019-03-19 NOTE — Assessment & Plan Note (Signed)
Really only had single-vessel disease with occluded LAD just prior to a major diagonal branch.  There is some moderate ostial disease in the diagonal which had TIMI-3 flow and therefore was not intervened on.  He has a left dominant system with some moderate disease in the ramus and medius as well.  No angina symptoms since discharge.  Plan: Continue current dose of rosuvastatin, carvedilol, irbesartan as well as DAPT (aspirin/ticagrelor)

## 2019-03-19 NOTE — Assessment & Plan Note (Signed)
Plan is to reassess echo next year

## 2019-03-19 NOTE — Assessment & Plan Note (Signed)
In the setting of his anterior STEMI had acute diastolic heart failure which seems to have resolved now.  Is not having any further heart failure symptoms.  No PND orthopnea.  No longer requiring any diuretic.  He is on ARB and carvedilol at stable doses. ->  May be able to titrate ARB further if pressures remain borderline elevated, however resting heart rate of 62 would preclude titration of beta-blocker.

## 2019-05-08 ENCOUNTER — Ambulatory Visit: Payer: 59 | Attending: Internal Medicine

## 2019-05-08 DIAGNOSIS — Z23 Encounter for immunization: Secondary | ICD-10-CM | POA: Insufficient documentation

## 2019-05-08 NOTE — Progress Notes (Signed)
   Covid-19 Vaccination Clinic  Name:  Zachary Flynn    MRN: [id number] DOB: May 06, 1960  05/08/2019  [name] was observed post Covid-19 immunization for 15 minutes without incident. He was provided with Vaccine Information Sheet and instruction to access the V-Safe system.   [name] was instructed to call 911 with any severe reactions post vaccine: Marland Kitchen Difficulty breathing  . Swelling of face and throat  . A fast heartbeat  . A bad rash all over body  . Dizziness and weakness   Immunizations Administered    Name Date Dose VIS Date Route   Pfizer COVID-19 Vaccine 05/08/2019  9:10 AM 0.3 mL 02/12/2019 Intramuscular   Manufacturer: [place]   Lot: [id number]   [place]: [id number]

## 2019-05-29 ENCOUNTER — Ambulatory Visit: Payer: 59 | Attending: Internal Medicine

## 2019-05-29 DIAGNOSIS — Z23 Encounter for immunization: Secondary | ICD-10-CM

## 2019-05-29 NOTE — Progress Notes (Signed)
   Covid-19 Vaccination Clinic  Name:  Zachary Flynn    MRN: [id number] DOB: [birth year]  05/29/2019  Mr. Zachary Flynn was observed post Covid-19 immunization for 15 minutes without incident. He was provided with Vaccine Information Sheet and instruction to access the V-Safe system.   Mr. Zachary Flynn was instructed to call 911 with any severe reactions post vaccine: Marland Kitchen Difficulty breathing  . Swelling of face and throat  . A fast heartbeat  . A bad rash all over body  . Dizziness and weakness   Immunizations Administered    Name Date Dose VIS Date Route   Pfizer COVID-19 Vaccine 05/29/2019  9:29 AM 0.3 mL 02/12/2019 Intramuscular   Manufacturer: [place]   Lot: [id number]   [place]: [id number]

## 2019-06-30 ENCOUNTER — Telehealth: Payer: Self-pay | Admitting: *Deleted

## 2019-06-30 NOTE — Telephone Encounter (Signed)
Called spoke to patient. Request if patient could change appt to 8:40 am  On 07/05/19. Patient was in agreement  Will need to be through by 12 noon.  RN states he would be out of office by then.  patient verbalized understanding and will come at new time.

## 2019-07-05 ENCOUNTER — Other Ambulatory Visit: Payer: Self-pay

## 2019-07-05 ENCOUNTER — Ambulatory Visit: Payer: 59 | Admitting: Cardiology

## 2019-07-05 ENCOUNTER — Encounter: Payer: Self-pay | Admitting: Cardiology

## 2019-07-05 VITALS — BP 152/88 | HR 57 | Ht 67.0 in | Wt 180.0 lb

## 2019-07-05 DIAGNOSIS — E785 Hyperlipidemia, unspecified: Secondary | ICD-10-CM

## 2019-07-05 DIAGNOSIS — I25110 Atherosclerotic heart disease of native coronary artery with unstable angina pectoris: Secondary | ICD-10-CM | POA: Diagnosis not present

## 2019-07-05 DIAGNOSIS — Z955 Presence of coronary angioplasty implant and graft: Secondary | ICD-10-CM | POA: Diagnosis not present

## 2019-07-05 DIAGNOSIS — I10 Essential (primary) hypertension: Secondary | ICD-10-CM

## 2019-07-05 DIAGNOSIS — I77810 Thoracic aortic ectasia: Secondary | ICD-10-CM

## 2019-07-05 DIAGNOSIS — R7303 Prediabetes: Secondary | ICD-10-CM

## 2019-07-05 LAB — LIPID PANEL
Chol/HDL Ratio: 2.8 ratio (ref 0.0–5.0)
Cholesterol, Total: 82 mg/dL — ABNORMAL LOW (ref 100–199)
HDL: 29 mg/dL — ABNORMAL LOW (ref >39)
LDL Chol Calc (NIH): 40 mg/dL (ref 0–99)
Triglycerides: 51 mg/dL (ref 0–149)
VLDL Cholesterol Cal: 13 mg/dL (ref 5–40)

## 2019-07-05 LAB — COMPREHENSIVE METABOLIC PANEL
ALT: 34 IU/L (ref 0–44)
AST: 30 IU/L (ref 0–40)
Albumin/Globulin Ratio: 2.3 — ABNORMAL HIGH (ref 1.2–2.2)
Albumin: 4.5 g/dL (ref 3.8–4.9)
Alkaline Phosphatase: 83 IU/L (ref 39–117)
BUN/Creatinine Ratio: 12 (ref 10–24)
BUN: 13 mg/dL (ref 8–27)
Bilirubin Total: 1 mg/dL (ref 0.0–1.2)
CO2: 23 mmol/L (ref 20–29)
Calcium: 9.7 mg/dL (ref 8.6–10.2)
Chloride: 103 mmol/L (ref 96–106)
Creatinine, Ser: 1.07 mg/dL (ref 0.76–1.27)
GFR calc Af Amer: 87 mL/min/{1.73_m2} (ref >59)
GFR calc non Af Amer: 75 mL/min/{1.73_m2} (ref >59)
Globulin, Total: 2 g/dL (ref 1.5–4.5)
Glucose: 142 mg/dL — ABNORMAL HIGH (ref 65–99)
Potassium: 4.7 mmol/L (ref 3.5–5.2)
Sodium: 140 mmol/L (ref 134–144)
Total Protein: 6.5 g/dL (ref 6.0–8.5)

## 2019-07-05 MED ORDER — VALSARTAN 160 MG PO TABS: 160.0000 mg | ORAL_TABLET | Freq: Every day | ORAL | 3 refills | Status: DC

## 2019-07-05 NOTE — Progress Notes (Signed)
Primary Care Provider: Lawerance Cruel, MD Cardiologist: [name], MD Electrophysiologist:   Clinic Note: Chief Complaint  Patient presents with  . Follow-up    Doing well.  No complaints.  . Coronary Artery Disease    No angina    HPI:    Zachary Flynn is a 59 y.o. male with a PMH notable for acute acute anterior STEMI in September 2020 with PCI to the LAD along with hypertension, hyperlipidemia and prediabetes who presents today for ~28-month follow-up. -->  Plan follow-up echo in 1 year to reevaluate ascending aorta.   Recent Hospitalizations:   None  Zachary Flynn was last seen in January.  He was doing very well.  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  He does continue to mention that his blood pressures at home are better than what we get here in the clinic.  He told me his pressures at home are in the 120s to 130s over 70s. --> We increased his rosuvastatin back up to 40 mg daily.  Asked that he monitor his blood pressures.   Reviewed  CV studies:    The following studies were reviewed today: (if available, images/films reviewed: From Epic Chart or Care Everywhere) . none   Interval History:   Zachary Flynn returns today doing well.  They have been having a competition at work for who can be more active, so he has increased his walking to 14,000 steps a day, and is otherwise occasionally doing some strength and conditioning exercises. He has been monitoring his blood pressure at home, and said just just yesterday his blood pressure is 132/70 mmHg.  He is little concerned about being aggressively treating his pressures because he has had some episodes of orthostatic dizziness.  He also feels his heart rate going up when he stands up.  But otherwise seems to doing fine and the only complaint is that he notes that he bleeds more when he cuts himself shaving.  No other cardiac  symptoms.  Cardiovascular Review of Symptoms (Summary): no chest pain or dyspnea on exertion negative for - edema, irregular heartbeat, orthopnea, palpitations, paroxysmal nocturnal dyspnea, rapid heart rate, shortness of breath or syncope / near syncope; TIA/amaurosis fugax, claudication, melena, hematochezia, hematuria.  The patient does not have symptoms concerning for COVID-19 infection (fever, chills, cough, or new shortness of breath).  The patient is practicing social distancing & Masking.     ++ COVID Vaccine x 2 ([facility]izer)   REVIEWED OF SYSTEMS   A comprehensive ROS was performed. Review of Systems  Constitutional: Negative for malaise/fatigue and weight loss.  HENT: Negative for nosebleeds.   Respiratory: Negative for shortness of breath.   Cardiovascular: Negative for claudication.  Gastrointestinal: Negative for blood in stool, constipation, heartburn and melena.  Genitourinary: Negative for dysuria, flank pain and hematuria.  Musculoskeletal: Positive for back pain (R side LBP - with sciatica off & on). Negative for falls, joint pain and myalgias.  Neurological: Negative for dizziness, focal weakness, weakness and headaches.  Endo/Heme/Allergies: Bruises/bleeds easily (i.e. cutting when shaving,).  Psychiatric/Behavioral: Negative for memory loss. The patient is not nervous/anxious and does not have insomnia.   All other systems reviewed and are negative.  I have reviewed and (if needed) personally updated the patient's problem list, medications, allergies, past medical and surgical history, social and family history.   PAST MEDICAL HISTORY  Past Medical History:  Diagnosis Date  . Acute diastolic CHF (congestive heart failure) ([place])    a. LVEDP elevated at time of MI.  Marland Kitchen CAD (coronary artery disease) 12/02/2018   a. 100% proximal LAD insetting of anterior STEMI--PCI: Resolute DES 3.0 mm x 22 mm (postdilated 3.5-3.1 mm).  Also 50% proximal RI.  Left dominant system.   Normal EF by LV gram.  . Cardiogenic shock ([place]) 12/2018   Mild shock complicating anterior STEMI; did not require mechanical support  . Essential hypertension 12/02/2018  . Hyperlipidemia with target LDL less than 70 12/02/2018  . Mild dilation of ascending aorta ([place])    a. by echo 12/2018.  . Pre-diabetes     PAST SURGICAL HISTORY   Past Surgical History:  Procedure Laterality Date  . CERVICAL SPINE SURGERY    . CORONARY/GRAFT ACUTE MI REVASCULARIZATION N/A 12/02/2018   Procedure: Coronary/Graft Acute MI Revascularization-CORONARY STENT INTERVENTION;  Surgeon: [name], MD;  Location: [place];  Service: Cardiovascular;; p-mLAD 100% w/ 50% D1 ostial dz ( PCI - Resolute Onyx DES 3.0 mm x 32 mm --postdilated 3.5-3.1 mm).  . LEFT HEART CATH AND CORONARY ANGIOGRAPHY N/A 12/02/2018   Procedure: LEFT HEART CATH AND CORONARY ANGIOGRAPHY;  Surgeon: [name], MD;  Location: [facility]; INDICATION-ANTERIOR STEMI: p-mLAD 100% w/ 50% D1 ostial dz (DES PCI).  Moderate RI 55 %.  EF 55 to 60% (preliminary LV gram showed anterior hypokinesis, resolved following PCI).  Severely elevated LVEDP of 29 mmHg (acute diasto  . REVISION TOTAL HIP ARTHROPLASTY Left 2003  . TOTAL HIP ARTHROPLASTY Left 2000  . TRANSTHORACIC ECHOCARDIOGRAM  12/02/2018   (In setting of anterior STEMI) EF 50-55%.  Apical septal and apical anterior akinesis.  Moderate LVH.  GR 1 DD.  Normal LA and RA size.  No valvular disease.  Mildly dilated ascending aorta of 39 mm.   . Cardiac Cath-PCI 12/02/2018: p-mLAD 100% w/ 50% D1 ostial dz ( PCI - Resolute Onyx DES 3.0 mm x 32 mm --postdilated 3.5-3.1 mm).  Moderate RI 55 %.  EF 55 to 60% (preliminary LV gram showed anterior hypokinesis, resolved following PCI).  Severely elevated LVEDP of 29 mmHg (acute diastolic heart failure).  `   MEDICATIONS/ALLERGIES   Current Meds  Medication Sig  . aspirin 81 MG chewable tablet Chew 1 tablet (81 mg total) by mouth  daily.  . carvedilol (COREG) 6.25 MG tablet Take 1 tablet (6.25 mg total) by mouth 2 (two) times daily with a meal.  . nitroGLYCERIN (NITROSTAT) 0.4 MG SL tablet Place 1 tablet (0.4 mg total) under the tongue every 5 (five) minutes as needed for chest pain.  . sildenafil (REVATIO) 20 MG tablet Take 20 mg by mouth daily as needed for erectile dysfunction.  . terbinafine (LAMISIL) 250 MG tablet Take 250 mg by mouth daily. Take daily for one week each month  . ticagrelor (BRILINTA) 90 MG TABS tablet Take 1 tablet (90 mg total) by mouth 2 (two) times daily.  . [DISCONTINUED] rosuvastatin (CRESTOR) 40 MG tablet Take 1 tablet (40 mg total) by mouth daily.  . [DISCONTINUED] valsartan (DIOVAN) 80 MG tablet Take 1 tablet (80 mg total) by mouth daily.    Allergies  Allergen Reactions  . Food     Lactose intolerant, no dairy products  . Lisinopril Cough  . Pollen Extract      SOCIAL HISTORY/FAMILY HISTORY   Social History   Tobacco Use  . Smoking status:  Never Smoker  . Smokeless tobacco: Never Used  Substance Use Topics  . Alcohol use: Not on file  . Drug use: Not on file   Social History   Social History Narrative  . Not on file    Family History family history includes CAD in his mother and sister; Heart attack in his mother and sister.   OBJCTIVE -PE, EKG, labs   Wt Readings from Last 3 Encounters:  07/05/19 180 lb (81.6 kg)  03/17/19 187 lb 14.4 oz (85.2 kg)  12/14/18 178 lb (80.7 kg)    Physical Exam: BP (!) 152/88   Pulse (!) 57   Ht 5\' 7"  (1.702 m)   Wt 180 lb (81.6 kg)   SpO2 99%   BMI 28.19 kg/m  Physical Exam  Constitutional: He is oriented to person, place, and time. He appears well-developed and well-nourished. No distress.  Healthy-appearing.  Well-groomed.  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  Neck: No hepatojugular reflux and no JVD present. Carotid bruit is not present. No thyromegaly present.    Cardiovascular: Normal rate, regular rhythm, S1 normal and intact distal pulses.  No extrasystoles are present. PMI is not displaced. Exam reveals no gallop and no friction rub.  No murmur heard. S2 physiologically split from RBBB.  Pulmonary/Chest: Effort normal and breath sounds normal. No respiratory distress. He has no wheezes. He has no rales.  Musculoskeletal:        General: No edema. Normal range of motion.     Cervical back: Normal range of motion and neck supple.  Neurological: He is alert and oriented to person, place, and time.  Psychiatric: He has a normal mood and affect. His behavior is normal. Judgment and thought content normal.  Vitals reviewed.    Adult ECG Report Not checked  Recent Labs:   Lab Results  Component Value Date   CHOL 82 (L) 07/05/2019   HDL 29 (L) 07/05/2019   LDLCALC 40 07/05/2019   TRIG 51 07/05/2019   CHOLHDL 2.8 07/05/2019   Lab Results  Component Value Date   CREATININE 1.07 07/05/2019   BUN 13 07/05/2019   NA 140 07/05/2019   K 4.7 07/05/2019   CL 103 07/05/2019   CO2 23 07/05/2019    ASSESSMENT/PLAN    Problem List Items Addressed This Visit    Coronary artery disease involving native coronary artery of native heart with unstable angina pectoris ([place]) - Primary (Chronic)    Severe single-vessel disease with occluded LAD just prior to diagonal branch.  Treated with a single stent.  Mild disease elsewhere.  No further angina.  Had a relatively good recovery.  Plan:  Continue DAPT -> okay to hold aspirin for significant bleeding, but would prefer to avoid holding Brilinta until October.  Seems to be tolerating being back on 40 mg simvastatin.  He is on carvedilol and valsartan.  With borderline elevated pressures, I will have him increase his valsartan to 180 mg.  (He has been on irbesartan, and perhaps the conversion to dip valsartan and did not conclude with appropriate dosing)      Relevant Medications   valsartan (DIOVAN)  160 MG tablet   Presence of drug coated stent in LAD coronary artery (Chronic)    32 mm DES stent to the LAD that is essentially ostial to proximal crossing the diagonal branch.  He needs to be on uninterrupted Thienopyridine (Brilinta) for at least 1 year.  He is okay for him  to hold his aspirin for significant bleeding or bruising at this point.  As of October 2021, would be okay to hold Brilinta 5 to 7 days preop for any surgeries or procedures.  Otherwise it would be reasonably acceptable to hold for urgent or emergent procedures. Also as of October 2021, plan to reduce to 60 mg twice daily Brilinta.  Would probably then continue with that dose or convert to Plavix for lifelong antiplatelet agent.      Essential hypertension (Chronic)    He acknowledges that that at home his pressures have been up a little bit more in the 130 to 140 mmHg range with systolic pressures which puts him in stage I-II hypertension. His resting heart rate is relatively close we cannot titrate carvedilol further.  Plan: Increase valsartan 160 mg daily.  Continue to monitor blood pressure intermittently at home      Relevant Medications   valsartan (DIOVAN) 160 MG tablet   Hyperlipidemia with target LDL less than 70 (Chronic)    Labs checked this morning after clinic.  On review, these numbers look outstanding.  LDL is now back to 40 from 88.  This is simply with increasing to 40 mg of rosuvastatin.  For now would stay on current dose of rosuvastatin at 40 mg.  I suspect in the future we may delayed cut down a little bit but would like to have adequate control for least a year.      Relevant Medications   valsartan (DIOVAN) 160 MG tablet   Other Relevant Orders   Lipid panel (Completed)   Comprehensive metabolic panel (Completed)   Mild dilation of ascending aorta (HCC) (Chronic)    Plan was to recheck echocardiogram this fall.  We can order at next follow-up visit.  This allows to reassess EF and also his  ascending aorta.  If it is increased, would probably follow-up with CTA going forward.  Needs blood pressure control lipid control.      Relevant Medications   valsartan (DIOVAN) 160 MG tablet   Other Relevant Orders   ECHOCARDIOGRAM COMPLETE   Pre-diabetes   Relevant Orders   Comprehensive metabolic panel (Completed)       COVID-19 Education: The signs and symptoms of COVID-19 were discussed with the patient and how to seek care for testing (follow up with PCP or arrange E-visit).   The importance of social distancing was discussed today.  I spent a total of 18 minutes with the patient and chart review. >  50% of the time was spent in direct patient consultation.  Additional time spent with chart review (studies, outside notes, etc): 8 Total Time: 26 min   Current medicines are reviewed at length with the patient today.  (+/- concerns) had been told to cut statin in 1/2 --> would prefer that he stay on 40 mg   Patient Instructions / Medication Changes & Studies & Tests Ordered   Patient Instructions  Medication Instructions:  STOP TAKING VALSARTAN 80 MG  START TAKING  VALSARTAN 160 MG ONE DAILY  *If you need a refill on your cardiac medications before your next appointment, please call your pharmacy*   Lab Work: Lipid cmp - fasting   If you have labs (blood work) drawn today and your tests are completely normal, you will receive your results only by: Marland Kitchen MyChart Message (if you have MyChart) OR . A paper copy in the mail If you have any lab test that is abnormal or we need to change your  treatment, we will call you to review the results.   Testing/Procedures: Will be schedule at [facility] [address]- Late sept 2021 Your physician has requested that you have an echocardiogram. Echocardiography is a painless test that uses sound waves to create images of your heart. It provides your doctor with information about the size and shape of your heart and how  well your heart's chambers and valves are working. This procedure takes approximately one hour. There are no restrictions for this procedure.     Follow-Up: At [facility], you and your health needs are our priority.  As part of our continuing mission to provide you with exceptional heart care, we have created designated Provider Care Teams.  These Care Teams include your primary Cardiologist (physician) and Advanced Practice Providers (APPs -  Physician Assistants and Nurse Practitioners) who all work together to provide you with the care you need, when you need it.  We recommend signing up for the patient portal called "MyChart".  Sign up information is provided on this After Visit Summary.  MyChart is used to connect with patients for Virtual Visits (Telemedicine).  Patients are able to view lab/test results, encounter notes, upcoming appointments, etc.  Non-urgent messages can be sent to your provider as well.   To learn more about what you can do with MyChart, go to [email or web address].    Your next appointment:   5 month(s) early oct 2021  The format for your next appointment:   In Person  Provider:   [name], MD   Other Instructions n/a    Studies Ordered:   Orders Placed This Encounter  Procedures  . Lipid panel  . Comprehensive metabolic panel  . ECHOCARDIOGRAM COMPLETE     [name], M.D., M.S. Interventional Cardiologist   Pager # [phone] Phone # [phone] [address] [place], [place] [address]   Thank you for choosing Heartcare at [facility]!!

## 2019-07-05 NOTE — Patient Instructions (Signed)
Medication Instructions:  STOP TAKING VALSARTAN 80 MG  START TAKING  VALSARTAN 160 MG ONE DAILY  *If you need a refill on your cardiac medications before your next appointment, please call your pharmacy*   Lab Work: Lipid cmp - fasting   If you have labs (blood work) drawn today and your tests are completely normal, you will receive your results only by: Marland Kitchen MyChart Message (if you have MyChart) OR . A paper copy in the mail If you have any lab test that is abnormal or we need to change your treatment, we will call you to review the results.   Testing/Procedures: Will be schedule at [facility] [address]- Late sept 2021 Your physician has requested that you have an echocardiogram. Echocardiography is a painless test that uses sound waves to create images of your heart. It provides your doctor with information about the size and shape of your heart and how well your heart's chambers and valves are working. This procedure takes approximately one hour. There are no restrictions for this procedure.     Follow-Up: At [facility], you and your health needs are our priority.  As part of our continuing mission to provide you with exceptional heart care, we have created designated Provider Care Teams.  These Care Teams include your primary Cardiologist (physician) and Advanced Practice Providers (APPs -  Physician Assistants and Nurse Practitioners) who all work together to provide you with the care you need, when you need it.  We recommend signing up for the patient portal called "MyChart".  Sign up information is provided on this After Visit Summary.  MyChart is used to connect with patients for Virtual Visits (Telemedicine).  Patients are able to view lab/test results, encounter notes, upcoming appointments, etc.  Non-urgent messages can be sent to your provider as well.   To learn more about what you can do with MyChart, go to [email or web address].    Your next appointment:    5 month(s) early oct 2021  The format for your next appointment:   In Person  Provider:   [name], MD   Other Instructions n/a

## 2019-07-07 ENCOUNTER — Encounter: Payer: Self-pay | Admitting: Cardiology

## 2019-07-07 ENCOUNTER — Telehealth: Payer: Self-pay | Admitting: *Deleted

## 2019-07-07 MED ORDER — ROSUVASTATIN CALCIUM 20 MG PO TABS: 20.0000 mg | ORAL_TABLET | Freq: Every day | ORAL | 3 refills | Status: DC

## 2019-07-07 NOTE — Assessment & Plan Note (Signed)
32 mm DES stent to the LAD that is essentially ostial to proximal crossing the diagonal branch.  He needs to be on uninterrupted Thienopyridine (Brilinta) for at least 1 year.  He is okay for him to hold his aspirin for significant bleeding or bruising at this point.  As of October 2021, would be okay to hold Brilinta 5 to 7 days preop for any surgeries or procedures.  Otherwise it would be reasonably acceptable to hold for urgent or emergent procedures. Also as of October 2021, plan to reduce to 60 mg twice daily Brilinta.  Would probably then continue with that dose or convert to Plavix for lifelong antiplatelet agent.

## 2019-07-07 NOTE — Assessment & Plan Note (Signed)
He acknowledges that that at home his pressures have been up a little bit more in the 130 to 140 mmHg range with systolic pressures which puts him in stage I-II hypertension. His resting heart rate is relatively close we cannot titrate carvedilol further.  Plan: Increase valsartan 160 mg daily.  Continue to monitor blood pressure intermittently at home

## 2019-07-07 NOTE — Assessment & Plan Note (Signed)
Plan was to recheck echocardiogram this fall.  We can order at next follow-up visit.  This allows to reassess EF and also his ascending aorta.  If it is increased, would probably follow-up with CTA going forward.  Needs blood pressure control lipid control.

## 2019-07-07 NOTE — Telephone Encounter (Signed)
The patient has been notified of the result and verbalized understanding.  All questions (if any) were answered.  patient aware to decrease rosuvastatin to 20 mg . New prescription sent to pharmacy.  Lab  Result  Routed to  Primary Dr Lavina Hamman.  Patient states he has an appointment  07/15/19 Raiford Simmonds, RN 07/07/2019 4:07 PM

## 2019-07-07 NOTE — Assessment & Plan Note (Signed)
Severe single-vessel disease with occluded LAD just prior to diagonal branch.  Treated with a single stent.  Mild disease elsewhere.  No further angina.  Had a relatively good recovery.  Plan:  Continue DAPT -> okay to hold aspirin for significant bleeding, but would prefer to avoid holding Brilinta until October.  Seems to be tolerating being back on 40 mg simvastatin.  He is on carvedilol and valsartan.  With borderline elevated pressures, I will have him increase his valsartan to 180 mg.  (He has been on irbesartan, and perhaps the conversion to dip valsartan and did not conclude with appropriate dosing)

## 2019-07-07 NOTE — Telephone Encounter (Signed)
-----   Message from [name], MD sent at 07/06/2019  1:12 PM EDT ----- Total cholesterol level is outstanding at 82 with LDL of 40.  --> We can reduce rosuvastatin dose to 1/2 tablet  Chemistry panel is pretty normal with exception of elevated blood sugar levels.  Glucose of 142 if his fasting glucose is concerning for diabetes.  We will forward labs to your primary physician, when you see him he may want to check something called an A1c and consider treatment for borderline early onset diabetes.  [name], MD

## 2019-07-07 NOTE — Assessment & Plan Note (Signed)
Labs checked this morning after clinic.  On review, these numbers look outstanding.  LDL is now back to 40 from 88.  This is simply with increasing to 40 mg of rosuvastatin.  For now would stay on current dose of rosuvastatin at 40 mg.  I suspect in the future we may delayed cut down a little bit but would like to have adequate control for least a year.

## 2019-07-16 ENCOUNTER — Encounter: Payer: Self-pay | Admitting: Gastroenterology

## 2019-08-04 ENCOUNTER — Other Ambulatory Visit: Payer: Self-pay

## 2019-08-04 ENCOUNTER — Encounter: Payer: Self-pay | Admitting: Gastroenterology

## 2019-08-04 ENCOUNTER — Ambulatory Visit (INDEPENDENT_AMBULATORY_CARE_PROVIDER_SITE_OTHER): Payer: 59 | Admitting: Gastroenterology

## 2019-08-04 VITALS — BP 150/90 | HR 82 | Ht 67.0 in | Wt 186.0 lb

## 2019-08-04 DIAGNOSIS — Z7902 Long term (current) use of antithrombotics/antiplatelets: Secondary | ICD-10-CM | POA: Diagnosis not present

## 2019-08-04 DIAGNOSIS — Z1211 Encounter for screening for malignant neoplasm of colon: Secondary | ICD-10-CM

## 2019-08-04 NOTE — Progress Notes (Signed)
HPI :  59 y.o. male with a PMH notable for acute acute anterior STEMI in September 2020 with PCI to the LAD along with hypertension, hyperlipidemia and prediabetes, referred by Dr. Lona Kettle for colon cancer screening.  Reportedly normal colonoscopy 2011, denies any history of polyps.  He denies any problems with his bowels.  No blood in his stools or diarrhea.  No abdominal pains that are persistent at all.  He does endorse history of lactose intolerance tries to avoid that.  He eats well, no nausea or vomiting.  He has no known family history of colon cancer.  His last hemoglobin was 13.5 with a normal MCV in December 2020.  He had an MI in September 2020 which led to coronary stent placement.  He had been on Brilinta and aspirin since that time and has had no problems with his heart or lungs.  He states he feels well.  Echocardiogram at that time showed an EF of 50 to 55%.  He has a very strong family history of coronary artery disease.  One of his sisters passed of an MI more than 10 years ago, he has 2 other sisters who have had stents placed in the heart and his mother had what sounds like a bypass surgery.  He is scheduled for another echocardiogram in September and was told he should not have interruption of his Brilinta use until at least October for elective procedures.  Echo 12/02/19 - EF 50-55%  Past Medical History:  Diagnosis Date  . Acute diastolic CHF (congestive heart failure) ([place]) 11/2018   a. LVEDP elevated at time of MI.  Marland Kitchen CAD (coronary artery disease) 12/02/2018   a. 100% proximal LAD insetting of anterior STEMI--PCI: Resolute DES 3.0 mm x 22 mm (postdilated 3.5-3.1 mm).  Also 50% proximal RI.  Left dominant system.  Normal EF by LV gram.  . Cardiogenic shock ([place]) 12/2018   Mild shock complicating anterior STEMI; did not require mechanical support  . Cervical disc disease   . Erectile dysfunction   . Essential hypertension 12/02/2018  . Hyperlipidemia with target LDL  less than 70 12/02/2018  . Mild dilation of ascending aorta ([place])    a. by echo 12/2018.  . Pre-diabetes      Past Surgical History:  Procedure Laterality Date  . CERVICAL SPINE SURGERY  2012   Dr. Ellene Route  . CORONARY/GRAFT ACUTE MI REVASCULARIZATION N/A 12/02/2018   Procedure: Coronary/Graft Acute MI Revascularization-CORONARY STENT INTERVENTION;  Surgeon: [name], MD;  Location: [facility];  Service: Cardiovascular;; p-mLAD 100% w/ 50% D1 ostial dz ( PCI - Resolute Onyx DES 3.0 mm x 32 mm --postdilated 3.5-3.1 mm).  . LEFT HEART CATH AND CORONARY ANGIOGRAPHY N/A 12/02/2018   Procedure: LEFT HEART CATH AND CORONARY ANGIOGRAPHY;  Surgeon: [name], MD;  Location: [facility]; INDICATION-ANTERIOR STEMI: p-mLAD 100% w/ 50% D1 ostial dz (DES PCI).  Moderate RI 55 %.  EF 55 to 60% (preliminary LV gram showed anterior hypokinesis, resolved following PCI).  Severely elevated LVEDP of 29 mmHg (acute diasto  . REVISION TOTAL HIP ARTHROPLASTY Left 2003  . TOTAL HIP ARTHROPLASTY Left 2000   Dr. Durward Fortes  . TRANSTHORACIC ECHOCARDIOGRAM  12/02/2018   (In setting of anterior STEMI) EF 50-55%.  Apical septal and apical anterior akinesis.  Moderate LVH.  GR 1 DD.  Normal LA and RA size.  No valvular disease.  Mildly dilated ascending aorta of 39 mm.   Family History  Problem Relation Age of Onset  . CAD Mother   . Heart attack Mother   . CAD Sister   . Heart attack Sister    Social History   Tobacco Use  . Smoking status: Never Smoker  . Smokeless tobacco: Never Used  Substance Use Topics  . Alcohol use: Never  . Drug use: Never   Current Outpatient Medications  Medication Sig Dispense Refill  . aspirin 81 MG chewable tablet Chew 1 tablet (81 mg total) by mouth daily. 90 tablet 3  . carvedilol (COREG) 6.25 MG tablet Take 1 tablet (6.25 mg total) by mouth 2 (two) times daily with a meal. 180 tablet 3  . nitroGLYCERIN (NITROSTAT) 0.4 MG SL tablet Place 1 tablet  (0.4 mg total) under the tongue every 5 (five) minutes as needed for chest pain. 100 tablet 3  . rosuvastatin (CRESTOR) 20 MG tablet Take 1 tablet (20 mg total) by mouth daily. (Patient taking differently: Take 10 mg by mouth daily. ) 90 tablet 3  . sildenafil (REVATIO) 20 MG tablet Take 20 mg by mouth daily as needed for erectile dysfunction.    . ticagrelor (BRILINTA) 90 MG TABS tablet Take 1 tablet (90 mg total) by mouth 2 (two) times daily. 180 tablet 3  . valsartan (DIOVAN) 160 MG tablet Take 1 tablet (160 mg total) by mouth daily. 90 tablet 3   No current facility-administered medications for this visit.   Allergies  Allergen Reactions  . Food     Lactose intolerant, no dairy products  . Lisinopril Cough  . Pollen Extract      Review of Systems: All systems reviewed and negative except where noted in HPI.    Lab Results  Component Value Date   WBC 5.1 02/11/2019   HGB 13.5 02/11/2019   HCT 42.0 02/11/2019   MCV 87 02/11/2019   PLT 175 02/11/2019    Physical Exam: BP (!) 150/90   Pulse 82   Ht 5\' 7"  (1.702 m)   Wt 186 lb (84.4 kg)   BMI 29.13 kg/m  Constitutional: Pleasant,well-developed, male in no acute distress. HEENT: Normocephalic and atraumatic. Conjunctivae are normal. No scleral icterus. Neck supple.  Cardiovascular: Normal rate, regular rhythm.  Pulmonary/chest: Effort normal and breath sounds normal. No wheezing, rales or rhonchi. Abdominal: Soft, nondistended, nontender. There are no masses palpable.  Extremities: no edema Lymphadenopathy: No cervical adenopathy noted. Neurological: Alert and oriented to person place and time. Skin: Skin is warm and dry. No rashes noted. Psychiatric: Normal mood and affect. Behavior is normal.   ASSESSMENT AND PLAN: 59 year old male here for new patient assessment of the following:  Colon cancer screening / antiplatelet use - the patient is due for routine colon cancer screening, I discussed options with him.   Optical colonoscopy is recommended if he can tolerate it.  He wishes to proceed with colonoscopy however given his antiplatelet use for his coronary stent, cardiology does not recommend interrupting his Brilinta use until after 1 year at least following stent placement.  As such he is not cleared to proceed with optical colonoscopy until October.  He will have an echocardiogram in September and see cardiology at that time.  If he is able to stop the Brilinta at that time he will contact me for scheduling and can proceed with his colonoscopy then.  Should he have any symptoms from his bowels in the interim or any other issues she should contact me for reassessment.  He agreed  McGregor Cellar, MD  [facility]  CC: Lawerance Cruel, MD

## 2019-08-31 ENCOUNTER — Telehealth: Payer: Self-pay

## 2019-08-31 NOTE — Telephone Encounter (Addendum)
   [facility] Pre-operative Risk Assessment    HEARTCARE STAFF: - Please ensure there is not already an duplicate clearance open for this procedure. - Under Visit Info/Reason for Call, type in Other and utilize the format Clearance MM/DD/YY or Clearance TBD. Do not use dashes or single digits. - If request is for dental extraction, please clarify the # of teeth to be extracted.  Request for surgical clearance:  1. What type of surgery is being performed?  Periodontal exam, 2 teeth extractions and scaling   2. When is this surgery scheduled? 09/24/2019   3. What type of clearance is required (medical clearance vs. Pharmacy clearance to hold med vs. Both)? Both  4. Are there any medications that need to be held prior to surgery and how long?  ASA, Brilinta how long prior to surgery to hold.  5. Practice name and name of physician performing surgery? Dr. Mikey Bussing DDS, Dr Mikey Bussing   6. What is the office phone number? [phone]   7.   What is the office fax number? [phone]  8.   Anesthesia type (None, local, MAC, general) ? Lidocaine 2% with Epi or Carbocaine 2% no Epi   Jacqulynn Cadet 08/31/2019, 4:03 PM  _________________________________________________________________   (provider comments below)

## 2019-08-31 NOTE — Telephone Encounter (Signed)
   Primary Cardiologist: [name], MD  Chart reviewed as part of pre-operative protocol coverage. Patient was last seen by Dr. Ellyn Hack 07/05/19. H/o CAD with anterior STEMI in 11/2018 with PCI to the LAD, HTN, HLD, pre-DM, ascending aorta dilation. Per cath note 11/2018, "Recommend dual antiplatelet therapy with Aspirin 81mg  daily and Ticagrelor 90mg  twice daily long-term (beyond 12 months) because of Proximal LAD stent. Okay to stop aspirin after 6 months.  After 12 months, would reduce to maintenance dose 60 mg twice daily ticagrelor."  Callback, please find out if dentist absolutely needs to hold blood thinners. Typically for 2 extractions and exams we do not hold antiplatelets but this would be helpful information to know as he had PCI within the last 12 months as above (not sure if scaling or specific exam impacts that request).  Charlie Pitter, PA-C 08/31/2019, 5:19 PM

## 2019-09-01 NOTE — Telephone Encounter (Signed)
Called and informed Dr Mikey Bussing, he states that he did not think this was possible but was just inquiring "to follow protocol and make sure", he will proceed with the extractions without stopping dual antiplatelet therapy with Aspirin 81mg .

## 2019-09-13 NOTE — Telephone Encounter (Signed)
Received a "2nd request" faxed for cardiac clearance. Tried to call requesting office to notify that I spoke with Dr Geralynn Ochs Wednesday 09-01-2019 about clearance. They are currently closed for lunch. So, I faxed again via epic fax function. Will await call back if anything else is needed.

## 2019-11-24 ENCOUNTER — Ambulatory Visit (HOSPITAL_COMMUNITY): Payer: 59 | Attending: Cardiology

## 2019-11-24 ENCOUNTER — Other Ambulatory Visit: Payer: Self-pay

## 2019-11-24 DIAGNOSIS — I77810 Thoracic aortic ectasia: Secondary | ICD-10-CM | POA: Diagnosis not present

## 2019-11-24 HISTORY — PX: TRANSTHORACIC ECHOCARDIOGRAM: SHX275

## 2019-11-24 LAB — ECHOCARDIOGRAM COMPLETE
Area-P 1/2: 3.34 cm2
S' Lateral: 3.5 cm

## 2019-12-13 ENCOUNTER — Telehealth: Payer: Self-pay | Admitting: *Deleted

## 2019-12-13 NOTE — Telephone Encounter (Signed)
Started prior authorization  For Brilinta 90 mg twice a day   Key [id number] DOB 2060-04-20  DX I25.110 USED  IT HAS BEEN THE ONLY MEDICATION USED AWAITING FOR  APPROVAL OR DENIAL

## 2019-12-24 ENCOUNTER — Other Ambulatory Visit: Payer: Self-pay

## 2019-12-24 ENCOUNTER — Ambulatory Visit: Payer: 59 | Admitting: Cardiology

## 2019-12-24 ENCOUNTER — Encounter: Payer: Self-pay | Admitting: Cardiology

## 2019-12-24 VITALS — BP 150/82 | HR 54 | Ht 67.0 in | Wt 186.2 lb

## 2019-12-24 DIAGNOSIS — Z955 Presence of coronary angioplasty implant and graft: Secondary | ICD-10-CM

## 2019-12-24 DIAGNOSIS — E785 Hyperlipidemia, unspecified: Secondary | ICD-10-CM | POA: Diagnosis not present

## 2019-12-24 DIAGNOSIS — I2102 ST elevation (STEMI) myocardial infarction involving left anterior descending coronary artery: Secondary | ICD-10-CM

## 2019-12-24 DIAGNOSIS — I25110 Atherosclerotic heart disease of native coronary artery with unstable angina pectoris: Secondary | ICD-10-CM | POA: Diagnosis not present

## 2019-12-24 DIAGNOSIS — I10 Essential (primary) hypertension: Secondary | ICD-10-CM | POA: Diagnosis not present

## 2019-12-24 DIAGNOSIS — I77810 Thoracic aortic ectasia: Secondary | ICD-10-CM

## 2019-12-24 MED ORDER — VALSARTAN 320 MG PO TABS: 320.0000 mg | ORAL_TABLET | Freq: Every day | ORAL | 11 refills | Status: DC

## 2019-12-24 MED ORDER — TICAGRELOR 60 MG PO TABS: 60.0000 mg | ORAL_TABLET | Freq: Two times a day (BID) | ORAL | 11 refills | Status: DC

## 2019-12-24 NOTE — Progress Notes (Signed)
Primary Care Provider: Lawerance Cruel, MD Cardiologist: [name], MD Electrophysiologist: None  Clinic Note: Chief Complaint  Patient presents with  . Follow-up    5-6 months; echo results  . Coronary Artery Disease    No angina   HPI:    Zachary Flynn is a 59 y.o. male with a PMH notable for anterior STEMI in September 2020 (mildly reduced EF by echo) who presents today for 59-month follow-up with new echocardiogram results.  Problem List Items Addressed This Visit    Coronary artery disease involving native coronary artery of native heart with unstable angina pectoris ([place]) - Primary (Chronic)-Presence of drug coated stent in LAD coronary artery (Chronic)   Anterior STEMI -(12/02/2018)   Essential hypertension (Chronic)   Hyperlipidemia with target LDL less than 70 (Chronic)   Mild dilation of ascending aorta (HCC) (Chronic)     Zachary Flynn was last seen on 07/05/2019.  Was doing well.  Was part of work competition about taking be more active.  Was walking up to  14,000 steps a day.  Otherwise also doing strengthening conditioning exercises.  Notes that his heart rate goes up when he stands up-concerned about overtreating blood pressure..  Easy bruising/bleeding noted.  Recent Hospitalizations: None  Reviewed  CV studies:    The following studies were reviewed today: (if available, images/films reviewed: From Epic Chart or Care Everywhere) . Transthoracic Echo 11/24/2019: EF 45 to 50%.  Mildly decreased function.  Hypokinesis of the mid-apical anteroseptal and apical wall.  GRII DD.  (Pseudonormalization).  Normal RV size and function.  Mildly dilated ascending aorta 43 mm.  Normal CVP.  Interval History:   Zachary Flynn returns here today stating that he is doing pretty well.  He says he has had cold with cold intolerance and feeling chilly of late, but no other major symptoms.  He is not quite as vigorous and is steps as he had been.  He is doing 8000 -12,000  steps a day but is still pretty active.  He  He remains asymptomatic from cardiac standpoint with no significant amount of orthostatic dizziness.  He still feels his heart rate going up when he stands up but it is less than it has been. He says his blood pressures at home which are still usually in the 130/80 range.  He was rushing to get in here today and has had to take his medications indicating that he has not had pressure this high in a while.  CV Review of Symptoms (Summary): no chest pain or dyspnea on exertion positive for - Little bit of positional dizziness, but notably improved. negative for - edema, irregular heartbeat, orthopnea, palpitations, paroxysmal nocturnal dyspnea, rapid heart rate, shortness of breath or Syncope/near syncope or TIA/amaurosis fugax, claudication  The patient does not have symptoms concerning for COVID-19 infection (fever, chills, cough, or new shortness of breath).   REVIEWED OF SYSTEMS   Review of Systems  Constitutional: Positive for chills (Little cold intolerance). Negative for malaise/fatigue and weight loss.  HENT: Negative for congestion and nosebleeds.   Respiratory: Negative for cough and shortness of breath.   Gastrointestinal: Negative for blood in stool and melena.  Genitourinary: Negative for hematuria.  Musculoskeletal: Positive for back pain (With radicular pain on the right side).  Neurological: Positive for dizziness (Notably improved) and tingling (He has right-sided sciatica pain). Negative for focal weakness (When he has sciatic pain, the right leg hurts and is weak.) and weakness.  Endo/Heme/Allergies: Bruises/bleeds  easily.  Psychiatric/Behavioral: Negative.    I have reviewed and (if needed) personally updated the patient's problem list, medications, allergies, past medical and surgical history, social and family history.   PAST MEDICAL HISTORY   Past Medical History:  Diagnosis Date  . CAD (coronary artery disease) 12/02/2018    a. 100% proximal LAD insetting of anterior STEMI--PCI: Resolute DES 3.0 mm x 22 mm (postdilated 3.5-3.1 mm).  Also 50% proximal RI.  Left dominant system.  Normal EF by LV gram.  . Cardiogenic shock ([place]) 12/2018   Mild shock complicating anterior STEMI; did not require mechanical support  . Cervical disc disease   . Chronic diastolic CHF (congestive heart failure), NYHA class 1 ([place]) 11/2018   a. LVEDP elevated at time of MI ->; GRII DD on follow-up echo  . Erectile dysfunction   . Essential hypertension 12/02/2018  . Hyperlipidemia with target LDL less than 70 12/02/2018  . Mild dilation of ascending aorta ([place])    a. by echo 12/2018.  . Pre-diabetes     PAST SURGICAL HISTORY   Past Surgical History:  Procedure Laterality Date  . CERVICAL SPINE SURGERY  2012   Dr. Ellene Route  . CORONARY/GRAFT ACUTE MI REVASCULARIZATION N/A 12/02/2018   Procedure: Coronary/Graft Acute MI Revascularization-CORONARY STENT INTERVENTION;  Surgeon: [name], MD;  Location: [facility];  Service: Cardiovascular;; p-mLAD 100% w/ 50% D1 ostial dz ( PCI - Resolute Onyx DES 3.0 mm x 32 mm --postdilated 3.5-3.1 mm).  . LEFT HEART CATH AND CORONARY ANGIOGRAPHY N/A 12/02/2018   Procedure: LEFT HEART CATH AND CORONARY ANGIOGRAPHY;  Surgeon: [name], MD;  Location: [place] [facility]; INDICATION-ANTERIOR STEMI: p-mLAD 100% w/ 50% D1 ostial dz (DES PCI).  Moderate RI 55 %.  EF 55 to 60% (preliminary LV gram showed anterior hypokinesis, resolved following PCI).  Severely elevated LVEDP of 29 mmHg (acute diasto  . REVISION TOTAL HIP ARTHROPLASTY Left 2003  . TOTAL HIP ARTHROPLASTY Left 2000   Dr. Durward Fortes  . TRANSTHORACIC ECHOCARDIOGRAM  12/02/2018   (In setting of anterior STEMI) EF 50-55%.  Apical septal and apical anterior akinesis.  Moderate LVH.  GR 1 DD.  Normal LA and RA size.  No valvular disease.  Mildly dilated ascending aorta of 39 mm.  . TRANSTHORACIC ECHOCARDIOGRAM  11/24/2019   EF 45 to  50%.  Mildly decreased function.  Hypokinesis of the mid-apical anteroseptal and apical wall.  GRII DD.  (Pseudonormalization).  Normal RV size and function.  Mildly dilated ascending aorta 43 mm.  Normal CVP.     Cardiac Cath-PCI 12/02/2018: p-mLAD 100% w/ 50% D1 ostial dz ( PCI - Resolute Onyx DES 3.0 mm x 32 mm --postdilated 3.5-3.1 mm).  Moderate RI 55 %.  EF 55 to 60% (preliminary LV gram showed anterior hypokinesis, resolved following PCI).  Severely elevated LVEDP of 29 mmHg (acute diastolic heart failure).  `  Immunization History  Administered Date(s) Administered  . PFIZER SARS-COV-2 Vaccination 05/08/2019, 05/29/2019  . Tdap 01/02/2012  . Zoster Recombinat (Shingrix) 07/15/2019   -> Is now considering getting his booster shot.  MEDICATIONS/ALLERGIES   Current Meds  Medication Sig  . carvedilol (COREG) 6.25 MG tablet Take 1 tablet (6.25 mg total) by mouth 2 (two) times daily with a meal.  . ketoconazole (NIZORAL) 2 % cream SMARTSIG:1 Application Topical 1 to 2 Times Daily  . nitroGLYCERIN (NITROSTAT) 0.4 MG SL tablet Place 1 tablet (0.4 mg total) under the tongue every 5 (five) minutes  as needed for chest pain.  . rosuvastatin (CRESTOR) 20 MG tablet Take 1 tablet (20 mg total) by mouth daily. (Patient taking differently: Take 10 mg by mouth daily. )  . sildenafil (REVATIO) 20 MG tablet Take 20 mg by mouth daily as needed for erectile dysfunction.  . [DISCONTINUED] aspirin 81 MG chewable tablet Chew 1 tablet (81 mg total) by mouth daily.  . [DISCONTINUED] ticagrelor (BRILINTA) 90 MG TABS tablet Take 1 tablet (90 mg total) by mouth 2 (two) times daily.  . [DISCONTINUED] valsartan (DIOVAN) 160 MG tablet Take 1 tablet (160 mg total) by mouth daily.  -> Medicines listed as discontinued either changed or reordered.  Aspirin discontinued today.  Allergies  Allergen Reactions  . Food     Lactose intolerant, no dairy products  . Lisinopril Cough  . Pollen Extract     SOCIAL  HISTORY/FAMILY HISTORY   Reviewed in Epic:  Pertinent findings: No major changes.  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  Constitutional:      General: He is not in acute distress.    Appearance: Normal appearance. He is normal weight. He is not ill-appearing.     Comments: Well-groomed.  Healthy-appearing.  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  HENT:     Head: Normocephalic and atraumatic.  Neck:     Vascular: No carotid bruit, hepatojugular reflux or JVD.  Cardiovascular:     Rate and Rhythm: Normal rate and regular rhythm.  No extrasystoles are present.    Chest Wall: PMI is not displaced.     Pulses: Normal pulses and intact distal pulses.     Heart sounds: S1 normal. No murmur heard.  No friction rub. No gallop.      Comments: Physiologically split S2 Pulmonary:     Effort: Pulmonary effort is normal. No respiratory distress.     Breath sounds: Normal breath sounds.  Chest:     Chest wall: No tenderness.  Abdominal:     General: Bowel sounds are normal. There is no distension.     Palpations: Abdomen is soft. There is no mass (No HSM).  Musculoskeletal:        General: No swelling. Normal range of motion.     Cervical back: Normal range of motion and neck supple.  Skin:    General: Skin is warm and dry.  Neurological:     General: No focal deficit present.     Mental Status: He is alert and oriented to person, place, and time. Mental status is at baseline.  Psychiatric:        Mood and Affect: Mood normal.        Behavior: Behavior normal.        Thought Content: Thought content normal.         Judgment: Judgment normal.      Adult ECG Report  Rate: 54 ;  Rhythm: Incomplete right bundle branch block (IRBBB), ~LAFB (-56  - vs. LAD with Sept MI, age undetermined), ~ LVH with repolarizaiton  changes -ST-2 abnormalities in the inferolateral leads with borderline ST elevations/biphasic anterior leads exclude ischemia.  --> Consistent with anterior inferior MI, evolving changes;   Narrative Interpretation: Relatively stable EKG.  Recent Labs:   Lab Results  Component Value Date   CHOL 82 (L) 07/05/2019   HDL 29 (L) 07/05/2019   LDLCALC 40 07/05/2019   TRIG 51 07/05/2019   CHOLHDL 2.8 07/05/2019   Lab Results  Component Value Date   CREATININE 1.07 07/05/2019   BUN 13 07/05/2019   NA 140 07/05/2019   K 4.7 07/05/2019   CL 103 07/05/2019   CO2 23 07/05/2019   Lab Results  Component Value Date   TSH 1.404 12/02/2018    ASSESSMENT/PLAN    Problem List Items Addressed This Visit    STEMI involving left anterior descending coronary artery (HCC) (Chronic)    Interestingly, he had a pretty acute presentation of anterior MI with LAD occlusion.  Initially the echo suggested normal EF which to me is a little unusual.  But pretty much preserved EF.    Follow-up echocardiogram is probably more accurate with mid apical anterior septal hypokinesis with a EF that is mildly reduced at 45 to 50%.  Pseudonormalization/grade 2 diastolic function noted.  Still favorable recovery with no active angina or heart failure symptoms.      Relevant Medications   valsartan (DIOVAN) 320 MG tablet   Coronary artery disease involving native coronary artery of native heart with unstable angina pectoris ([place]) - Primary (Chronic)    Sizable anterior infarct with Breeback stent placed.  Also existing 50% RI disease.  EF is mildly reduced with anterior hypokinesis consistent with MI. He is now over a year out from his MI.  Plan:   Continue statin, beta-blocker and ARB -> with increasing ARB dose  to320 mg  Discontinue aspirin  Reduce Brilinta to 60 mg twice daily for maintenance dose  Okay to hold Brilinta 5 days preop for procedures.      Relevant Medications   valsartan (DIOVAN) 320 MG tablet   Other Relevant Orders   EKG 12-Lead (Completed)   Hemoglobin A1c   Lipid panel   Comprehensive metabolic panel   Presence of drug coated stent in LAD coronary artery (Chronic)    3.0 mm x 22 mm DES stent in the LAD essentially ostial proximal with a jailed diagonal.  Has now completed 1 year of uninterrupted DAPT.  Would like to continue long-term Thienopyridine antiplatelet agent.  Plan:   D/C 81 mg aspirin  Reduce Brilinta to 60 mg twice daily  Okay to hold Brilinta 5 days preop for surgery of procedure  If approved by procedure physician, would like to substitute 81 mg aspirin for Brilinta while being held      Relevant Orders   Hemoglobin A1c   Lipid panel   Comprehensive metabolic panel   Essential hypertension (Chronic)    Blood pressure is here today is little high.  He is to continue to monitor at home.  He tells me at home usually in the 130/80 range.  I have asked that he monitor this over the next several months to let us know.  Plan: Increase valsartan to 320 mg daily.  With heart rate in the 50s, I do have room to increase carvedilol.  Next up would be diuretic      Relevant Medications   valsartan (DIOVAN) 320 MG tablet   Other Relevant Orders   EKG 12-Lead (Completed)   Hyperlipidemia with target  LDL less than 70 (Chronic)    Lipids look outstanding.  He is only on 20 mg of Crestor.  Recheck labs in 6 months.      Relevant Medications   valsartan (DIOVAN) 320 MG tablet   Other Relevant Orders   Hemoglobin A1c   Lipid panel   Comprehensive metabolic panel   Mild dilation of ascending aorta (HCC) (Chronic)    Current echo actually shows that the ascending thoracic aorta may be a little more dilated than last.  Will order follow-up CTA starting  next visit.  Continue blood pressure control.  His pressures today seem high but he tells me that this is unusual.  We will continue to monitor closely.  Continue aggressive lipid control.      Relevant Medications   valsartan (DIOVAN) 320 MG tablet   Other Relevant Orders   Hemoglobin A1c   Lipid panel   Comprehensive metabolic panel       [id number] Education: The signs and symptoms of COVID-19 were discussed with the patient and how to seek care for testing (follow up with PCP or arrange E-visit).   The importance of social distancing and COVID-19 vaccination was discussed today. 2 min The patient is practicing social distancing & Masking.   I spent a total of 70minutes with the patient spent in direct patient consultation.  Additional time spent with chart review  / charting (studies, outside notes, etc): 14 Total Time: 42 min   Current medicines are reviewed at length with the patient today.  (+/- concerns) n/a - bruising?   This visit occurred during the SARS-CoV-2 public health emergency.  Safety protocols were in place, including screening questions prior to the visit, additional usage of staff PPE, and extensive cleaning of exam room while observing appropriate contact time as indicated for disinfecting solutions.  Notice: This dictation was prepared with Dragon dictation along with smaller phrase technology. Any transcriptional errors that result from this process are unintentional and may not be corrected upon review.  Patient Instructions / Medication Changes & Studies & Tests Ordered   Patient Instructions  Medication Instructions:   stop taking Aspirin 81 mg  Increase Valsartan to 320 mg one tablet daily   Decrease Brilinta 60 mg twice a day - once you finish the 90 mg Brilinta  *If you need a refill on your cardiac medications before your next appointment, please call your pharmacy*   Lab Work: Labs in 6 month - April 2022 cmp Lipid- fasting   hgbA1c  If you have labs (blood work) drawn today and your tests are completely normal, you will receive your results only by: Marland Kitchen MyChart Message (if you have MyChart) OR . A paper copy in the mail If you have any lab test that is abnormal or we need to change your treatment, we will call you to review the results.   Testing/Procedures: Not needed   Follow-Up: At [facility], you and your health needs are our priority.  As part of our continuing mission to provide you with exceptional heart care, we have created designated Provider Care Teams.  These Care Teams include your primary Cardiologist (physician) and Advanced Practice Providers (APPs -  Physician Assistants and Nurse Practitioners) who all work together to provide you with the care you need, when you need it.  We recommend signing up for the patient portal called "MyChart".  Sign up information is provided on this After Visit Summary.  MyChart is used to connect with patients for Virtual  Visits (Telemedicine).  Patients are able to view lab/test results, encounter notes, upcoming appointments, etc.  Non-urgent messages can be sent to your provider as well.   To learn more about what you can do with MyChart, go to [email or web address].    Your next appointment:   6 month(s)  The format for your next appointment:   In Person  Provider:   [name], MD    Studies Ordered:   Orders Placed This Encounter  Procedures  . Hemoglobin A1c  . Lipid panel  . Comprehensive metabolic panel  . EKG 12-Lead     [name], M.D., M.S. Interventional Cardiologist   Pager # [phone] Phone # [phone] [place], [place] [address]   Thank you for choosing Heartcare at [facility]!!

## 2019-12-24 NOTE — Patient Instructions (Signed)
Medication Instructions:   stop taking Aspirin 81 mg  Increase Valsartan to 320 mg one tablet daily   Decrease Brilinta 60 mg twice a day - once you finish the 90 mg Brilinta  *If you need a refill on your cardiac medications before your next appointment, please call your pharmacy*   Lab Work: Labs in 6 month - April 2022 cmp Lipid- fasting  hgbA1c  If you have labs (blood work) drawn today and your tests are completely normal, you will receive your results only by: Marland Kitchen MyChart Message (if you have MyChart) OR . A paper copy in the mail If you have any lab test that is abnormal or we need to change your treatment, we will call you to review the results.   Testing/Procedures: Not needed   Follow-Up: At [facility], you and your health needs are our priority.  As part of our continuing mission to provide you with exceptional heart care, we have created designated Provider Care Teams.  These Care Teams include your primary Cardiologist (physician) and Advanced Practice Providers (APPs -  Physician Assistants and Nurse Practitioners) who all work together to provide you with the care you need, when you need it.  We recommend signing up for the patient portal called "MyChart".  Sign up information is provided on this After Visit Summary.  MyChart is used to connect with patients for Virtual Visits (Telemedicine).  Patients are able to view lab/test results, encounter notes, upcoming appointments, etc.  Non-urgent messages can be sent to your provider as well.   To learn more about what you can do with MyChart, go to [email or web address].    Your next appointment:   6 month(s)  The format for your next appointment:   In Person  Provider:   [name], MD

## 2019-12-30 ENCOUNTER — Encounter: Payer: Self-pay | Admitting: Cardiology

## 2019-12-30 NOTE — Assessment & Plan Note (Signed)
3.0 mm x 22 mm DES stent in the LAD essentially ostial proximal with a jailed diagonal.  Has now completed 1 year of uninterrupted DAPT.  Would like to continue long-term Thienopyridine antiplatelet agent.  Plan:   D/C 81 mg aspirin  Reduce Brilinta to 60 mg twice daily  Okay to hold Brilinta 5 days preop for surgery of procedure  If approved by procedure physician, would like to substitute 81 mg aspirin for Brilinta while being held

## 2019-12-30 NOTE — Assessment & Plan Note (Addendum)
Sizable anterior infarct with Breeback stent placed.  Also existing 50% RI disease.  EF is mildly reduced with anterior hypokinesis consistent with MI. He is now over a year out from his MI.  Plan:   Continue statin, beta-blocker and ARB -> with increasing ARB dose to320 mg  Discontinue aspirin  Reduce Brilinta to 60 mg twice daily for maintenance dose  Okay to hold Brilinta 5 days preop for procedures.

## 2019-12-30 NOTE — Assessment & Plan Note (Signed)
Lipids look outstanding.  He is only on 20 mg of Crestor.  Recheck labs in 6 months.

## 2019-12-30 NOTE — Assessment & Plan Note (Signed)
Current echo actually shows that the ascending thoracic aorta may be a little more dilated than last.  Will order follow-up CTA starting next visit.  Continue blood pressure control.  His pressures today seem high but he tells me that this is unusual.  We will continue to monitor closely.  Continue aggressive lipid control.

## 2019-12-30 NOTE — Assessment & Plan Note (Addendum)
Blood pressure is here today is little high.  He is to continue to monitor at home.  He tells me at home usually in the 130/80 range.  I have asked that he monitor this over the next several months to let us know.  Plan: Increase valsartan to 320 mg daily.  With heart rate in the 50s, I do have room to increase carvedilol.  Next up would be diuretic

## 2019-12-30 NOTE — Assessment & Plan Note (Signed)
Interestingly, he had a pretty acute presentation of anterior MI with LAD occlusion.  Initially the echo suggested normal EF which to me is a little unusual.  But pretty much preserved EF.    Follow-up echocardiogram is probably more accurate with mid apical anterior septal hypokinesis with a EF that is mildly reduced at 45 to 50%.  Pseudonormalization/grade 2 diastolic function noted.  Still favorable recovery with no active angina or heart failure symptoms.

## 2019-12-31 ENCOUNTER — Other Ambulatory Visit: Payer: Self-pay | Admitting: Adult Health

## 2020-01-04 ENCOUNTER — Other Ambulatory Visit: Payer: Self-pay | Admitting: Adult Health

## 2020-01-07 ENCOUNTER — Encounter: Payer: Self-pay | Admitting: Gastroenterology

## 2020-03-13 ENCOUNTER — Ambulatory Visit: Payer: 59 | Admitting: Gastroenterology

## 2020-03-13 ENCOUNTER — Encounter: Payer: Self-pay | Admitting: Gastroenterology

## 2020-03-13 VITALS — BP 130/82 | HR 66 | Ht 67.0 in | Wt 179.0 lb

## 2020-03-13 DIAGNOSIS — Z7902 Long term (current) use of antithrombotics/antiplatelets: Secondary | ICD-10-CM

## 2020-03-13 DIAGNOSIS — Z1211 Encounter for screening for malignant neoplasm of colon: Secondary | ICD-10-CM

## 2020-03-13 MED ORDER — SUTAB 1479-225-188 MG PO TABS: 1.0000 | ORAL_TABLET | Freq: Once | ORAL | 0 refills | Status: AC

## 2020-03-13 NOTE — Patient Instructions (Addendum)
If you are age 62 or older, your body mass index should be between 23-30. Your Body mass index is 28.04 kg/m. If this is out of the aforementioned range listed, please consider follow up with your Primary Care Provider.  If you are age 35 or younger, your body mass index should be between 19-25. Your Body mass index is 28.04 kg/m. If this is out of the aformentioned range listed, please consider follow up with your Primary Care Provider.    You have been scheduled for a colonoscopy. Please follow written instructions given to you at your visit today.  Please pick up your prep supplies at the pharmacy within the next 1-3 days. If you use inhalers (even only as needed), please bring them with you on the day of your procedure.  Please hold Brilinta 5 days prior to your procedure (starting on 1-27) and take an 81 mg Aspirin on those days.  Thank you for entrusting me with your care and for choosing [facility], Dr. Sangrey Cellar

## 2020-03-13 NOTE — Progress Notes (Signed)
HPI :  60 y/o male here for a follow up visit to discuss colonoscopy. PMH notable for acute acute anterior STEMI in September 2020 with PCI to the LAD. Reportedly normal colonoscopy 2011, denies any history of polyps.    I saw him last June at which time he was not cleared to hold his Brilinta, as he needed to complete at least 1 year of therapy.  He has seen his cardiologist and is now cleared to hold Brilinta for 5 days in order to do a colonoscopy.  He had a follow-up echocardiogram in September showing EF of 45 to 50%.  He is very active and denies any cardiopulmonary symptoms at this time.  No chest pain or shortness of breath.  Denies any problems with his bowels, no blood in his stools.  No abdominal pains. His last hemoglobin was 13.5 with a normal MCV in December 2020.  No family history of colon cancer that he is aware of.  Echo 11/24/19 - 45-50%    Past Medical History:  Diagnosis Date  . CAD (coronary artery disease) 12/02/2018   a. 100% proximal LAD insetting of anterior STEMI--PCI: Resolute DES 3.0 mm x 22 mm (postdilated 3.5-3.1 mm).  Also 50% proximal RI.  Left dominant system.  Normal EF by LV gram.  . Cardiogenic shock ([place]) 12/2018   Mild shock complicating anterior STEMI; did not require mechanical support  . Cervical disc disease   . Chronic diastolic CHF (congestive heart failure), NYHA class 1 ([place]) 11/2018   a. LVEDP elevated at time of MI ->; GRII DD on follow-up echo  . Erectile dysfunction   . Essential hypertension 12/02/2018  . Hyperlipidemia with target LDL less than 70 12/02/2018  . Mild dilation of ascending aorta ([place])    a. by echo 12/2018.  . Pre-diabetes      Past Surgical History:  Procedure Laterality Date  . CERVICAL SPINE SURGERY  2012   Dr. Ellene Route  . CORONARY/GRAFT ACUTE MI REVASCULARIZATION N/A 12/02/2018   Procedure: Coronary/Graft Acute MI Revascularization-CORONARY STENT INTERVENTION;  Surgeon: [name], MD;  Location: [facility];  Service: Cardiovascular;; p-mLAD 100% w/ 50% D1 ostial dz ( PCI - Resolute Onyx DES 3.0 mm x 32 mm --postdilated 3.5-3.1 mm).  . LEFT HEART CATH AND CORONARY ANGIOGRAPHY N/A 12/02/2018   Procedure: LEFT HEART CATH AND CORONARY ANGIOGRAPHY;  Surgeon: [name], MD;  Location: [facility]; INDICATION-ANTERIOR STEMI: p-mLAD 100% w/ 50% D1 ostial dz (DES PCI).  Moderate RI 55 %.  EF 55 to 60% (preliminary LV gram showed anterior hypokinesis, resolved following PCI).  Severely elevated LVEDP of 29 mmHg (acute diasto  . REVISION TOTAL HIP ARTHROPLASTY Left 2003  . TOTAL HIP ARTHROPLASTY Left 2000   Dr. Durward Fortes  . TRANSTHORACIC ECHOCARDIOGRAM  12/02/2018   (In setting of anterior STEMI) EF 50-55%.  Apical septal and apical anterior akinesis.  Moderate LVH.  GR 1 DD.  Normal LA and RA size.  No valvular disease.  Mildly dilated ascending aorta of 39 mm.  . TRANSTHORACIC ECHOCARDIOGRAM  11/24/2019   EF 45 to 50%.  Mildly decreased function.  Hypokinesis of the mid-apical anteroseptal and apical wall.  GRII DD.  (Pseudonormalization).  Normal RV size and function.  Mildly dilated ascending aorta 43 mm.  Normal CVP.   Family History  Problem Relation Age of Onset  . CAD Mother   . Heart attack Mother   . CAD Sister   .  Heart attack Sister   . Colon cancer Neg Hx   . Stomach cancer Neg Hx   . Pancreatic cancer Neg Hx   . Esophageal cancer Neg Hx    Social History   Tobacco Use  . Smoking status: Never Smoker  . Smokeless tobacco: Never Used  Vaping Use  . Vaping Use: Never used  Substance Use Topics  . Alcohol use: Never  . Drug use: Never   Current Outpatient Medications  Medication Sig Dispense Refill  . BRILINTA 90 MG TABS tablet TAKE ONE TABLET BY MOUTH TWICE A DAY (Patient taking differently: 60 mg. Patient takes twice daily) 60 tablet 2  . carvedilol (COREG) 6.25 MG tablet TAKE ONE TABLET BY MOUTH TWICE A DAY WITH A MEAL 180 tablet 2  . ketoconazole (NIZORAL) 2 %  cream SMARTSIG:1 Application Topical 1 to 2 Times Daily    . sildenafil (REVATIO) 20 MG tablet Take 20 mg by mouth daily as needed for erectile dysfunction.    . ticagrelor (BRILINTA) 60 MG TABS tablet Take 1 tablet (60 mg total) by mouth 2 (two) times daily. 60 tablet 11  . valsartan (DIOVAN) 320 MG tablet Take 1 tablet (320 mg total) by mouth daily. 30 tablet 11  . nitroGLYCERIN (NITROSTAT) 0.4 MG SL tablet Place 1 tablet (0.4 mg total) under the tongue every 5 (five) minutes as needed for chest pain. 100 tablet 3  . rosuvastatin (CRESTOR) 20 MG tablet Take 1 tablet (20 mg total) by mouth daily. (Patient taking differently: Take 10 mg by mouth daily. ) 90 tablet 3   No current facility-administered medications for this visit.   Allergies  Allergen Reactions  . Food     Lactose intolerant, no dairy products  . Lisinopril Cough  . Pollen Extract      Review of Systems: All systems reviewed and negative except where noted in HPI.   Lab Results  Component Value Date   WBC 5.1 02/11/2019   HGB 13.5 02/11/2019   HCT 42.0 02/11/2019   MCV 87 02/11/2019   PLT 175 02/11/2019    Lab Results  Component Value Date   CREATININE 1.07 07/05/2019   BUN 13 07/05/2019   NA 140 07/05/2019   K 4.7 07/05/2019   CL 103 07/05/2019   CO2 23 07/05/2019    Lab Results  Component Value Date   ALT 34 07/05/2019   AST 30 07/05/2019   ALKPHOS 83 07/05/2019   BILITOT 1.0 07/05/2019     Physical Exam: BP 130/82   Pulse 66   Ht 5\' 7"  (1.702 m)   Wt 179 lb (81.2 kg)   BMI 28.04 kg/m  Constitutional: Pleasant,well-developed, male in no acute distress.  Cardiovascular: Normal rate, regular rhythm.  Pulmonary/chest: Effort normal and breath sounds normal.  Lymphadenopathy: No cervical adenopathy noted. Neurological: Alert and oriented to person place and time. Psychiatric: Normal mood and affect. Behavior is normal.   ASSESSMENT AND PLAN: 60 year old male here for reassessment  following:  Colon cancer screening / antiplatelet use - now has completed 1 year of dual antiplatelet therapy and cardiology has cleared him to hold Brilinta for 5 days in order to proceed with a colonoscopy.  His last echocardiogram is as outlined above, okay to do this at the [facility].  Counseled him that it is okay to use baby aspirin 81 mg/day when he hold his Brilinta.  He understands risks of stent thrombosis, MI while off Brilinta, risk would be very low for this however.  Otherwise asymptomatic.  Discussed risk benefits of colonoscopy and anesthesia and he wants to proceed.  Further recommendations pending results.   Bison Cellar, MD [facility]

## 2020-04-04 ENCOUNTER — Ambulatory Visit (AMBULATORY_SURGERY_CENTER): Payer: 59 | Admitting: Gastroenterology

## 2020-04-04 ENCOUNTER — Other Ambulatory Visit: Payer: Self-pay

## 2020-04-04 ENCOUNTER — Encounter: Payer: Self-pay | Admitting: Gastroenterology

## 2020-04-04 VITALS — BP 122/71 | HR 63 | Temp 97.3°F | Resp 17 | Ht 67.0 in | Wt 179.0 lb

## 2020-04-04 DIAGNOSIS — D123 Benign neoplasm of transverse colon: Secondary | ICD-10-CM

## 2020-04-04 DIAGNOSIS — Z1211 Encounter for screening for malignant neoplasm of colon: Secondary | ICD-10-CM | POA: Diagnosis present

## 2020-04-04 MED ORDER — SODIUM CHLORIDE 0.9 % IV SOLN: 500.0000 mL | Freq: Once | INTRAVENOUS | Status: DC

## 2020-04-04 NOTE — Progress Notes (Signed)
pt tolerated well. VSS. awake and to recovery. Report given to RN.  

## 2020-04-04 NOTE — Op Note (Signed)
[facility] Patient Name: Zachary Flynn Procedure Date: 04/04/2020 9:40 AM MRN: [id number] Endoscopist: Viviann Spare P. Adela Lank , MD Age: 60 Referring MD:  Date of Birth: [birth year] Gender: Male Account #: [id number] Procedure:                Colonoscopy Indications:              Screening for colorectal malignant neoplasm Medicines:                Monitored Anesthesia Care Procedure:                Pre-Anesthesia Assessment:                           - Prior to the procedure, a History and Physical                            was performed, and patient medications and                            allergies were reviewed. The patient's tolerance of                            previous anesthesia was also reviewed. The risks                            and benefits of the procedure and the sedation                            options and risks were discussed with the patient.                            All questions were answered, and informed consent                            was obtained. Prior Anticoagulants: The patient has                            taken antiplatelet medication (Brilinta), last dose                            was 5 days prior to procedure. ASA Grade                            Assessment: III - A patient with severe systemic                            disease. After reviewing the risks and benefits,                            the patient was deemed in satisfactory condition to                            undergo the procedure.  After obtaining informed consent, the colonoscope                            was passed under direct vision. Throughout the                            procedure, the patient's blood pressure, pulse, and                            oxygen saturations were monitored continuously. The                            Olympus CF-[id number]) Colonoscope was                            introduced through the anus and  advanced to the the                            cecum, identified by appendiceal orifice and                            ileocecal valve. The colonoscopy was performed                            without difficulty. The patient tolerated the                            procedure well. The quality of the bowel                            preparation was good. The ileocecal valve,                            appendiceal orifice, and rectum were photographed. Scope In: 9:48:30 AM Scope Out: 10:05:25 AM Scope Withdrawal Time: 0 hours 14 minutes 24 seconds  Total Procedure Duration: 0 hours 16 minutes 55 seconds  Findings:                 The perianal and digital rectal examinations were                            normal.                           A 3 mm polyp was found in the transverse colon. The                            polyp was sessile. The polyp was removed with a                            cold snare. Resection and retrieval were complete.                           A few small-mouthed diverticula were found in the  sigmoid colon.                           Internal hemorrhoids were found during                            retroflexion. The hemorrhoids were small.                           The exam was otherwise without abnormality. Complications:            No immediate complications. Estimated blood loss:                            Minimal. Estimated Blood Loss:     Estimated blood loss was minimal. Impression:               - One 3 mm polyp in the transverse colon, removed                            with a cold snare. Resected and retrieved.                           - Diverticulosis in the sigmoid colon.                           - Internal hemorrhoids.                           - The examination was otherwise normal. Recommendation:           - Patient has a contact number available for                            emergencies. The signs and symptoms of potential                             delayed complications were discussed with the                            patient. Return to normal activities tomorrow.                            Written discharge instructions were provided to the                            patient.                           - Resume previous diet.                           - Continue present medications.                           - Resume Brilinta tomorrow                           -  Await pathology results. Viviann Spare P. Lynnita Somma, MD 04/04/2020 10:10:14 AM This report has been signed electronically.

## 2020-04-04 NOTE — Patient Instructions (Signed)
Resume Brillinta tomorrow. Handout on polyps, diverticulosis given.   YOU HAD AN ENDOSCOPIC PROCEDURE TODAY AT [facility]:   Refer to the procedure report that was given to you for any specific questions about what was found during the examination.  If the procedure report does not answer your questions, please call your gastroenterologist to clarify.  If you requested that your care partner not be given the details of your procedure findings, then the procedure report has been included in a sealed envelope for you to review at your convenience later.  YOU SHOULD EXPECT: Some feelings of bloating in the abdomen. Passage of more gas than usual.  Walking can help get rid of the air that was put into your GI tract during the procedure and reduce the bloating. If you had a lower endoscopy (such as a colonoscopy or flexible sigmoidoscopy) you may notice spotting of blood in your stool or on the toilet paper. If you underwent a bowel prep for your procedure, you may not have a normal bowel movement for a few days.  Please Note:  You might notice some irritation and congestion in your nose or some drainage.  This is from the oxygen used during your procedure.  There is no need for concern and it should clear up in a day or so.  SYMPTOMS TO REPORT IMMEDIATELY:   Following lower endoscopy (colonoscopy or flexible sigmoidoscopy):  Excessive amounts of blood in the stool  Significant tenderness or worsening of abdominal pains  Swelling of the abdomen that is new, acute  Fever of 100F or higher   For urgent or emergent issues, a gastroenterologist can be reached at any hour by calling [phone]. Do not use MyChart messaging for urgent concerns.    DIET:  We do recommend a small meal at first, but then you may proceed to your regular diet.  Drink plenty of fluids but you should avoid alcoholic beverages for 24 hours.  ACTIVITY:  You should plan to take it easy for the rest of  today and you should NOT DRIVE or use heavy machinery until tomorrow (because of the sedation medicines used during the test).    FOLLOW UP: Our staff will call the number listed on your records 48-72 hours following your procedure to check on you and address any questions or concerns that you may have regarding the information given to you following your procedure. If we do not reach you, we will leave a message.  We will attempt to reach you two times.  During this call, we will ask if you have developed any symptoms of COVID 19. If you develop any symptoms (ie: fever, flu-like symptoms, shortness of breath, cough etc.) before then, please call [phone].  If you test positive for Covid 19 in the 2 weeks post procedure, please call and report this information to [place].    If any biopsies were taken you will be contacted by phone or by letter within the next 1-3 weeks.  Please call us at [phone] if you have not heard about the biopsies in 3 weeks.    SIGNATURES/CONFIDENTIALITY: You and/or your care partner have signed paperwork which will be entered into your electronic medical record.  These signatures attest to the fact that that the information above on your After Visit Summary has been reviewed and is understood.  Full responsibility of the confidentiality of this discharge information lies with you and/or your care-partner.

## 2020-04-06 ENCOUNTER — Telehealth: Payer: Self-pay

## 2020-04-06 ENCOUNTER — Telehealth: Payer: Self-pay | Admitting: *Deleted

## 2020-04-06 NOTE — Telephone Encounter (Signed)
  Follow up Call-  Call back number 04/04/2020  Post procedure Call Back phone  # [phone]  Permission to leave phone message Yes  Some recent data might be hidden     Patient questions:  Message left to call us if necessary.

## 2020-04-06 NOTE — Telephone Encounter (Signed)
  Follow up Call-  Call back number 04/04/2020  Post procedure Call Back phone  # [phone]  Permission to leave phone message Yes  Some recent data might be hidden     Patient questions:  Do you have a fever, pain , or abdominal swelling? No. Pain Score  0 *  Have you tolerated food without any problems? Yes.    Have you been able to return to your normal activities? Yes.    Do you have any questions about your discharge instructions: Diet   No. Medications  No. Follow up visit  No.  Do you have questions or concerns about your Care? No.  Actions: * If pain score is 4 or above: No action needed, pain <4. 1. .Have you developed a fever since your procedure? no  2.   Have you had an respiratory symptoms (SOB or cough) since your procedure? no  3.   Have you tested positive for COVID 19 since your procedure no  4.   Have you had any family members/close contacts diagnosed with the COVID 19 since your procedure?  no   If yes to any of these questions please route to Joylene John, RN and Joella Prince, RN

## 2020-06-28 LAB — COMPREHENSIVE METABOLIC PANEL
ALT: 26 IU/L (ref 0–44)
AST: 19 IU/L (ref 0–40)
Albumin/Globulin Ratio: 2 (ref 1.2–2.2)
Albumin: 4.4 g/dL (ref 3.8–4.8)
Alkaline Phosphatase: 73 IU/L (ref 44–121)
BUN/Creatinine Ratio: 10 (ref 10–24)
BUN: 11 mg/dL (ref 8–27)
Bilirubin Total: 1.2 mg/dL (ref 0.0–1.2)
CO2: 24 mmol/L (ref 20–29)
Calcium: 9.6 mg/dL (ref 8.6–10.2)
Chloride: 105 mmol/L (ref 96–106)
Creatinine, Ser: 1.1 mg/dL (ref 0.76–1.27)
Globulin, Total: 2.2 g/dL (ref 1.5–4.5)
Glucose: 144 mg/dL — ABNORMAL HIGH (ref 65–99)
Potassium: 5 mmol/L (ref 3.5–5.2)
Sodium: 141 mmol/L (ref 134–144)
Total Protein: 6.6 g/dL (ref 6.0–8.5)
eGFR: 76 mL/min/{1.73_m2} (ref >59)

## 2020-06-28 LAB — LIPID PANEL
Chol/HDL Ratio: 3.1 ratio (ref 0.0–5.0)
Cholesterol, Total: 103 mg/dL (ref 100–199)
HDL: 33 mg/dL — ABNORMAL LOW (ref >39)
LDL Chol Calc (NIH): 57 mg/dL (ref 0–99)
Triglycerides: 54 mg/dL (ref 0–149)
VLDL Cholesterol Cal: 13 mg/dL (ref 5–40)

## 2020-06-29 ENCOUNTER — Ambulatory Visit: Payer: 59 | Admitting: Cardiology

## 2020-06-29 ENCOUNTER — Encounter: Payer: Self-pay | Admitting: Cardiology

## 2020-06-29 ENCOUNTER — Other Ambulatory Visit: Payer: Self-pay

## 2020-06-29 VITALS — BP 162/82 | HR 63 | Ht 67.0 in | Wt 187.2 lb

## 2020-06-29 DIAGNOSIS — E785 Hyperlipidemia, unspecified: Secondary | ICD-10-CM

## 2020-06-29 DIAGNOSIS — Z955 Presence of coronary angioplasty implant and graft: Secondary | ICD-10-CM

## 2020-06-29 DIAGNOSIS — I77810 Thoracic aortic ectasia: Secondary | ICD-10-CM

## 2020-06-29 DIAGNOSIS — I2102 ST elevation (STEMI) myocardial infarction involving left anterior descending coronary artery: Secondary | ICD-10-CM

## 2020-06-29 DIAGNOSIS — I25110 Atherosclerotic heart disease of native coronary artery with unstable angina pectoris: Secondary | ICD-10-CM

## 2020-06-29 DIAGNOSIS — I10 Essential (primary) hypertension: Secondary | ICD-10-CM

## 2020-06-29 DIAGNOSIS — R7303 Prediabetes: Secondary | ICD-10-CM

## 2020-06-29 LAB — HEMOGLOBIN A1C
Est. average glucose Bld gHb Est-mCnc: 140 mg/dL
Hgb A1c MFr Bld: 6.5 % — ABNORMAL HIGH (ref 4.8–5.6)

## 2020-06-29 MED ORDER — NITROGLYCERIN 0.4 MG SL SUBL: 0.4000 mg | SUBLINGUAL_TABLET | SUBLINGUAL | 6 refills | Status: DC | PRN

## 2020-06-29 NOTE — Progress Notes (Signed)
Primary Care Provider: Lawerance Cruel, MD Cardiologist: [name], MD Electrophysiologist: None  Clinic Note: Chief Complaint  Patient presents with  . Follow-up    6 months.  Doing well.  . Hypertension    Always notes elevated blood pressures when he comes into the doctor's office.  Usually better at home.  . Coronary Artery Disease    No further angina or CHF symptoms.   ===================================  ASSESSMENT/PLAN   Problem List Items Addressed This Visit    Pre-diabetes    A1c is above 6.5.  Not currently on any medications.  He is adjusting his diet.  Low threshold to consider being more aggressive if numbers do not improve FFR as first option but SGLT2 inhibitor would be reasonable second option.      STEMI involving left anterior descending coronary artery (HCC) (Chronic)    Over a year and a half out from his anterior MI.  Possible LAD occlusion.  Notable improvement in EF from relatively stable EF of probably about 45%.  Initial echo estimated 50 to 55% was probably resting.  I think 45 to 50% is probably more likely.  Is not having any CHF or anginal type symptoms.  Now trying to work on weight loss after putting on some weight over the last several months. On stable regimen.      Relevant Medications   nitroGLYCERIN (NITROSTAT) 0.4 MG SL tablet   rosuvastatin (CRESTOR) 20 MG tablet   Other Relevant Orders   EKG 12-Lead (Completed)   Coronary artery disease involving native coronary artery of native heart with unstable angina pectoris ([place]) - Primary (Chronic)    Sizable anterior infarct with proximal LAD occlusion.  DES PCI reduced to 0%.  Is also 50% ramus intermedius disease.  Echo shows mild anterior hypokinesis consistent with prior anterior infarct. No active anginal symptoms, and he is very active.  Plan: Continue maintenance dose ticagrelor 60 mg twice daily along with stable dose of rosuvastatin, carvedilol and valsartan.  Refill  sublingual nitroglycerin  Additional blood pressure control as indicated pending his blood pressure log.      Relevant Medications   nitroGLYCERIN (NITROSTAT) 0.4 MG SL tablet   rosuvastatin (CRESTOR) 20 MG tablet   Other Relevant Orders   EKG 12-Lead (Completed)   Presence of drug coated stent in LAD coronary artery (Chronic)    3 0.0 mm x 22 mm DES stent to the proximal LAD with jailed diagonal.  He has completed his 1 year of uninterrupted DAPT, but would plan for long-term Thienopyridine antiplatelet agent.  He is tolerating ticagrelor/Brilinta maintenance dose without any issues.  For now, continue for maintenance, but okay to interrupt.   Okay to hold Brilinta 5 days preop for surgery.-Restart when safe postop.  If okay with surgeon, would temporarily substitute aspirin 81 mg for Brilinta during this time.        Relevant Orders   EKG 12-Lead (Completed)   Essential hypertension (Chronic)    Again his blood pressure is high in the clinic today.  He tells me it is much better than this at home, and at work.  Have asked that he maintain a blood pressure log.  He is due to see his PCP in about a month.  He can [facility] a BP [facility].  With resting heart rate of 63, probably would not really titrate carvedilol, and he is on max dose of Diovan.   Per guidelines, next agent  should be a thiazide diuretic such as chlorthalidone or HCTZ or spironolactone since he does not necessarily require loop diuretic.  Data would support use of chlorthalidone or spironolactone however HCTZ.      Relevant Medications   nitroGLYCERIN (NITROSTAT) 0.4 MG SL tablet   rosuvastatin (CRESTOR) 20 MG tablet   Hyperlipidemia with target LDL less than 70 (Chronic)    Labs look great.  LDL 57.  Total cholesterol 103.  HDL is little low, with total partial being so low and I would expect it to be increased. Continue current dose of statin-rosuvastatin 20 mg daily.      Relevant  Medications   nitroGLYCERIN (NITROSTAT) 0.4 MG SL tablet   rosuvastatin (CRESTOR) 20 MG tablet   Mild dilation of ascending aorta (HCC) (Chronic)    We can order a follow-up CT angiogram of the chest at his next visit.      Relevant Medications   nitroGLYCERIN (NITROSTAT) 0.4 MG SL tablet   rosuvastatin (CRESTOR) 20 MG tablet      ===================================  HPI:    Zachary Flynn is a 60 y.o. male with a PMH notable for anterior STEMI in September 2020 (mildly reduced EF by echo) who presents today for 67-month follow-up.  BRIANNE MUHS was last seen on December 24, 2019 doing pretty well.  Some cold intolerance.  Not been as vigorous with his exercise-only doing 8000-12,000 steps a day.  Asymptomatic from cardiac standpoint.  No significant orthostatic hypotension.  Occasional tachycardia spells when for standing up.  Blood pressure usually 130/80 range.  Increased dose of valsartan.  Recent Hospitalizations: None  Reviewed  CV studies:    The following studies were reviewed today: (if available, images/films reviewed: From Epic Chart or Care Everywhere) . None:   Interval History:   [name] returns here today overall doing pretty well.  He is little frustrated because of his recent checkup showing elevated A1c.  He is trying to adjust his diet.  He is also still trying to get his close to 10,000 steps a day, but has not really been doing as much as he should he indicates that his symptoms injury to his left arm where he now has some tingling and numbness in his hand from his elbow down. He tells me that his home systolic blood pressures usually run in the 130s, and that he always has this issue with whitecoat syndrome.  Denies any anxiety or rapid regular heart palpitations.  He actually says he gets a little bit dizzy when he first stands up (mostly in the morning or if he has been sitting for long period time..  CV Review of Symptoms (Summary): no chest pain  or dyspnea on exertion positive for - Occasional mild dizziness when he first gets up.  Otherwise if he sits still for a few minutes he will be fine. negative for - edema, irregular heartbeat, orthopnea, palpitations, paroxysmal nocturnal dyspnea, rapid heart rate, shortness of breath or Lightheadedness, dizziness, wooziness, syncope/near syncope or TIA/amaurosis fugax.  The patient does not have symptoms concerning for COVID-19 infection (fever, chills, cough, or new shortness of breath).   REVIEWED OF SYSTEMS   Review of Systems  Constitutional: Negative for malaise/fatigue and weight loss (He is actually gained weight because of not been eating well.  Now adjusting his diet.).  HENT: Negative for congestion and nosebleeds.   Respiratory: Negative for cough and shortness of breath.   Cardiovascular: Negative for leg swelling.  Gastrointestinal: Negative for  abdominal pain, blood in stool and melena.  Genitourinary: Negative for hematuria.  Musculoskeletal: Positive for back pain (still has radicular pain down the left leg) and joint pain (Left elbow and wrist).  Neurological: Positive for dizziness (Only when he first stands up), tingling (Tingling and numbness of the left hand with decreased grip) and weakness (Left hand).  Endo/Heme/Allergies: Positive for environmental allergies. Does not bruise/bleed easily.  Psychiatric/Behavioral: Negative for memory loss. The patient is not nervous/anxious and does not have insomnia.    I have reviewed and (if needed) personally updated the patient's problem list, medications, allergies, past medical and surgical history, social and family history.   PAST MEDICAL HISTORY   Past Medical History:  Diagnosis Date  . CAD (coronary artery disease) 12/02/2018   a. 100% proximal LAD insetting of anterior STEMI--PCI: Resolute DES 3.0 mm x 22 mm (postdilated 3.5-3.1 mm).  Also 50% proximal RI.  Left dominant system.  Normal EF by LV gram.  . Cardiogenic  shock ([place]) 12/2018   Mild shock complicating anterior STEMI; did not require mechanical support  . Cervical disc disease   . Chronic diastolic CHF (congestive heart failure), NYHA class 1 ([place]) 11/2018   a. LVEDP elevated at time of MI ->; GRII DD on follow-up echo  . Erectile dysfunction   . Essential hypertension 12/02/2018  . Hyperlipidemia with target LDL less than 70 12/02/2018  . Mild dilation of ascending aorta ([place])    a. by echo 12/2018.  . Pre-diabetes     PAST SURGICAL HISTORY   Past Surgical History:  Procedure Laterality Date  . CERVICAL SPINE SURGERY  2012   Dr. Ellene Route  . CORONARY/GRAFT ACUTE MI REVASCULARIZATION N/A 12/02/2018   Procedure: Coronary/Graft Acute MI Revascularization-CORONARY STENT INTERVENTION;  Surgeon: [name], MD;  Location: [facility];  Service: Cardiovascular;; p-mLAD 100% w/ 50% D1 ostial dz ( PCI - Resolute Onyx DES 3.0 mm x 32 mm --postdilated 3.5-3.1 mm).  . LEFT HEART CATH AND CORONARY ANGIOGRAPHY N/A 12/02/2018   Procedure: LEFT HEART CATH AND CORONARY ANGIOGRAPHY;  Surgeon: [name], MD;  Location: [facility]; INDICATION-ANTERIOR STEMI: p-mLAD 100% w/ 50% D1 ostial dz (DES PCI).  Moderate RI 55 %.  EF 55 to 60% (preliminary LV gram showed anterior hypokinesis, resolved following PCI).  Severely elevated LVEDP of 29 mmHg (acute diasto  . REVISION TOTAL HIP ARTHROPLASTY Left 2003  . TOTAL HIP ARTHROPLASTY Left 2000   Dr. Durward Fortes  . TRANSTHORACIC ECHOCARDIOGRAM  12/02/2018   (In setting of anterior STEMI) EF 50-55%.  Apical septal and apical anterior akinesis.  Moderate LVH.  GR 1 DD.  Normal LA and RA size.  No valvular disease.  Mildly dilated ascending aorta of 39 mm.  . TRANSTHORACIC ECHOCARDIOGRAM  11/24/2019   EF 45 to 50%.  Mildly decreased function.  Hypokinesis of the mid-apical anteroseptal and apical wall.  GRII DD.  (Pseudonormalization).  Normal RV size and function.  Mildly dilated ascending aorta 43 mm.   Normal CVP.     Cardiac Cath-PCI 12/02/2018: p-mLAD 100% w/ 50% D1 ostial dz ( PCI - Resolute Onyx DES 3.0 mm x 32 mm --postdilated 3.5-3.1 mm). Moderate RI 55 %. EF 55 to 60% (preliminary LV gram showed anterior hypokinesis, resolved following PCI). Severely elevated LVEDP of 29 mmHg (acute diastolic heart failure). `  Immunization History  Administered Date(s) Administered  . PFIZER(Purple Top)SARS-COV-2 Vaccination 05/08/2019, 05/29/2019  . Tdap 01/02/2012  . Zoster Recombinat (Shingrix) 07/15/2019  MEDICATIONS/ALLERGIES   Current Meds  Medication Sig  . carvedilol (COREG) 6.25 MG tablet TAKE ONE TABLET BY MOUTH TWICE A DAY WITH A MEAL  . ketoconazole (NIZORAL) 2 % cream SMARTSIG:1 Application Topical 1 to 2 Times Daily  . rosuvastatin (CRESTOR) 20 MG tablet 1 tablet  . sildenafil (REVATIO) 20 MG tablet Take 20 mg by mouth daily as needed for erectile dysfunction.  . ticagrelor (BRILINTA) 60 MG TABS tablet Take 1 tablet (60 mg total) by mouth 2 (two) times daily.  . valsartan (DIOVAN) 320 MG tablet Take 1 tablet (320 mg total) by mouth daily.  . [DISCONTINUED] BRILINTA 90 MG TABS tablet TAKE ONE TABLET BY MOUTH TWICE A DAY (Patient taking differently: 60 mg. Patient takes twice daily)  . [DISCONTINUED] nitroGLYCERIN (NITROSTAT) 0.4 MG SL tablet Place 1 tablet (0.4 mg total) under the tongue every 5 (five) minutes as needed for chest pain.    Allergies  Allergen Reactions  . Food     Lactose intolerant, no dairy products  . Lisinopril Cough  . Pollen Extract     SOCIAL HISTORY/FAMILY HISTORY   Reviewed in Epic:  Pertinent findings:  Social History   Tobacco Use  . Smoking status: Never Smoker  . Smokeless tobacco: Never Used  Vaping Use  . Vaping Use: Never used  Substance Use Topics  . Alcohol use: Never  . Drug use: Never   Social History   Social History Narrative  . Not on file    OBJCTIVE -PE, EKG, labs   Wt Readings from Last 3 Encounters:   06/29/20 187 lb 3.2 oz (84.9 kg)  04/04/20 179 lb (81.2 kg)  03/13/20 179 lb (81.2 kg)    Physical Exam: BP (!) 162/82   Pulse 63   Ht 5\' 7"  (1.702 m)   Wt 187 lb 3.2 oz (84.9 kg)   SpO2 99%   BMI 29.32 kg/m  Physical Exam Constitutional:      General: He is not in acute distress.    Appearance: Normal appearance. He is not ill-appearing or toxic-appearing.     Comments: Well-groomed.   healthy-appearing.;  Borderline obese  HENT:     Head: Normocephalic and atraumatic.  Neck:     Vascular: No carotid bruit or JVD.  Cardiovascular:     Rate and Rhythm: Normal rate and regular rhythm.  No extrasystoles are present.    Chest Wall: PMI is not displaced.     Pulses: Normal pulses.     Heart sounds: No murmur heard. No friction rub. Gallop present. S4 (Versus split S2.  Difficult to assess) sounds present.   Pulmonary:     Effort: Pulmonary effort is normal. No respiratory distress.     Breath sounds: Normal breath sounds.  Chest:     Chest wall: No tenderness.  Abdominal:     General: Bowel sounds are normal.     Palpations: Abdomen is soft.     Tenderness: There is no abdominal tenderness.     Comments: No HSM  Musculoskeletal:        General: No swelling. Normal range of motion.     Cervical back: Normal range of motion and neck supple.  Skin:    General: Skin is warm and dry.  Neurological:     General: No focal deficit present.     Mental Status: He is alert and oriented to person, place, and time.  Psychiatric:        Mood and Affect: Mood normal.  Behavior: Behavior normal.        Thought Content: Thought content normal.        Judgment: Judgment normal.     Adult ECG Report  Rate: 63 ;  Rhythm: normal sinus rhythm and Left axis deviation (-59 ); LVH.  Septal infarct, age undetermined.  There is ST-T wave changes consistent with anterolateral ischemia-T wave inversions.  Otherwise normal axis, intervals, and durations;   Narrative Interpretation:  Stable EKG findings consistent with prior anterior infarct.  Copy of EKG provided to patient.  Recent Labs: Reviewed-labs checked yesterday Lab Results  Component Value Date   CHOL 103 06/28/2020   HDL 33 (L) 06/28/2020   LDLCALC 57 06/28/2020   TRIG 54 06/28/2020   CHOLHDL 3.1 06/28/2020   Lab Results  Component Value Date   CREATININE 1.10 06/28/2020   BUN 11 06/28/2020   NA 141 06/28/2020   K 5.0 06/28/2020   CL 105 06/28/2020   CO2 24 06/28/2020   CBC Latest Ref Rng & Units 02/11/2019 12/02/2018 12/02/2018  WBC 3.4 - 10.8 x10E3/uL 5.1 13.6(H) -  Hemoglobin 13.0 - 17.7 g/dL 13.5 14.7 13.3  Hematocrit 37.5 - 51.0 % 42.0 46.3 39.0  Platelets 150 - 450 x10E3/uL 175 235 -   Lab Results  Component Value Date   HGBA1C 6.5 (H) 06/28/2020    Lab Results  Component Value Date   TSH 1.404 12/02/2018    ==================================================  COVID-19 Education: The signs and symptoms of COVID-19 were discussed with the patient and how to seek care for testing (follow up with PCP or arrange E-visit).   The importance of social distancing and COVID-19 vaccination was discussed today. The patient is practicing social distancing & Masking.   I spent a total of 19 minutes with the patient spent in direct patient consultation.  Additional time spent with chart review  / charting (studies, outside notes, etc): 14 min Total Time: 33 min   Current medicines are reviewed at length with the patient today.  (+/- concerns) N/A  This visit occurred during the SARS-CoV-2 public health emergency.  Safety protocols were in place, including screening questions prior to the visit, additional usage of staff PPE, and extensive cleaning of exam room while observing appropriate contact time as indicated for disinfecting solutions.  Notice: This dictation was prepared with Dragon dictation along with smaller phrase technology. Any transcriptional errors that result from this process  are unintentional and may not be corrected upon review.  Patient Instructions / Medication Changes & Studies & Tests Ordered   Patient Instructions  Medication Instructions:  No changes  *If you need a refill on your cardiac medications before your next appointment, please call your pharmacy*   Lab Work:  Not needed   Testing/Procedures:  Not needed  Follow-Up: At [facility], you and your health needs are our priority.  As part of our continuing mission to provide you with exceptional heart care, we have created designated Provider Care Teams.  These Care Teams include your primary Cardiologist (physician) and Advanced Practice Providers (APPs -  Physician Assistants and Nurse Practitioners) who all work together to provide you with the care you need, when you need it.     Your next appointment:   6 month(s)  The format for your next appointment:   In Person  Provider:   [name], MD   Other Instructions  Please have a blood pressure log - check blood pressure about 4 to 5 times a week-take  Reading to your primary at your next visit   Looking at the trend of your blood pressures.    Studies Ordered:   Orders Placed This Encounter  Procedures  . EKG 12-Lead     [name], M.D., M.S. Interventional Cardiologist   Pager # [phone] Phone # [phone] [address] [place], [place] [address]   Thank you for choosing Heart[facility] at [facility]!!

## 2020-06-29 NOTE — Patient Instructions (Signed)
Medication Instructions:  No changes  *If you need a refill on your cardiac medications before your next appointment, please call your pharmacy*   Lab Work:  Not needed   Testing/Procedures:  Not needed  Follow-Up: At [facility], you and your health needs are our priority.  As part of our continuing mission to provide you with exceptional heart care, we have created designated Provider Care Teams.  These Care Teams include your primary Cardiologist (physician) and Advanced Practice Providers (APPs -  Physician Assistants and Nurse Practitioners) who all work together to provide you with the care you need, when you need it.     Your next appointment:   6 month(s)  The format for your next appointment:   In Person  Provider:   [name], MD   Other Instructions  Please have a blood pressure log - check blood pressure about 4 to 5 times a week-take  Reading to your primary at your next visit   Looking at the trend of your blood pressures.

## 2020-07-08 ENCOUNTER — Encounter: Payer: Self-pay | Admitting: Cardiology

## 2020-07-08 NOTE — Assessment & Plan Note (Signed)
Over a year and a half out from his anterior MI.  Possible LAD occlusion.  Notable improvement in EF from relatively stable EF of probably about 45%.  Initial echo estimated 50 to 55% was probably resting.  I think 45 to 50% is probably more likely.  Is not having any CHF or anginal type symptoms.  Now trying to work on weight loss after putting on some weight over the last several months. On stable regimen.

## 2020-07-08 NOTE — Assessment & Plan Note (Signed)
Again his blood pressure is high in the clinic today.  He tells me it is much better than this at home, and at work.  Have asked that he maintain a blood pressure log.  He is due to see his PCP in about a month.  He can [facility] a [facility].  With resting heart rate of 63, probably would not really titrate carvedilol, and he is on max dose of Diovan.   Per guidelines, next agent should be a thiazide diuretic such as chlorthalidone or HCTZ or spironolactone since he does not necessarily require loop diuretic.  Data would support use of chlorthalidone or spironolactone however HCTZ.

## 2020-07-08 NOTE — Assessment & Plan Note (Addendum)
Sizable anterior infarct with proximal LAD occlusion.  DES PCI reduced to 0%.  Is also 50% ramus intermedius disease.  Echo shows mild anterior hypokinesis consistent with prior anterior infarct. No active anginal symptoms, and he is very active.  Plan: Continue maintenance dose ticagrelor 60 mg twice daily along with stable dose of rosuvastatin, carvedilol and valsartan.  Refill sublingual nitroglycerin  Additional blood pressure control as indicated pending his blood pressure log.

## 2020-07-08 NOTE — Assessment & Plan Note (Signed)
3 0.0 mm x 22 mm DES stent to the proximal LAD with jailed diagonal.  He has completed his 1 year of uninterrupted DAPT, but would plan for long-term Thienopyridine antiplatelet agent.  He is tolerating ticagrelor/Brilinta maintenance dose without any issues.  For now, continue for maintenance, but okay to interrupt.   Okay to hold Brilinta 5 days preop for surgery.-Restart when safe postop.  If okay with surgeon, would temporarily substitute aspirin 81 mg for Brilinta during this time.

## 2020-07-08 NOTE — Assessment & Plan Note (Addendum)
We can order a follow-up CT angiogram of the chest at his next visit.

## 2020-07-08 NOTE — Assessment & Plan Note (Signed)
A1c is above 6.5.  Not currently on any medications.  He is adjusting his diet.  Low threshold to consider being more aggressive if numbers do not improve FFR as first option but SGLT2 inhibitor would be reasonable second option.

## 2020-07-08 NOTE — Assessment & Plan Note (Signed)
Labs look great.  LDL 57.  Total cholesterol 103.  HDL is little low, with total partial being so low and I would expect it to be increased. Continue current dose of statin-rosuvastatin 20 mg daily.

## 2020-07-20 ENCOUNTER — Other Ambulatory Visit: Payer: Self-pay | Admitting: Cardiology

## 2020-07-26 ENCOUNTER — Other Ambulatory Visit: Payer: Self-pay | Admitting: Cardiology

## 2020-10-06 ENCOUNTER — Other Ambulatory Visit: Payer: Self-pay

## 2020-10-06 MED ORDER — CARVEDILOL 6.25 MG PO TABS: ORAL_TABLET | ORAL | 3 refills | Status: DC

## 2020-12-17 ENCOUNTER — Other Ambulatory Visit: Payer: Self-pay | Admitting: Cardiology

## 2021-01-08 ENCOUNTER — Ambulatory Visit: Payer: 59 | Admitting: Cardiology

## 2021-01-15 ENCOUNTER — Encounter: Payer: Self-pay | Admitting: Cardiology

## 2021-01-15 ENCOUNTER — Ambulatory Visit: Payer: 59 | Admitting: Cardiology

## 2021-01-15 ENCOUNTER — Other Ambulatory Visit: Payer: Self-pay

## 2021-01-15 VITALS — BP 138/86 | HR 65 | Ht 67.0 in | Wt 183.6 lb

## 2021-01-15 DIAGNOSIS — R7303 Prediabetes: Secondary | ICD-10-CM

## 2021-01-15 DIAGNOSIS — E785 Hyperlipidemia, unspecified: Secondary | ICD-10-CM | POA: Diagnosis not present

## 2021-01-15 DIAGNOSIS — I25110 Atherosclerotic heart disease of native coronary artery with unstable angina pectoris: Secondary | ICD-10-CM | POA: Diagnosis not present

## 2021-01-15 DIAGNOSIS — I2102 ST elevation (STEMI) myocardial infarction involving left anterior descending coronary artery: Secondary | ICD-10-CM | POA: Diagnosis not present

## 2021-01-15 DIAGNOSIS — I7121 Aneurysm of the ascending aorta, without rupture: Secondary | ICD-10-CM

## 2021-01-15 DIAGNOSIS — I77810 Thoracic aortic ectasia: Secondary | ICD-10-CM

## 2021-01-15 DIAGNOSIS — Z955 Presence of coronary angioplasty implant and graft: Secondary | ICD-10-CM

## 2021-01-15 DIAGNOSIS — I10 Essential (primary) hypertension: Secondary | ICD-10-CM | POA: Diagnosis not present

## 2021-01-15 NOTE — Progress Notes (Signed)
Primary Care Provider: Lawerance Cruel, MD Cardiologist: Zachary Hew, MD Electrophysiologist: None  Clinic Note: Chief Complaint  Patient presents with   Follow-up    6 months per patient preference   Coronary Artery Disease    No further anginal symptoms.  Energy level improved with increased exercise.    ===================================  ASSESSMENT/PLAN   Problem List Items Addressed This Visit       Cardiology Problems   STEMI involving left anterior descending coronary artery (HCC) (Chronic)   Relevant Orders   EKG 12-Lead (Completed)   Lipid panel (Completed)   Comprehensive metabolic panel (Completed)   Hemoglobin A1c (Completed)   Coronary artery disease involving native coronary artery of native heart with unstable angina pectoris ([place]) - Primary (Chronic)    He had a pretty significant anterior infarct with mildly reduced EF on Echo in follow-up after his MI.  Has residual 50 to 60% ramus intermedius lesion as well as ostial diagonal lesion 305, symptomatic standpoint.  Plan:  Continue maintenance dose Brilinta as monotherapy. Continue ARB and beta-blocker-May need to titrate further. Continue current dose of statin      Relevant Orders   EKG 12-Lead (Completed)   Lipid panel (Completed)   Comprehensive metabolic panel (Completed)   Hemoglobin A1c (Completed)   Essential hypertension (Chronic)    He says his blood pressure is better than it then measured here.  Continue to monitor at home.  At home he says his systolic pressures are more 120-130 mmHg.  Continue to monitor pressures.  He is on max dose of losartan, but we could potentially increase carvedilol.  In the past, I have ordered titrating the beta-blocker further because of borderline recommended cardiac.  He may be a tolerate slightly higher dose versus adding a thiazide diuretic.      Relevant Orders   EKG 12-Lead (Completed)   Lipid panel (Completed)   Comprehensive metabolic panel  (Completed)   Hyperlipidemia with target LDL less than 70 (Chronic)    Due for recheck of labs, we can do a check lipids when he comes back indicate his recently labs done.  We will also check A1c.  Most recent lipids showed LDL of 57 which is pretty much at goal.  He is doing fine on the 20 mg rosuvastatin.  Hopefully with his continued exercise, we can continue on the lower dose.      Relevant Orders   Lipid panel (Completed)   Comprehensive metabolic panel (Completed)   Hemoglobin A1c (Completed)   Mild dilation of ascending aorta (HCC) (Chronic)    Not really aneurysmal dilation of the aorta.  He did have slight increase in the aortic size on recent echocardiogram.  Would want to make sure that he does not have progressive enlargement of the aortic root.  Plan: CT Angio Chest Aorta -> He will come in to get his chemistry panel checked we can go and treatment with his well.      Relevant Orders   CT ANGIO CHEST AORTA W/CM & OR WO/CM   Lipid panel (Completed)     Other   Presence of drug coated stent in LAD coronary artery (Chronic)    Sizable LAD stent jailing the diagonal branch.  Because of the location of his lesion, I have chosen to continue on maintenance Brilinta 60 mg twice daily.  Okay to hold Brilinta up to 7 days preop surgeries or procedures.  Restart when safe..      Relevant Orders   EKG  12-Lead (Completed)   Lipid panel (Completed)   Comprehensive metabolic panel (Completed)   Hemoglobin A1c (Completed)   Pre-diabetes    A1c was 6.5 in April, will recheck to make sure he is not going the wrong direction.      Relevant Orders   Lipid panel (Completed)   Comprehensive metabolic panel (Completed)   Hemoglobin A1c (Completed)   Other Visit Diagnoses     Aneurysm of ascending aorta without rupture       Relevant Orders   CT ANGIO CHEST AORTA W/CM & OR WO/CM   Lipid panel (Completed)      ===================================  HPI:    Zachary Flynn is a 60 y.o. male with a PMH notable for CAD (anterior STEMI September 2020), HTN, HLD and PreDM2, and Mild Thoracic Aortic Dilation.  Who presents today for 32-month follow-up.  12/02/2018: Anterior STEMI-proximal mid LAD 100% with 50% D1-resolute Onyx DES 3.0 mm a 22 mm (postdilated to 3.5-3.1 mm.  Jailed diagonal 50%.  EF 55 to 65%.  Severely elevated LVEDP.  Zachary Flynn was last seen on June 21, 2020: -> Doing pretty well but was frustrated because his recent A1c was elevated.  He is working on adjusting his diet and increasing his level exercise.  Was doing thousand steps a day at that time.  He is also recovering from an injury to his left arm.  Noted blood pressures at home ranged 130s.  Orthostatic dizziness when first getting up. Encouraged intermittent blood pressure monitoring.  No changes.  Recent Hospitalizations: None  Reviewed  CV studies:    The following studies were reviewed today: (if available, images/films reviewed: From Epic Chart or Care Everywhere) None:   Interval History:   Zachary Flynn returns for routine follow-up doing fairly well.  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  He then switched from watching his steps to actually walking more aggressively with a couple of his coworkers.  He actually wears walking/running shoes on occasion and go out during breaks for walks.  He says it seems to be noticing much better energy levels after starting back with more active exercise.  Completely asymptomatic from a cardiac standpoint:  CV Review of Symptoms (Summary) Cardiovascular ROS: no chest pain or dyspnea on exertion positive for - -orthostatic dizziness negative for - edema, irregular heartbeat, orthopnea, palpitations, paroxysmal nocturnal dyspnea, rapid heart rate, shortness of breath, or syncope/near syncope or TIA/'amaurosis fugax,  claudication  REVIEWED OF SYSTEMS   Review of Systems  Constitutional:  Negative for malaise/fatigue (He actually notes that his energy level is much better now that he is doing more walking than he had been before.) and weight loss.  HENT:  Negative for congestion and nosebleeds.   Respiratory:  Negative for cough and shortness of breath.   Cardiovascular:        Per HPI  Gastrointestinal:  Negative for blood in stool and melena.  Genitourinary:  Negative for dysuria, frequency and hematuria.  Musculoskeletal:  Negative for back pain and joint pain.  Neurological:  Negative for dizziness (Rare orthostatic dizziness symptoms.Marland Kitchen), focal weakness and weakness.  Psychiatric/Behavioral:  Negative for depression and memory loss. The patient is not nervous/anxious and does not have insomnia.     I have reviewed and (if needed) personally updated the patient's problem list, medications, allergies, past medical and surgical history, social and  family history.   PAST MEDICAL HISTORY   Past Medical History:  Diagnosis Date   CAD (coronary artery disease) 12/02/2018   a. 100% proximal LAD insetting of anterior STEMI--PCI: Resolute DES 3.0 mm x 22 mm (postdilated 3.5-3.1 mm).  Also 50% proximal RI.  Left dominant system.  Normal EF by LV gram.   Cardiogenic shock ([place]) 12/2018   Mild shock complicating anterior STEMI; did not require mechanical support   Cervical disc disease    Chronic diastolic CHF (congestive heart failure), NYHA class 1 ([place]) 11/2018   a. LVEDP elevated at time of MI ->; GRII DD on follow-up echo   Erectile dysfunction    Essential hypertension 12/02/2018   Hyperlipidemia with target LDL less than 70 12/02/2018   Mild dilation of ascending aorta ([place])    a. by echo 12/2018.   Pre-diabetes     PAST SURGICAL HISTORY   Past Surgical History:  Procedure Laterality Date   CERVICAL SPINE SURGERY  2012   Dr. Drema Pry ACUTE MI REVASCULARIZATION N/A 12/02/2018    Procedure: Coronary/Graft Acute MI Revascularization-CORONARY STENT INTERVENTION;  Surgeon: [name], MD;  Location: [place] [facility];  Service: Cardiovascular;; p-mLAD 100% w/ 50% D1 ostial dz ( PCI - Resolute Onyx DES 3.0 mm x 32 mm --postdilated 3.5-3.1 mm).   LEFT HEART CATH AND CORONARY ANGIOGRAPHY N/A 12/02/2018   Procedure: LEFT HEART CATH AND CORONARY ANGIOGRAPHY;  Surgeon: [name], MD;  Location: [place] [facility]; INDICATION-ANTERIOR STEMI: p-mLAD 100% w/ 50% D1 ostial dz (DES PCI).  Moderate RI 55 %.  EF 55 to 60% (preliminary LV gram showed anterior hypokinesis, resolved following PCI).  Severely elevated LVEDP of 29 mmHg (acute diasto   REVISION TOTAL HIP ARTHROPLASTY Left 2003   TOTAL HIP ARTHROPLASTY Left 2000   Dr. Durward Fortes   TRANSTHORACIC ECHOCARDIOGRAM  12/02/2018   (In setting of anterior STEMI) EF 50-55%.  Apical septal and apical anterior akinesis.  Moderate LVH.  GR 1 DD.  Normal LA and RA size.  No valvular disease.  Mildly dilated ascending aorta of 39 mm.   TRANSTHORACIC ECHOCARDIOGRAM  11/24/2019   EF 45 to 50%.  Mildly decreased function.  Hypokinesis of the mid-apical anteroseptal and apical wall.  GRII DD.  (Pseudonormalization).  Normal RV size and function.  Mildly dilated ascending aorta 43 mm.  Normal CVP.      Immunization History  Administered Date(s) Administered   PFIZER(Purple Top)SARS-COV-2 Vaccination 05/08/2019, 05/29/2019   Tdap 01/02/2012   Zoster Recombinat (Shingrix) 07/15/2019    MEDICATIONS/ALLERGIES   Current Meds  Medication Sig   carvedilol (COREG) 6.25 MG tablet TAKE ONE TABLET BY MOUTH TWICE A DAY WITH A MEAL   nitroGLYCERIN (NITROSTAT) 0.4 MG SL tablet Place 1 tablet (0.4 mg total) under the tongue every 5 (five) minutes as needed for chest pain.   rosuvastatin (CRESTOR) 20 MG tablet TAKE ONE TABLET BY MOUTH DAILY * DISCONTINUE PREVIOUS DOSE OF 40 MG   sildenafil (REVATIO) 20 MG tablet Take 20 mg by mouth daily as  needed for erectile dysfunction.   valsartan (DIOVAN) 320 MG tablet TAKE ONE TABLET BY MOUTH DAILY   [DISCONTINUED] ticagrelor (BRILINTA) 60 MG TABS tablet Take 1 tablet (60 mg total) by mouth 2 (two) times daily.    Allergies  Allergen Reactions   Food     Lactose intolerant, no dairy products   Lisinopril Cough   Pollen Extract     SOCIAL HISTORY/FAMILY HISTORY  Reviewed in Epic:  Pertinent findings:  Social History   Tobacco Use   Smoking status: Never   Smokeless tobacco: Never  Vaping Use   Vaping Use: Never used  Substance Use Topics   Alcohol use: Never   Drug use: Never   Social History   Social History Narrative   Not on file    OBJCTIVE -PE, EKG, labs   Wt Readings from Last 3 Encounters:  01/15/21 183 lb 9.6 oz (83.3 kg)  06/29/20 187 lb 3.2 oz (84.9 kg)  04/04/20 179 lb (81.2 kg)    Physical Exam: BP 138/86 (BP Location: Right Arm, Patient Position: Sitting, Cuff Size: Normal)   Pulse 65   Ht 5\' 7"  (1.702 m)   Wt 183 lb 9.6 oz (83.3 kg)   SpO2 99%   BMI 28.76 kg/m  Physical Exam Vitals reviewed.  Constitutional:      General: He is not in acute distress.    Appearance: Normal appearance. He is normal weight. He is not ill-appearing or toxic-appearing.     Comments: Well-nourished and impeccably groomed.  Came in in his uniform, but wearing his walking shoes.  HENT:     Head: Normocephalic and atraumatic.  Neck:     Vascular: No carotid bruit.  Cardiovascular:     Rate and Rhythm: Normal rate and regular rhythm.     Pulses: Normal pulses.     Heart sounds: No murmur heard.   No friction rub. Gallop present.  Pulmonary:     Effort: Pulmonary effort is normal. No respiratory distress.     Breath sounds: Normal breath sounds.  Chest:     Chest wall: No tenderness.  Musculoskeletal:        General: No swelling. Normal range of motion.     Cervical back: Normal range of motion and neck supple.  Skin:    General: Skin is warm and dry.   Neurological:     General: No focal deficit present.     Mental Status: He is alert and oriented to person, place, and time. Mental status is at baseline.     Gait: Gait normal.  Psychiatric:        Mood and Affect: Mood normal.        Behavior: Behavior normal.        Thought Content: Thought content normal.        Judgment: Judgment normal.    Adult ECG Report  Rate: 65;  Rhythm: normal sinus rhythm and left axis deviation, LVH by voltage, cannot rule out septal MI, age-indeterminate.  ST-T wave changes consistent with anterior ischemia (biphasic changes) ;   Narrative Interpretation: Stable  Recent Labs:   06/28/2020: TC 103, TG 54, HDL 33, LDL 57.  A1c 6.5. Cr 1.1, K+ 5.0.  CBC Latest Ref Rng & Units 02/11/2019 12/02/2018 12/02/2018  WBC 3.4 - 10.8 x10E3/uL 5.1 13.6(H) -  Hemoglobin 13.0 - 17.7 g/dL 13.5 14.7 13.3  Hematocrit 37.5 - 51.0 % 42.0 46.3 39.0  Platelets 150 - 450 x10E3/uL 175 235 -   Lab Results  Component Value Date   TSH 1.404 12/02/2018    ==================================================  COVID-19 Education: The signs and symptoms of COVID-19 were discussed with the patient and how to seek care for testing (follow up with PCP or arrange E-visit).    I spent a total of 23 minutes with the patient spent in direct patient consultation.  Additional time spent with chart review  / charting (studies, outside notes,  etc):  15 min Total Time: 38 min  Current medicines are reviewed at length with the patient today.  (+/- concerns) n/a  This visit occurred during the SARS-CoV-2 public health emergency.  Safety protocols were in place, including screening questions prior to the visit, additional usage of staff PPE, and extensive cleaning of exam room while observing appropriate contact time as indicated for disinfecting solutions.  Notice: This dictation was prepared with Dragon dictation along with smart phrase technology. Any transcriptional errors that result from  this process are unintentional and may not be corrected upon review.  Patient Instructions / Medication Changes & Studies & Tests Ordered   Patient Instructions  Medication Instructions:   No changes  *If you need a refill on your cardiac medications before your next appointment, please call your pharmacy*   Lab Work: fasting  Cmp Lipid Hgba1c  If you have labs (blood work) drawn today and your tests are completely normal, you will receive your results only by: MyChart Message (if you have MyChart) OR A paper copy in the mail If you have any lab test that is abnormal or we need to change your treatment, we will call you to review the results.   Testing/Procedures: Will be schedule at [facility] - Radiology  Non-Cardiac CT scanning, (CAT scanning),( Aorta)  is a noninvasive, special x-ray that produces cross-sectional images of the body using x-rays and a computer. CT scans help physicians diagnose and treat medical conditions. For some CT exams, a contrast material is used to enhance visibility in the area of the body being studied. CT scans provide greater clarity and reveal more details than regular x-ray exams.    Follow-Up: At [facility], you and your health needs are our priority.  As part of our continuing mission to provide you with exceptional heart care, we have created designated Provider Care Teams.  These Care Teams include your primary Cardiologist (physician) and Advanced Practice Providers (APPs -  Physician Assistants and Nurse Practitioners) who all work together to provide you with the care you need, when you need it.  We recommend signing up for the patient portal called "MyChart".  Sign up information is provided on this After Visit Summary.  MyChart is used to connect with patients for Virtual Visits (Telemedicine).  Patients are able to view lab/test results, encounter notes, upcoming appointments, etc.  Non-urgent messages can be sent to your provider as  well.   To learn more about what you can do with MyChart, go to [email or web address].    Your next appointment:   10 to 11 month(s) SEPT or OCT 2023  The format for your next appointment:   In Person  Provider:   [name], MD        Studies Ordered:   Orders Placed This Encounter  Procedures   CT ANGIO CHEST AORTA W/CM & OR WO/CM   Lipid panel   Comprehensive metabolic panel   Hemoglobin A1c   EKG 12-Lead      Zachary Flynn, M.D., M.S. Interventional Cardiologist   Pager # [phone] Phone # [phone] [address] [place], [place] [address]   Thank you for choosing Heartcare at [facility]!!

## 2021-01-15 NOTE — Patient Instructions (Addendum)
Medication Instructions:   No changes  *If you need a refill on your cardiac medications before your next appointment, please call your pharmacy*   Lab Work: fasting  Cmp Lipid Hgba1c  If you have labs (blood work) drawn today and your tests are completely normal, you will receive your results only by: [place] (if you have MyChart) OR A paper copy in the mail If you have any lab test that is abnormal or we need to change your treatment, we will call you to review the results.   Testing/Procedures: Will be schedule at [facility] - Radiology  Non-Cardiac CT scanning, (CAT scanning),( Aorta)  is a noninvasive, special x-ray that produces cross-sectional images of the body using x-rays and a computer. CT scans help physicians diagnose and treat medical conditions. For some CT exams, a contrast material is used to enhance visibility in the area of the body being studied. CT scans provide greater clarity and reveal more details than regular x-ray exams.    Follow-Up: At [facility], you and your health needs are our priority.  As part of our continuing mission to provide you with exceptional heart care, we have created designated Provider Care Teams.  These Care Teams include your primary Cardiologist (physician) and Advanced Practice Providers (APPs -  Physician Assistants and Nurse Practitioners) who all work together to provide you with the care you need, when you need it.  We recommend signing up for the patient portal called "MyChart".  Sign up information is provided on this After Visit Summary.  MyChart is used to connect with patients for Virtual Visits (Telemedicine).  Patients are able to view lab/test results, encounter notes, upcoming appointments, etc.  Non-urgent messages can be sent to your provider as well.   To learn more about what you can do with MyChart, go to [email or web address].    Your next appointment:   10 to 11 month(s) SEPT or OCT 2023  The format  for your next appointment:   In Person  Provider:   [name], MD

## 2021-01-16 ENCOUNTER — Other Ambulatory Visit: Payer: Self-pay | Admitting: Cardiology

## 2021-01-17 LAB — LIPID PANEL
Chol/HDL Ratio: 3 ratio (ref 0.0–5.0)
Cholesterol, Total: 97 mg/dL — ABNORMAL LOW (ref 100–199)
HDL: 32 mg/dL — ABNORMAL LOW (ref >39)
LDL Chol Calc (NIH): 51 mg/dL (ref 0–99)
Triglycerides: 61 mg/dL (ref 0–149)
VLDL Cholesterol Cal: 14 mg/dL (ref 5–40)

## 2021-01-17 LAB — COMPREHENSIVE METABOLIC PANEL
ALT: 27 IU/L (ref 0–44)
AST: 26 IU/L (ref 0–40)
Albumin/Globulin Ratio: 2.6 — ABNORMAL HIGH (ref 1.2–2.2)
Albumin: 4.7 g/dL (ref 3.8–4.8)
Alkaline Phosphatase: 77 IU/L (ref 44–121)
BUN/Creatinine Ratio: 12 (ref 10–24)
BUN: 11 mg/dL (ref 8–27)
Bilirubin Total: 1.2 mg/dL (ref 0.0–1.2)
CO2: 26 mmol/L (ref 20–29)
Calcium: 9.5 mg/dL (ref 8.6–10.2)
Chloride: 103 mmol/L (ref 96–106)
Creatinine, Ser: 0.93 mg/dL (ref 0.76–1.27)
Globulin, Total: 1.8 g/dL (ref 1.5–4.5)
Glucose: 143 mg/dL — ABNORMAL HIGH (ref 70–99)
Potassium: 4.4 mmol/L (ref 3.5–5.2)
Sodium: 142 mmol/L (ref 134–144)
Total Protein: 6.5 g/dL (ref 6.0–8.5)
eGFR: 93 mL/min/{1.73_m2} (ref >59)

## 2021-01-17 LAB — HEMOGLOBIN A1C
Est. average glucose Bld gHb Est-mCnc: 140 mg/dL
Hgb A1c MFr Bld: 6.5 % — ABNORMAL HIGH (ref 4.8–5.6)

## 2021-01-19 ENCOUNTER — Encounter: Payer: Self-pay | Admitting: Cardiology

## 2021-01-19 NOTE — Assessment & Plan Note (Signed)
A1c was 6.5 in April, will recheck to make sure he is not going the wrong direction.

## 2021-01-19 NOTE — Assessment & Plan Note (Signed)
Not really aneurysmal dilation of the aorta.  He did have slight increase in the aortic size on recent echocardiogram.  Would want to make sure that he does not have progressive enlargement of the aortic root.  Plan: CT Angio Chest Aorta -> He will come in to get his chemistry panel checked we can go and treatment with his well.

## 2021-01-19 NOTE — Assessment & Plan Note (Signed)
He says his blood pressure is better than it then measured here.  Continue to monitor at home.  At home he says his systolic pressures are more 120-130 mmHg.  Continue to monitor pressures.  He is on max dose of losartan, but we could potentially increase carvedilol.  In the past, I have ordered titrating the beta-blocker further because of borderline recommended cardiac.  He may be a tolerate slightly higher dose versus adding a thiazide diuretic.

## 2021-01-19 NOTE — Assessment & Plan Note (Addendum)
Sizable LAD stent jailing the diagonal branch.  Because of the location of his lesion, I have chosen to continue on maintenance Brilinta 60 mg twice daily.   Okay to hold Brilinta up to 7 days preop surgeries or procedures.  Restart when safe.Marland Kitchen

## 2021-01-19 NOTE — Assessment & Plan Note (Signed)
Due for recheck of labs, we can do a check lipids when he comes back indicate his recently labs done.  We will also check A1c.  Most recent lipids showed LDL of 57 which is pretty much at goal.  He is doing fine on the 20 mg rosuvastatin.  Hopefully with his continued exercise, we can continue on the lower dose.

## 2021-01-19 NOTE — Assessment & Plan Note (Signed)
He had a pretty significant anterior infarct with mildly reduced EF on Echo in follow-up after his MI.  Has residual 50 to 60% ramus intermedius lesion as well as ostial diagonal lesion 305, symptomatic standpoint.  Plan:   Continue maintenance dose Brilinta as monotherapy.  Continue ARB and beta-blocker-May need to titrate further.  Continue current dose of statin

## 2021-02-05 ENCOUNTER — Encounter (HOSPITAL_COMMUNITY): Payer: Self-pay

## 2021-02-05 ENCOUNTER — Ambulatory Visit (HOSPITAL_COMMUNITY)
Admission: RE | Admit: 2021-02-05 | Discharge: 2021-02-05 | Disposition: A | Discharge status: Home / Self Care | Payer: 59 | Source: Ambulatory Visit | Attending: Cardiology | Admitting: Cardiology

## 2021-02-05 DIAGNOSIS — I7121 Aneurysm of the ascending aorta, without rupture: Secondary | ICD-10-CM | POA: Insufficient documentation

## 2021-02-05 DIAGNOSIS — I77810 Thoracic aortic ectasia: Secondary | ICD-10-CM | POA: Diagnosis present

## 2021-02-05 MED ORDER — IOHEXOL 350 MG/ML SOLN
75.0000 mL | Freq: Once | INTRAVENOUS | Status: AC | PRN
  Administered 2021-02-05: 75 mL via INTRAVENOUS

## 2021-02-05 MED ORDER — SODIUM CHLORIDE (PF) 0.9 % IJ SOLN
INTRAMUSCULAR | Status: AC
  Filled 2021-02-05: qty 50

## 2021-02-08 ENCOUNTER — Telehealth: Payer: Self-pay | Admitting: *Deleted

## 2021-02-08 DIAGNOSIS — R9389 Abnormal findings on diagnostic imaging of other specified body structures: Secondary | ICD-10-CM

## 2021-02-08 NOTE — Telephone Encounter (Signed)
Spoke to patient . Result given. Patient reviewed mychart  message.  Patient wanted to discuss information with his primary  doctor Dr Lavina Hamman. Patient states he will be seeing  him on Tuesday 02/15/21 and call cardiology back to proceed with  referral to pulmonologist  Result route to primary

## 2021-02-08 NOTE — Telephone Encounter (Signed)
-----   Message from [name], MD sent at 02/07/2021  8:53 PM EST ----- CT angiogram of the chest results: -> Current study suggest that there is no evidence for a sending aortic aneurysm.  The maximum diameter is 37 cm which is within normal.  The celiac artery appeared to be somewhat kinked and little dilation after the kink.  This usually does not cause problems with his related to compression from the diaphragm.  Interestingly, the lung evaluation shows an unusual thickening in both lower lobes that would be suggestive of either some type of infectious process or an atypical infection.  Recommendations to follow-up with a contrast enhanced CT scan of the chest.  We will also refer to pulmonary medicine for interpretation. -->> Question if he is currently having any Respiratory Symptoms.   We will send in Referral to one of our [place] Pulmonologists - Dr. Chase Caller, Dr. Valeta Harms or [facility] (if possible) & place f/u CT Scan (Contrast Enhanced) for Feb-March).  [name], MD

## 2021-02-12 NOTE — Telephone Encounter (Signed)
LATE ENTRY   PATIENT CALLED BACK.on 02/08/21  PATIENT WOULD LIKE TO PROCEED WITH REFERRAL TO PULM. Order placed

## 2021-03-21 ENCOUNTER — Encounter: Payer: Self-pay | Admitting: Internal Medicine

## 2021-03-21 ENCOUNTER — Ambulatory Visit: Payer: 59 | Admitting: Internal Medicine

## 2021-03-21 ENCOUNTER — Other Ambulatory Visit: Payer: Self-pay

## 2021-03-21 ENCOUNTER — Ambulatory Visit (INDEPENDENT_AMBULATORY_CARE_PROVIDER_SITE_OTHER): Payer: 59

## 2021-03-21 DIAGNOSIS — R590 Localized enlarged lymph nodes: Secondary | ICD-10-CM

## 2021-03-21 LAB — SEDIMENTATION RATE: Sed Rate: 6 mm/hr (ref 0–20)

## 2021-03-21 NOTE — Assessment & Plan Note (Addendum)
Incidental finding on CT coronary study 02/05/21 - no symptoms/signs of active dz at initial pulm eval 03/21/2021  - ESR 03/21/2021  = 6 - Quant TB 03/21/2021 pending  - ACE level 03/21/2021 pending   I strongly suspect subclinical sarcoid here but given the absence of symptoms and a nl esr,  prot/alb ratio, and calcium level there is no indication for treatment, so no need for bx at this point.   I did review the pathophysiology of sarcoid and assured him most pts are all better in 3 years with or with out rx and he may already be on the end of that spectrum of dz, esp at age 61 (which is late for new onset sarcoid but certain does happen).   Discussed in detail all the  indications, usual  risks and alternatives  relative to the benefits with patient who agrees to proceed with conservative f/u as outlined    >>> f/u 6 m, sooner if needed          Each maintenance medication was reviewed in detail including emphasizing most importantly the difference between maintenance and prns and under what circumstances the prns are to be triggered using an action plan format where appropriate.  Total time for H and P, chart review, counseling,   and generating customized AVS unique to this office visit / same day charting = 46 min

## 2021-03-21 NOTE — Progress Notes (Signed)
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 CT 1 mth. Ago.C/o nasal congestion x 2-3 mths.Denies sob/cough.  Dyspnea: power walking around work and home s sob  Cough: none  Sleep: no resp cc lying flat SABA use: no inhalers   No obvious day to day or daytime variability or assoc excess/ purulent sputum or mucus plugs or hemoptysis or cp or chest tightness, subjective wheeze or overt  hb symptoms.   Sleeping  without nocturnal  or early am exacerbation  of respiratory  c/o's or need for noct saba. Also denies any obvious fluctuation of symptoms with weather or environmental changes or other aggravating or alleviating factors except as outlined above   No unusual exposure hx or h/o childhood pna/ asthma or knowledge of premature birth.  Current Allergies, Complete Past Medical History, Past Surgical History, Family History, and Social History were reviewed in [employer] record.  ROS  The following are not active complaints unless bolded Hoarseness, sore throat, dysphagia, dental problems, itching, sneezing,  nasal congestion(gradually improved vs prior years)  or discharge of excess mucus or purulent secretions, ear ache,   fever, chills, sweats, unintended wt loss or wt gain, classically pleuritic or exertional cp,  orthopnea pnd or arm/hand swelling  or leg swelling, presyncope, palpitations, abdominal pain, anorexia, nausea, vomiting, diarrhea  or change in bowel habits or change in bladder habits, change in stools or change in urine, dysuria, hematuria,  rash, arthralgias, visual complaints, headache, numbness, weakness or  ataxia or problems with walking or coordination,  change in mood or  memory.           Past Medical History:  Diagnosis Date   CAD (coronary artery disease) 12/02/2018   a. 100% proximal LAD insetting of anterior STEMI--PCI: Resolute DES 3.0 mm x 22 mm (postdilated 3.5-3.1 mm).  Also 50% proximal RI.  Left dominant system.  Normal EF by LV gram.   Cardiogenic shock ([place]) 12/2018   Mild shock complicating anterior STEMI; did not require mechanical support   Cervical disc disease    Chronic diastolic CHF (congestive heart failure), NYHA class 1 ([place]) 11/2018   a. LVEDP elevated at time of MI ->; GRII DD on follow-up echo   Erectile dysfunction    Essential hypertension 12/02/2018   Hyperlipidemia with target LDL less than 70 12/02/2018   Mild dilation of ascending aorta ([place])    a. by echo 12/2018.   Pre-diabetes     Outpatient Medications Prior to Visit  Medication Sig Dispense Refill   BRILINTA 60 MG TABS tablet TAKE ONE TABLET BY MOUTH TWICE A DAY 60 tablet 11   carvedilol (COREG) 6.25 MG tablet TAKE ONE TABLET BY MOUTH TWICE A DAY WITH A MEAL 180 tablet 3   nitroGLYCERIN (NITROSTAT) 0.4 MG SL tablet Place 1 tablet (0.4 mg total) under the tongue every 5 (five) minutes as needed for chest pain. 25 tablet 6   rosuvastatin (CRESTOR) 20 MG tablet TAKE ONE TABLET BY MOUTH DAILY * DISCONTINUE PREVIOUS DOSE OF 40 MG 90 tablet 3   sildenafil (REVATIO)  20 MG tablet Take 20 mg by mouth daily as needed for erectile dysfunction.     valsartan (DIOVAN) 320 MG tablet TAKE ONE TABLET BY MOUTH DAILY 30 tablet 11   No facility-administered medications prior to visit.     Objective:     BP 140/80 (BP Location: Right Arm, Cuff Size: Normal)    Pulse 65    Temp 98.1 F (36.7 C) (Temporal)    Ht _0  (1.702 m)    Wt 186 lb (84.4 kg)    SpO2 99% Comment: RA   BMI 29.13 kg/m   SpO2: 99 % (RA)   HEENT : pt wearing mask not removed for exam due to covid -19 concerns.    NECK :  without  JVD/Nodes/TM/ nl carotid upstrokes bilaterally   LUNGS: no acc muscle use,  Nl contour chest which is clear to A and P bilaterally without cough on insp or exp maneuvers   CV:  RRR  no s3 or murmur or increase in P2, and no edema   ABD:  soft and nontender with nl inspiratory excursion in the supine position. No bruits or organomegaly appreciated, bowel sounds nl  MS:  Nl gait/ ext warm without deformities, calf tenderness, cyanosis or clubbing No obvious joint restrictions   SKIN: warm and dry without lesions    NEURO:  alert, approp, nl sensorium with  no motor or cerebellar deficits apparent.    I personally reviewed images and agree with radiology impression as follows:   Chest CTa  02/05/21 Pattern of bronchial wall thickening of predominantly the bilateral lower lobes segmental and subsegmental airways. Associated mild right greater than left hilar lymph nodes and small lymph nodes of the mediastinum. Leading differential diagnosis would be infectious such as bronchitis or other atypical infection, however, chronic granulomatous disease including sarcoid could have this appearance.  CXR PA and Lateral:   03/21/2021 :    I personally reviewed images and agree with radiology impression as follows:   Peribronchial thickening and interstitial changes at the lung bases probably corresponds to abnormality on chest CT. There is no bulky adenopathy identified by radiograph.     Assessment   Hilar adenopathy Incidental finding on CT coronary study 02/05/21 - no symptoms/signs of active dz at initial pulm eval 03/21/2021  - ESR 03/21/2021  = 6 - Quant TB 03/21/2021 pending  - ACE level 03/21/2021 pending   I strongly suspect subclinical sarcoid here but given the absence of symptoms and a nl esr,  prot/alb ratio, and calcium level there is no indication for treatment, so no need for bx at this point.   I did review the pathophysiology of sarcoid and assured him most pts are all  better in 3 years with or with out rx and he may already be on the end of that spectrum of dz, esp at age 61 (which is late for new onset sarcoid but certain does happen).   Discussed in detail all the  indications, usual  risks and alternatives  relative to the benefits with patient who agrees to proceed with conservative f/u as outlined    >>> f/u 6 m, sooner if needed          Each maintenance medication was reviewed in detail including emphasizing most importantly the difference between maintenance and prns and under what circumstances the prns are to be triggered using an action plan format where appropriate.  Total time for H and P, chart review, counseling,   and  generating customized AVS unique to this office visit / same day charting = 46 min           Christinia Gully, MD 03/21/2021

## 2021-03-21 NOTE — Patient Instructions (Signed)
Sarcoidosis is a benign inflammatory condition caused by  The  immune system being too revved up like a thermostat on your furnace that's partially  stuck causing arthitis, rash, short of breath and cough and vision issues.  It typically burns itself out in 75% of patients by the end of 3 years with little to indicate that we really change the natural course of the disease by aggressive treatments intended to alter it.  Treatment is generally reserved for patients with major symptoms we can attribute to sarcoid or if a vital organ (like the eye or kidney or nervous system) becomes affected.    Please remember to go to the lab and x-ray department  for your tests - we will call you with the results when they are available.       Please schedule a follow up visit in 6 months but call sooner if needed

## 2021-03-25 LAB — QUANTIFERON-TB GOLD PLUS
Mitogen-NIL: >10 IU/mL
NIL: 0.03 IU/mL
QuantiFERON-TB Gold Plus: NEGATIVE
TB1-NIL: 0 IU/mL
TB2-NIL: 0 IU/mL

## 2021-03-25 LAB — ANGIOTENSIN CONVERTING ENZYME: Angiotensin-Converting Enzyme: 37 U/L (ref 9–67)

## 2021-03-27 NOTE — Progress Notes (Signed)
LMTCB

## 2021-04-19 ENCOUNTER — Encounter: Payer: Self-pay | Admitting: *Deleted

## 2021-04-19 NOTE — Progress Notes (Signed)
LMTCB

## 2021-04-19 NOTE — Progress Notes (Signed)
Letter mailed

## 2021-04-26 ENCOUNTER — Telehealth: Payer: Self-pay | Admitting: Internal Medicine

## 2021-04-26 NOTE — Telephone Encounter (Signed)
Called and spoke to patient about test results. Nothing further needed.

## 2021-07-15 ENCOUNTER — Other Ambulatory Visit: Payer: Self-pay | Admitting: Cardiology

## 2021-09-13 ENCOUNTER — Other Ambulatory Visit: Payer: Self-pay | Admitting: Adult Health

## 2021-09-17 ENCOUNTER — Ambulatory Visit: Payer: 59 | Admitting: Orthopaedic Surgery

## 2021-09-24 ENCOUNTER — Ambulatory Visit (INDEPENDENT_AMBULATORY_CARE_PROVIDER_SITE_OTHER): Payer: 59

## 2021-09-24 ENCOUNTER — Ambulatory Visit: Payer: 59 | Admitting: Orthopaedic Surgery

## 2021-09-24 VITALS — Ht 67.0 in

## 2021-09-24 DIAGNOSIS — M25552 Pain in left hip: Secondary | ICD-10-CM

## 2021-09-24 NOTE — Progress Notes (Signed)
The patient is a very pleasant 61 year old gentleman who comes in for assessment of his left total hip arthroplasty.  His left hip was replaced the posterior approach back in 2000 and he is never had any issues with that at all.  Dr. Durward Fortes performed the surgery.  He said recently he had had some left thigh pain and was felt that he needed to be checked out to make sure everything was going okay with the hip.  He denies any pain today at all.  He is a thin individual and is not a diabetic.  He does not walk with a limp at all and is again pain-free today.  His left hip moves smoothly and fluidly and there is no pain in the thigh or in the groin on exam today at all.  His leg lengths are equal.  He walks with a normal stride and normal gait.  An AP pelvis and lateral left hip shows a well-seated total hip arthroplasty with no evidence of loosening.  There is no evidence of osteolysis in the prosthetic hip ball is in the central position within the acetabular component.  Since he is pain-free right now, I would recommend just still watching this under observation.  If it becomes more problematic he will come back and see [place] and I would next obtain a three-phase bone scan to rule out prosthetic loosening and also potentially a MRI.  He agrees with this treatment plan as well.  All question concerns were answered and addressed.

## 2021-12-05 ENCOUNTER — Ambulatory Visit: Payer: 59 | Attending: Cardiology | Admitting: Cardiology

## 2021-12-05 ENCOUNTER — Encounter: Payer: Self-pay | Admitting: Cardiology

## 2021-12-05 VITALS — BP 140/80 | HR 60 | Ht 68.0 in | Wt 181.8 lb

## 2021-12-05 DIAGNOSIS — Z955 Presence of coronary angioplasty implant and graft: Secondary | ICD-10-CM

## 2021-12-05 DIAGNOSIS — I25110 Atherosclerotic heart disease of native coronary artery with unstable angina pectoris: Secondary | ICD-10-CM

## 2021-12-05 DIAGNOSIS — I77810 Thoracic aortic ectasia: Secondary | ICD-10-CM

## 2021-12-05 DIAGNOSIS — E785 Hyperlipidemia, unspecified: Secondary | ICD-10-CM

## 2021-12-05 DIAGNOSIS — I2102 ST elevation (STEMI) myocardial infarction involving left anterior descending coronary artery: Secondary | ICD-10-CM | POA: Diagnosis not present

## 2021-12-05 DIAGNOSIS — I10 Essential (primary) hypertension: Secondary | ICD-10-CM | POA: Diagnosis not present

## 2021-12-05 DIAGNOSIS — I2510 Atherosclerotic heart disease of native coronary artery without angina pectoris: Secondary | ICD-10-CM | POA: Diagnosis not present

## 2021-12-05 DIAGNOSIS — R7303 Prediabetes: Secondary | ICD-10-CM

## 2021-12-05 NOTE — Patient Instructions (Signed)
Medication Instructions:   No changes   Keep a blood log of your reading   Check in the morning and evening  on separate days .  Will obtain reading once  we call you in regards to lab results in Nov 2023   *If you need a refill on your cardiac medications before your next appointment, please call your pharmacy*   Lab Work: fasting - Nov 2023 CMP LIPID [id number] If you have labs (blood work) drawn today and your tests are completely normal, you will receive your results only by: [place] (if you have MyChart) OR A paper copy in the mail If you have any lab test that is abnormal or we need to change your treatment, we will call you to review the results.   Testing/Procedures:  Not needed  Follow-Up: At [facility], you and your health needs are our priority.  As part of our continuing mission to provide you with exceptional heart care, we have created designated Provider Care Teams.  These Care Teams include your primary Cardiologist (physician) and Advanced Practice Providers (APPs -  Physician Assistants and Nurse Practitioners) who all work together to provide you with the care you need, when you need it.     Your next appointment:   4 month(s)  The format for your next appointment:   In Person  Provider:   Coletta Memos, FNP    Then, [name], MD will plan to see you again in 12 month(s).   Other Instructions

## 2021-12-05 NOTE — Progress Notes (Signed)
Primary Care Provider: Lawerance Cruel, MD [place] Cardiologist: [name], MD Electrophysiologist: None  Clinic Note: Chief Complaint  Patient presents with   Annual Follow-Up    Doing well   Coronary Artery Disease    No angina or heart failure   ===================================  ASSESSMENT/PLAN   Problem List Items Addressed This Visit       Cardiology Problems   Coronary artery disease involving native coronary artery of native heart without angina pectoris - Primary (Chronic)    Really only single-vessel disease with LAD PCI.  Now without any further anginal symptoms in fact following MI.  Good recovery of function.  Is on stable regimen of beta-blocker, ARB, statin and Thienopyridine.  For now, no additional changes, but if blood pressure continues to be elevated would probably consider calcium channel blocker such as amlodipine, versus thiazide diuretic.        Relevant Orders   EKG 12-Lead (Completed)   Lipid panel   Comprehensive metabolic panel   Hemoglobin A1c   STEMI involving left anterior descending coronary artery (HCC) (Chronic)    He is now 3 years out from his MI.  Doing well.  Fully back into of lifestyle that is healthy with no recurrent anginal or heart failure symptoms.  On stable regimen.  EF is on the low normal to mildly reduced level with LAD distribution hypokinesis (EF about 45 to 55%.)  Stable      Relevant Orders   EKG 12-Lead (Completed)   Comprehensive metabolic panel   Essential hypertension (Chronic)    Clearly has some whitecoat hypertension symptoms.  I have asked that he monitor his blood pressures at home and bring in a log.  He can discuss this with his PCP in follow-up.  If pressures are high, would probably consider diuretic first based on guidelines, however calcium channel blocker such as amlodipine would be a reasonable option as well.  He is already on max dose ARB, and max possible dose of  carvedilol based on heart rate of 60 bpm.      Relevant Orders   Comprehensive metabolic panel   Hyperlipidemia with target LDL less than 70 (Chronic)    Labs from last November were outstanding with LDL of 51 on current dose of statin.  He seems to be tolerating relatively well.  Would be due for follow-up labs in November of this year.  I have ordered lipids as well as A1c and chemistry panel.  Last A1c was 6.7, not on any medications.  Would consider SGLT2 inhibitor plus or minus metformin as reasonable options.      Relevant Orders   Lipid panel   Comprehensive metabolic panel   Hemoglobin A1c   Mild dilation of ascending aorta (HCC) (Chronic)    Reassuring finding on his CT scan.  Nodularity seen thought to potentially be related to subclinical sarcoid.  Defer to PCP and pulmonary medicine.      Relevant Orders   EKG 12-Lead (Completed)   Comprehensive metabolic panel     Other   Presence of drug coated stent in LAD coronary artery (Chronic)    Lengthy stent in the LAD jailing the diagonal branch.  Due to the location of the stent being in the proximal LAD with a jailed diagonal, have chosen to use long-term Thienopyridine monotherapy for preventative maintenance. He is on maintenance dose Brilinta 60 mg twice daily and tolerating it well.  No longer on aspirin.  Okay to hold Brilinta 5  days preop for surgeries or procedures.  (7 days for high risk procedure such as neurologic/spinal procedures or organ biopsies.) Restart when safe postop.  Hopefully within 2 days.      Relevant Orders   EKG 12-Lead (Completed)   Comprehensive metabolic panel   Pre-diabetes    Add an A1c of 6.7 in November.  He has made some lifestyle changes.  Hopefully will improve.  I have ordered A1c to follow-up along with his lipids and chemistry panel.  Defer to PCP.      Relevant Orders   EKG 12-Lead (Completed)   Lipid panel   Comprehensive metabolic panel   Hemoglobin A1c     ===================================  HPI:    Zachary Flynn is a 61 y.o. male with a PMH below who presents today for early annual follow-up.  He returns at the request of Lawerance Cruel, MD.  [facility] notable for CAD (anterior STEMI September 2020), HTN, HLD and PreDM2, and Mild Thoracic Aortic Dilation.     12/02/2018: Anterior STEMI-proximal mid LAD 100% with 50% D1-resolute Onyx DES 3.0 mm a 22 mm (postdilated to 3.5-3.1 mm.  Jailed diagonal 50%.  EF 55 to 65%.  Severely elevated LVEDP.  DORON SHAKE was last seen on January 15, 2021: Doing very well.  He was able to when the office walking competition.  Having 1 the competition he moved on to make this a standard part of his activity level.  He was wearing running shoes/walking shoes during work days developed go for walks and take longer strolls around the campus.  Remains completely asymptomatic from a car standpoint.  Just some mild orthostatic dizziness if he under hydrates.  No changes made.  Labs ordered.  Remains on maintenance dose Brilinta monotherapy.  Noted that his blood pressure is usually was always high in the office but normally better at home. Ordered labs and follow-up CT angio of the chest to evaluate ascending aorta.  Recent Hospitalizations: None  Reviewed  CV studies:    The following studies were reviewed today: (if available, images/films reviewed: From Epic Chart or Care Everywhere) CTA Chest 02/05/2021: No significant aortic valve calcification.  No significant calcified atheromatous changes of the aortic branches.  Three-vessel arch.  Patent cerebral vessels at the cervical level.  No evidence of a sending aortic aneurysm.  Maximum diameter was 3.7 cm.  Coronary disease noted.  Bronchial thickening, predominantly lower lobes in the segmental subsegmental airways.  Associated right greater than left hilar lymph nodes.  Suggested either bronchitis or other atypical infection but consideration of chronic  granulomatous disease or malignancy less likely.  Recommended follow-up CT scan in 2 to 3 months.. => Findings refer to PCP. Seen by Dr. Melvyn Novas from pulmonary medicine in January who suspected subclinical sarcoid, but felt that there is no indication for treatment or biopsy.  Plan 2-monthfollow-up (has not happened)   Interval History:   RLAZLO TUNNEYreturns here today for routine follow-up doing well.  He still walking quite a bit.  No longer in the competition phase of his walking steps meter.  He is very active and has been monitoring his diet.  He tells me at home his cyst blood pressures are anywhere from the 130s to 140s (this morning was low 130s).  He says that he is always had high blood pressures in doctors offices.  Other than the blood pressure concern, he is not really having any symptoms at all of angina, heart failure, arrhythmias, syncope  or near syncope.  He has been working really hard on try to hydrate to avoid that with a dizziness.  CV Review of Symptoms (Summary): no chest pain or dyspnea on exertion negative for - edema, loss of consciousness, orthopnea, palpitations, paroxysmal nocturnal dyspnea, rapid heart rate, shortness of breath, or lightheadedness, dizziness or wooziness, syncope/near syncope or TIA/amaurosis fugax.  Claudication  REVIEWED OF SYSTEMS   Review of Systems  Constitutional:  Negative for malaise/fatigue and weight loss.  HENT:  Negative for congestion and nosebleeds.   Gastrointestinal:  Negative for blood in stool and melena.  Genitourinary:  Negative for hematuria.  Musculoskeletal:  Negative for joint pain (Just mild arthritic pains) and myalgias.  Neurological:  Negative for dizziness (Working hard on hydrating) and focal weakness.  Psychiatric/Behavioral: Negative.     I have reviewed and (if needed) personally updated the patient's problem list, medications, allergies, past medical and surgical history, social and family history.   PAST  MEDICAL HISTORY   Past Medical History:  Diagnosis Date   CAD (coronary artery disease) 12/02/2018   a. 100% proximal LAD insetting of anterior STEMI--PCI: Resolute DES 3.0 mm x 22 mm (postdilated 3.5-3.1 mm).  Also 50% proximal RI.  Left dominant system.  Normal EF by LV gram.   Cardiogenic shock ([place]) 12/2018   Mild shock complicating anterior STEMI; did not require mechanical support   Cervical disc disease    Chronic diastolic CHF (congestive heart failure), NYHA class 1 ([place]) 11/2018   a. LVEDP elevated at time of MI ->; GRII DD on follow-up echo   Erectile dysfunction    Essential hypertension 12/02/2018   Hyperlipidemia with target LDL less than 70 12/02/2018   Mild dilation of ascending aorta ([place])    a. by echo 12/2018.   Pre-diabetes     PAST SURGICAL HISTORY   Past Surgical History:  Procedure Laterality Date   CERVICAL SPINE SURGERY  2012   Dr. Drema Pry ACUTE MI REVASCULARIZATION N/A 12/02/2018   Procedure: Coronary/Graft Acute MI Revascularization-CORONARY STENT INTERVENTION;  Surgeon: [name], MD;  Location: [facility];  Service: Cardiovascular;; p-mLAD 100% w/ 50% D1 ostial dz ( PCI - Resolute Onyx DES 3.0 mm x 32 mm --postdilated 3.5-3.1 mm).   LEFT HEART CATH AND CORONARY ANGIOGRAPHY N/A 12/02/2018   Procedure: LEFT HEART CATH AND CORONARY ANGIOGRAPHY;  Surgeon: [name], MD;  Location: [facility]; INDICATION-ANTERIOR STEMI: p-mLAD 100% w/ 50% D1 ostial dz (DES PCI).  Moderate RI 55 %.  EF 55 to 60% (preliminary LV gram showed anterior hypokinesis, resolved following PCI).  Severely elevated LVEDP of 29 mmHg (acute diasto   REVISION TOTAL HIP ARTHROPLASTY Left 2003   TOTAL HIP ARTHROPLASTY Left 2000   Dr. Durward Fortes   TRANSTHORACIC ECHOCARDIOGRAM  12/02/2018   (In setting of anterior STEMI) EF 50-55%.  Apical septal and apical anterior akinesis.  Moderate LVH.  GR 1 DD.  Normal LA and RA size.  No valvular disease.  Mildly  dilated ascending aorta of 39 mm.   TRANSTHORACIC ECHOCARDIOGRAM  11/24/2019   EF 45 to 50%.  Mildly decreased function.  Hypokinesis of the mid-apical anteroseptal and apical wall.  GRII DD.  (Pseudonormalization).  Normal RV size and function.  Mildly dilated ascending aorta 43 mm.  Normal CVP.   September 2020:-PCI for anterior STEMI; p-mLAD 100% w/ 50% D1 ostial dz ( PCI - Resolute Onyx DES 3.0 mm x 32 mm --postdilated 3.5-3.1 mm).  Immunization History  Administered Date(s) Administered   PFIZER(Purple Top)SARS-COV-2 Vaccination 05/08/2019, 05/29/2019   Tdap 01/02/2012   Zoster Recombinat (Shingrix) 07/15/2019    MEDICATIONS/ALLERGIES   Current Meds  Medication Sig   BRILINTA 60 MG TABS tablet TAKE ONE TABLET BY MOUTH TWICE A DAY   carvedilol (COREG) 6.25 MG tablet TAKE ONE TABLET BY MOUTH TWICE A DAY WITH MEALS   rosuvastatin (CRESTOR) 20 MG tablet TAKE ONE TABLET BY MOUTH DAILY   sildenafil (REVATIO) 20 MG tablet Take 20 mg by mouth daily as needed for erectile dysfunction.   valsartan (DIOVAN) 320 MG tablet TAKE ONE TABLET BY MOUTH DAILY    Allergies  Allergen Reactions   Food     Lactose intolerant, no dairy products   Lisinopril Cough   Pollen Extract     SOCIAL HISTORY/FAMILY HISTORY   Reviewed in Epic:  Pertinent findings:  Social History   Tobacco Use   Smoking status: Never   Smokeless tobacco: Never  Vaping Use   Vaping Use: Never used  Substance Use Topics   Alcohol use: Never   Drug use: Never   Social History   Social History Narrative   Not on file    OBJCTIVE -PE, EKG, labs   Wt Readings from Last 3 Encounters:  12/05/21 181 lb 12.8 oz (82.5 kg)  03/21/21 186 lb (84.4 kg)  01/15/21 183 lb 9.6 oz (83.3 kg)    Physical Exam: BP (!) 140/80   Pulse 60   Ht '5\' 8"'$  (1.727 m)   Wt 181 lb 12.8 oz (82.5 kg)   SpO2 100%   BMI 27.64 kg/m  Vitals:   12/05/21 0756 12/05/21 0825  BP: (!) 162/84 (!) 140/80  Pulse: 60   Height: '5\' 8"'$   (1.727 m)   Weight: 181 lb 12.8 oz (82.5 kg)   SpO2: 100%   BMI (Calculated): 27.65     Physical Exam Vitals reviewed.  Constitutional:      General: He is not in acute distress.    Appearance: Normal appearance. He is not ill-appearing (Healthy-appearing.  Well-groomed.) or toxic-appearing.  HENT:     Head: Normocephalic and atraumatic.  Neck:     Vascular: No carotid bruit or JVD.  Cardiovascular:     Rate and Rhythm: Normal rate and regular rhythm. No extrasystoles are present.    Chest Wall: PMI is not displaced.     Pulses: Normal pulses.     Heart sounds: S1 normal and S2 normal. No murmur heard.    No friction rub. Gallop present. S4 sounds present.  Pulmonary:     Effort: Pulmonary effort is normal. No respiratory distress.     Breath sounds: Normal breath sounds. No wheezing, rhonchi or rales.  Musculoskeletal:        General: No swelling. Normal range of motion.     Cervical back: Normal range of motion and neck supple.  Skin:    General: Skin is warm and dry.  Neurological:     General: No focal deficit present.     Mental Status: He is alert and oriented to person, place, and time.     Motor: No weakness.     Gait: Gait normal.  Psychiatric:        Mood and Affect: Mood normal.        Behavior: Behavior normal.        Thought Content: Thought content normal.        Judgment: Judgment normal.  Adult ECG Report  Rate: 60;  Rhythm: normal sinus rhythm; incomplete RBBB, LAFB.  LVH with repull changes.  Set total MI, age-indeterminate.  TW inversions in lateral leads.  Narrative Interpretation: Stable, T wave inversions less prominent.  Recent Labs: 2.  Due for follow-up labs. Lab Results  Component Value Date   CHOL 97 (L) 01/17/2021   HDL 32 (L) 01/17/2021   LDLCALC 51 01/17/2021   TRIG 61 01/17/2021   CHOLHDL 3.0 01/17/2021   Lab Results  Component Value Date   CREATININE 0.93 01/17/2021   BUN 11 01/17/2021   NA 142 01/17/2021   K 4.4 01/17/2021    CL 103 01/17/2021   CO2 26 01/17/2021      Latest Ref Rng & Units 02/11/2019    8:21 AM 12/02/2018    4:36 AM 12/02/2018    2:03 AM  CBC  WBC 3.4 - 10.8 x10E3/uL 5.1  13.6    Hemoglobin 13.0 - 17.7 g/dL 13.5  14.7  13.3   Hematocrit 37.5 - 51.0 % 42.0  46.3  39.0   Platelets 150 - 450 x10E3/uL 175  235      Lab Results  Component Value Date   HGBA1C 6.5 (H) 01/17/2021   Lab Results  Component Value Date   TSH 1.404 12/02/2018    ================================================== I spent a total of 21 minutes with the patient spent in direct patient consultation.  Additional time spent with chart review  / charting (studies, outside notes, etc): 14 min Total Time: 35 min  Current medicines are reviewed at length with the patient today.  (+/- concerns) none  Notice: This dictation was prepared with Dragon dictation along with smart phrase technology. Any transcriptional errors that result from this process are unintentional and may not be corrected upon review.  Studies Ordered:   Orders Placed This Encounter  Procedures   Lipid panel   Comprehensive metabolic panel   Hemoglobin A1c   EKG 12-Lead   No orders of the defined types were placed in this encounter.   Patient Instructions / Medication Changes & Studies & Tests Ordered   Patient Instructions  Medication Instructions:   No changes   Keep a blood log of your reading   Check in the morning and evening  on separate days .  Will obtain reading once  we call you in regards to lab results in Nov 2023   *If you need a refill on your cardiac medications before your next appointment, please call your pharmacy*   Lab Work: fasting - Nov 2023 CMP LIPID HgbA1c If you have labs (blood work) drawn today and your tests are completely normal, you will receive your results only by: [place] (if you have MyChart) OR A paper copy in the mail If you have any lab test that is abnormal or we need to change your  treatment, we will call you to review the results.   Testing/Procedures:  Not needed  Follow-Up: At [facility], you and your health needs are our priority.  As part of our continuing mission to provide you with exceptional heart care, we have created designated Provider Care Teams.  These Care Teams include your primary Cardiologist (physician) and Advanced Practice Providers (APPs -  Physician Assistants and Nurse Practitioners) who all work together to provide you with the care you need, when you need it.     Your next appointment:   4 month(s)  The format for your next appointment:   In Person  Provider:   Coletta Memos, FNP    Then, [name], MD will plan to see you again in 12 month(s).   Other Instructions  Maintain BP log    [name], MD, MS [name], M.D., M.S. Interventional Cardiologist  [place]  Pager # [phone] Phone # [phone] [address] [place], [place] [address]   Thank you for choosing [facility] [place]!!

## 2021-12-09 ENCOUNTER — Encounter: Payer: Self-pay | Admitting: Cardiology

## 2021-12-09 NOTE — Assessment & Plan Note (Signed)
He is now 3 years out from his MI.  Doing well.  Fully back into of lifestyle that is healthy with no recurrent anginal or heart failure symptoms.  On stable regimen.  EF is on the low normal to mildly reduced level with LAD distribution hypokinesis (EF about 45 to 55%.)  Stable

## 2021-12-09 NOTE — Assessment & Plan Note (Signed)
Reassuring finding on his CT scan.  Nodularity seen thought to potentially be related to subclinical sarcoid.  Defer to PCP and pulmonary medicine.

## 2021-12-09 NOTE — Assessment & Plan Note (Addendum)
Lengthy stent in the LAD jailing the diagonal branch.  Due to the location of the stent being in the proximal LAD with a jailed diagonal, have chosen to use long-term Thienopyridine monotherapy for preventative maintenance. He is on maintenance dose Brilinta 60 mg twice daily and tolerating it well.  No longer on aspirin.   Okay to hold Brilinta 5 days preop for surgeries or procedures.  (7 days for high risk procedure such as neurologic/spinal procedures or organ biopsies.)  Restart when safe postop.  Hopefully within 2 days.

## 2021-12-09 NOTE — Assessment & Plan Note (Signed)
Add an A1c of 6.7 in November.  He has made some lifestyle changes.  Hopefully will improve.  I have ordered A1c to follow-up along with his lipids and chemistry panel.  Defer to PCP.

## 2021-12-09 NOTE — Assessment & Plan Note (Addendum)
Clearly has some whitecoat hypertension symptoms.  I have asked that he monitor his blood pressures at home and bring in a log.  He can discuss this with his PCP in follow-up.  If pressures are high, would probably consider diuretic first based on guidelines, however calcium channel blocker such as amlodipine would be a reasonable option as well.  He is already on max dose ARB, and max possible dose of carvedilol based on heart rate of 60 bpm.

## 2021-12-09 NOTE — Assessment & Plan Note (Signed)
Labs from last November were outstanding with LDL of 51 on current dose of statin.  He seems to be tolerating relatively well.  Would be due for follow-up labs in November of this year.  I have ordered lipids as well as A1c and chemistry panel.  Last A1c was 6.7, not on any medications.  Would consider SGLT2 inhibitor plus or minus metformin as reasonable options.

## 2021-12-09 NOTE — Assessment & Plan Note (Addendum)
Really only single-vessel disease with LAD PCI.  Now without any further anginal symptoms in fact following MI.  Good recovery of function.  Is on stable regimen of beta-blocker, ARB, statin and Thienopyridine.  For now, no additional changes, but if blood pressure continues to be elevated would probably consider calcium channel blocker such as amlodipine, versus thiazide diuretic.

## 2021-12-12 ENCOUNTER — Other Ambulatory Visit: Payer: Self-pay | Admitting: Cardiology

## 2021-12-25 ENCOUNTER — Other Ambulatory Visit: Payer: Self-pay

## 2022-01-08 ENCOUNTER — Other Ambulatory Visit: Payer: Self-pay | Admitting: Cardiology

## 2022-02-06 ENCOUNTER — Other Ambulatory Visit: Payer: Self-pay | Admitting: Cardiology

## 2022-02-19 LAB — COMPREHENSIVE METABOLIC PANEL
ALT: 31 IU/L (ref 0–44)
AST: 23 IU/L (ref 0–40)
Albumin/Globulin Ratio: 2.2 (ref 1.2–2.2)
Albumin: 4.4 g/dL (ref 3.9–4.9)
Alkaline Phosphatase: 79 IU/L (ref 44–121)
BUN/Creatinine Ratio: 12 (ref 10–24)
BUN: 12 mg/dL (ref 8–27)
Bilirubin Total: 1.2 mg/dL (ref 0.0–1.2)
CO2: 25 mmol/L (ref 20–29)
Calcium: 9.9 mg/dL (ref 8.6–10.2)
Chloride: 105 mmol/L (ref 96–106)
Creatinine, Ser: 1.03 mg/dL (ref 0.76–1.27)
Globulin, Total: 2 g/dL (ref 1.5–4.5)
Glucose: 147 mg/dL — ABNORMAL HIGH (ref 70–99)
Potassium: 5.3 mmol/L — ABNORMAL HIGH (ref 3.5–5.2)
Sodium: 141 mmol/L (ref 134–144)
Total Protein: 6.4 g/dL (ref 6.0–8.5)
eGFR: 82 mL/min/{1.73_m2} (ref >59)

## 2022-02-19 LAB — LIPID PANEL
Chol/HDL Ratio: 3 ratio (ref 0.0–5.0)
Cholesterol, Total: 105 mg/dL (ref 100–199)
HDL: 35 mg/dL — ABNORMAL LOW (ref >39)
LDL Chol Calc (NIH): 58 mg/dL (ref 0–99)
Triglycerides: 49 mg/dL (ref 0–149)
VLDL Cholesterol Cal: 12 mg/dL (ref 5–40)

## 2022-02-19 LAB — HEMOGLOBIN A1C
Est. average glucose Bld gHb Est-mCnc: 148 mg/dL
Hgb A1c MFr Bld: 6.8 % — ABNORMAL HIGH (ref 4.8–5.6)

## 2022-02-21 ENCOUNTER — Telehealth: Payer: Self-pay | Admitting: *Deleted

## 2022-02-21 DIAGNOSIS — I2510 Atherosclerotic heart disease of native coronary artery without angina pectoris: Secondary | ICD-10-CM

## 2022-02-21 DIAGNOSIS — R899 Unspecified abnormal finding in specimens from other organs, systems and tissues: Secondary | ICD-10-CM

## 2022-02-21 DIAGNOSIS — I10 Essential (primary) hypertension: Secondary | ICD-10-CM

## 2022-02-21 NOTE — Telephone Encounter (Signed)
-----   Message from [name], MD sent at 02/19/2022 11:33 PM EST ----- Labs look like the may have gone slightly the wrong way.  Hemoglobin C is up from 6.5 to now 6.8 which actually puts you in the diabetes range. This actually goes along with a blood glucose level of 147.  But the potassium is little high today. Would recommend holding the valsartan and for 2 days 0- increase oral fluid hydration.   We should have you have a chemistry panel checked in 1 -2week.  [name]

## 2022-02-21 NOTE — Telephone Encounter (Signed)
Patient reviewed  result via mychart. RN left message  to increase  water intake .  Do not take Valsartan for 2 days and have lab draw- BMP in 1- 2 weeks  Per Dr Ellyn Hack instructions  Message left that labs can be done - anytime of the day  ,just come to the office lab.  Any question may call back

## 2022-02-28 ENCOUNTER — Other Ambulatory Visit: Payer: Self-pay

## 2022-02-28 DIAGNOSIS — I10 Essential (primary) hypertension: Secondary | ICD-10-CM

## 2022-02-28 DIAGNOSIS — I2510 Atherosclerotic heart disease of native coronary artery without angina pectoris: Secondary | ICD-10-CM

## 2022-02-28 DIAGNOSIS — R899 Unspecified abnormal finding in specimens from other organs, systems and tissues: Secondary | ICD-10-CM

## 2022-02-28 LAB — BASIC METABOLIC PANEL
BUN/Creatinine Ratio: 14 (ref 10–24)
BUN: 15 mg/dL (ref 8–27)
CO2: 26 mmol/L (ref 20–29)
Calcium: 9.7 mg/dL (ref 8.6–10.2)
Chloride: 104 mmol/L (ref 96–106)
Creatinine, Ser: 1.04 mg/dL (ref 0.76–1.27)
Glucose: 142 mg/dL — ABNORMAL HIGH (ref 70–99)
Potassium: 4.8 mmol/L (ref 3.5–5.2)
Sodium: 142 mmol/L (ref 134–144)
eGFR: 81 mL/min/{1.73_m2} (ref >59)

## 2022-03-02 ENCOUNTER — Encounter (HOSPITAL_COMMUNITY): Payer: Self-pay | Admitting: Internal Medicine

## 2022-03-02 ENCOUNTER — Emergency Department (HOSPITAL_COMMUNITY): Payer: 59

## 2022-03-02 ENCOUNTER — Other Ambulatory Visit: Payer: Self-pay

## 2022-03-02 ENCOUNTER — Inpatient Hospital Stay (HOSPITAL_COMMUNITY)
Admission: EM | Admit: 2022-03-02 | Discharge: 2022-03-03 | DRG: 065 | Disposition: A | Discharge status: Home / Self Care | Payer: 59 | Attending: Internal Medicine | Admitting: Internal Medicine

## 2022-03-02 ENCOUNTER — Inpatient Hospital Stay (HOSPITAL_COMMUNITY): Payer: 59

## 2022-03-02 DIAGNOSIS — Z955 Presence of coronary angioplasty implant and graft: Secondary | ICD-10-CM

## 2022-03-02 DIAGNOSIS — I110 Hypertensive heart disease with heart failure: Secondary | ICD-10-CM | POA: Diagnosis present

## 2022-03-02 DIAGNOSIS — I2510 Atherosclerotic heart disease of native coronary artery without angina pectoris: Secondary | ICD-10-CM | POA: Diagnosis present

## 2022-03-02 DIAGNOSIS — G8194 Hemiplegia, unspecified affecting left nondominant side: Secondary | ICD-10-CM | POA: Diagnosis present

## 2022-03-02 DIAGNOSIS — Z91018 Allergy to other foods: Secondary | ICD-10-CM | POA: Diagnosis not present

## 2022-03-02 DIAGNOSIS — I10 Essential (primary) hypertension: Secondary | ICD-10-CM | POA: Diagnosis present

## 2022-03-02 DIAGNOSIS — E785 Hyperlipidemia, unspecified: Secondary | ICD-10-CM | POA: Diagnosis present

## 2022-03-02 DIAGNOSIS — I5032 Chronic diastolic (congestive) heart failure: Secondary | ICD-10-CM | POA: Diagnosis present

## 2022-03-02 DIAGNOSIS — I639 Cerebral infarction, unspecified: Secondary | ICD-10-CM | POA: Diagnosis present

## 2022-03-02 DIAGNOSIS — E119 Type 2 diabetes mellitus without complications: Secondary | ICD-10-CM | POA: Diagnosis not present

## 2022-03-02 DIAGNOSIS — E1165 Type 2 diabetes mellitus with hyperglycemia: Secondary | ICD-10-CM | POA: Diagnosis present

## 2022-03-02 DIAGNOSIS — I6389 Other cerebral infarction: Secondary | ICD-10-CM | POA: Diagnosis not present

## 2022-03-02 DIAGNOSIS — Z7902 Long term (current) use of antithrombotics/antiplatelets: Secondary | ICD-10-CM | POA: Diagnosis not present

## 2022-03-02 DIAGNOSIS — Z79899 Other long term (current) drug therapy: Secondary | ICD-10-CM | POA: Diagnosis not present

## 2022-03-02 DIAGNOSIS — I252 Old myocardial infarction: Secondary | ICD-10-CM | POA: Diagnosis not present

## 2022-03-02 DIAGNOSIS — Z888 Allergy status to other drugs, medicaments and biological substances status: Secondary | ICD-10-CM | POA: Diagnosis not present

## 2022-03-02 DIAGNOSIS — Z8249 Family history of ischemic heart disease and other diseases of the circulatory system: Secondary | ICD-10-CM

## 2022-03-02 DIAGNOSIS — Z96642 Presence of left artificial hip joint: Secondary | ICD-10-CM | POA: Diagnosis present

## 2022-03-02 DIAGNOSIS — I63431 Cerebral infarction due to embolism of right posterior cerebral artery: Principal | ICD-10-CM | POA: Diagnosis present

## 2022-03-02 DIAGNOSIS — I63512 Cerebral infarction due to unspecified occlusion or stenosis of left middle cerebral artery: Secondary | ICD-10-CM | POA: Diagnosis present

## 2022-03-02 LAB — LIPID PANEL
Cholesterol: 101 mg/dL (ref 0–200)
HDL: 34 mg/dL — ABNORMAL LOW (ref >40)
LDL Cholesterol: 57 mg/dL (ref 0–99)
Total CHOL/HDL Ratio: 3 RATIO
Triglycerides: 48 mg/dL (ref <150)
VLDL: 10 mg/dL (ref 0–40)

## 2022-03-02 LAB — CBC
HCT: 41.3 % (ref 39.0–52.0)
Hemoglobin: 13.4 g/dL (ref 13.0–17.0)
MCH: 29.2 pg (ref 26.0–34.0)
MCHC: 32.4 g/dL (ref 30.0–36.0)
MCV: 90 fL (ref 80.0–100.0)
Platelets: 179 10*3/uL (ref 150–400)
RBC: 4.59 MIL/uL (ref 4.22–5.81)
RDW: 11.7 % (ref 11.5–15.5)
WBC: 7.2 10*3/uL (ref 4.0–10.5)
nRBC: 0 % (ref 0.0–0.2)

## 2022-03-02 LAB — COMPREHENSIVE METABOLIC PANEL
ALT: 27 U/L (ref 0–44)
AST: 28 U/L (ref 15–41)
Albumin: 4.4 g/dL (ref 3.5–5.0)
Alkaline Phosphatase: 60 U/L (ref 38–126)
Anion gap: 9 (ref 5–15)
BUN: 11 mg/dL (ref 8–23)
CO2: 23 mmol/L (ref 22–32)
Calcium: 9.5 mg/dL (ref 8.9–10.3)
Chloride: 106 mmol/L (ref 98–111)
Creatinine, Ser: 1.06 mg/dL (ref 0.61–1.24)
GFR, Estimated: >60 mL/min (ref >60)
Glucose, Bld: 180 mg/dL — ABNORMAL HIGH (ref 70–99)
Potassium: 3.9 mmol/L (ref 3.5–5.1)
Sodium: 138 mmol/L (ref 135–145)
Total Bilirubin: 1.5 mg/dL — ABNORMAL HIGH (ref 0.3–1.2)
Total Protein: 6.7 g/dL (ref 6.5–8.1)

## 2022-03-02 LAB — DIFFERENTIAL
Abs Immature Granulocytes: 0.02 10*3/uL (ref 0.00–0.07)
Basophils Absolute: 0 10*3/uL (ref 0.0–0.1)
Basophils Relative: 0 %
Eosinophils Absolute: 0 10*3/uL (ref 0.0–0.5)
Eosinophils Relative: 0 %
Immature Granulocytes: 0 %
Lymphocytes Relative: 15 %
Lymphs Abs: 1.1 10*3/uL (ref 0.7–4.0)
Monocytes Absolute: 0.4 10*3/uL (ref 0.1–1.0)
Monocytes Relative: 6 %
Neutro Abs: 5.6 10*3/uL (ref 1.7–7.7)
Neutrophils Relative %: 79 %

## 2022-03-02 LAB — I-STAT CHEM 8, ED
BUN: 11 mg/dL (ref 8–23)
Calcium, Ion: 1.22 mmol/L (ref 1.15–1.40)
Chloride: 105 mmol/L (ref 98–111)
Creatinine, Ser: 1 mg/dL (ref 0.61–1.24)
Glucose, Bld: 175 mg/dL — ABNORMAL HIGH (ref 70–99)
HCT: 43 % (ref 39.0–52.0)
Hemoglobin: 14.6 g/dL (ref 13.0–17.0)
Potassium: 3.9 mmol/L (ref 3.5–5.1)
Sodium: 140 mmol/L (ref 135–145)
TCO2: 23 mmol/L (ref 22–32)

## 2022-03-02 LAB — ETHANOL: Alcohol, Ethyl (B): <10 mg/dL (ref <10)

## 2022-03-02 LAB — PROTIME-INR
INR: 1.1 (ref 0.8–1.2)
Prothrombin Time: 13.6 seconds (ref 11.4–15.2)

## 2022-03-02 LAB — GLUCOSE, CAPILLARY
Glucose-Capillary: 164 mg/dL — ABNORMAL HIGH (ref 70–99)
Glucose-Capillary: 138 mg/dL — ABNORMAL HIGH (ref 70–99)

## 2022-03-02 LAB — ECHOCARDIOGRAM COMPLETE
Area-P 1/2: 3.42 cm2
S' Lateral: 3.3 cm

## 2022-03-02 LAB — HIV ANTIBODY (ROUTINE TESTING W REFLEX): HIV Screen 4th Generation wRfx: NONREACTIVE

## 2022-03-02 LAB — APTT: aPTT: 26 seconds (ref 24–36)

## 2022-03-02 LAB — CBG MONITORING, ED: Glucose-Capillary: 131 mg/dL — ABNORMAL HIGH (ref 70–99)

## 2022-03-02 MED ORDER — TICAGRELOR 90 MG PO TABS
90.0000 mg | ORAL_TABLET | Freq: Two times a day (BID) | ORAL | Status: DC
  Administered 2022-03-02 – 2022-03-03 (×2): 90 mg via ORAL
  Filled 2022-03-02 (×2): qty 1

## 2022-03-02 MED ORDER — SODIUM CHLORIDE 0.9 % IV SOLN: INTRAVENOUS | Status: DC

## 2022-03-02 MED ORDER — INSULIN ASPART 100 UNIT/ML IJ SOLN
0.0000 [IU] | Freq: Three times a day (TID) | INTRAMUSCULAR | Status: DC
  Administered 2022-03-02: 3 [IU] via SUBCUTANEOUS
  Administered 2022-03-02 – 2022-03-03 (×2): 1 [IU] via SUBCUTANEOUS
  Administered 2022-03-03: 2 [IU] via SUBCUTANEOUS

## 2022-03-02 MED ORDER — ACETAMINOPHEN 160 MG/5ML PO SOLN: 650.0000 mg | ORAL | Status: DC | PRN

## 2022-03-02 MED ORDER — SODIUM CHLORIDE 0.9% FLUSH
3.0000 mL | Freq: Once | INTRAVENOUS | Status: AC
  Administered 2022-03-02: 3 mL via INTRAVENOUS

## 2022-03-02 MED ORDER — ROSUVASTATIN CALCIUM 20 MG PO TABS
20.0000 mg | ORAL_TABLET | Freq: Every day | ORAL | Status: DC
  Administered 2022-03-02 – 2022-03-03 (×2): 20 mg via ORAL
  Filled 2022-03-02 (×2): qty 1

## 2022-03-02 MED ORDER — ACETAMINOPHEN 325 MG PO TABS
650.0000 mg | ORAL_TABLET | ORAL | Status: DC | PRN
  Administered 2022-03-03: 650 mg via ORAL
  Filled 2022-03-02: qty 2

## 2022-03-02 MED ORDER — ENOXAPARIN SODIUM 40 MG/0.4ML IJ SOSY
40.0000 mg | PREFILLED_SYRINGE | INTRAMUSCULAR | Status: DC
  Administered 2022-03-02: 40 mg via SUBCUTANEOUS
  Filled 2022-03-02: qty 0.4

## 2022-03-02 MED ORDER — STROKE: EARLY STAGES OF RECOVERY BOOK
Freq: Once | Status: AC
  Filled 2022-03-02: qty 1

## 2022-03-02 MED ORDER — IOHEXOL 350 MG/ML SOLN
100.0000 mL | Freq: Once | INTRAVENOUS | Status: AC | PRN
  Administered 2022-03-02: 100 mL via INTRAVENOUS

## 2022-03-02 MED ORDER — POLYVINYL ALCOHOL 1.4 % OP SOLN: 1.0000 [drp] | Freq: Every day | OPHTHALMIC | Status: DC | PRN

## 2022-03-02 MED ORDER — ACETAMINOPHEN 650 MG RE SUPP: 650.0000 mg | RECTAL | Status: DC | PRN

## 2022-03-02 MED ORDER — ASPIRIN 81 MG PO TBEC
81.0000 mg | DELAYED_RELEASE_TABLET | Freq: Every day | ORAL | Status: DC
  Administered 2022-03-02 – 2022-03-03 (×2): 81 mg via ORAL
  Filled 2022-03-02 (×2): qty 1

## 2022-03-02 NOTE — ED Notes (Signed)
Back to room, from MRI. MRI and vasc studies complete.

## 2022-03-02 NOTE — ED Notes (Signed)
Pt to vascular lab, then to MRI

## 2022-03-02 NOTE — Progress Notes (Signed)
  Echocardiogram 2D Echocardiogram has been performed.  Johny Chess 03/02/2022, 1:55 PM

## 2022-03-02 NOTE — Code Documentation (Signed)
Stroke Response Nurse Documentation Code Documentation  Zachary Flynn is a 61 y.o. male arriving to [facility]  via [employer] on 03-02-2022 with past medical hx of  HTN, Hyperlipidemia, CAD, Stent, . On Brilinta (ticagrelor) 90 mg bid. Code stroke was activated by ED.   Patient from Home where he was LKW yesterday.  He noted some left side weakness and numbness about 1930 last night.  He came to the hospital this morning because of continued numbness and dizziness  Stroke team at the bedside on patient arrival. Labs drawn and patient cleared for CT by Dr. Billy Fischer. Patient to CT with team. NIHSS 2, see documentation for details and code stroke times. Patient with left hemianopia and left decreased sensation on exam.  He was ataxic with prior assessment  in ED but currently has smooth movement of left arm and leg. The following imaging was completed:  CT Head, CTA, and CTP. Patient is not a candidate for IV Thrombolytic due to symptoms present yesterday at [facility]. Patient is not a candidate for IR.   Care Plan: neuro check & VS  q 2 hours.         MRI, stroke work up  Bedside handoff with ED RN Clifton James.    Raliegh Ip  Stroke Response RN

## 2022-03-02 NOTE — Plan of Care (Signed)

## 2022-03-02 NOTE — H&P (Addendum)
History and Physical    Patient: Zachary Flynn LPF:[id number] DOB: May 16, 1960 DOA: 03/02/2022 DOS: the patient was seen and examined on 03/02/2022 PCP: Lawerance Cruel, MD  Patient coming from: Home  Chief Complaint:  Chief Complaint  Patient presents with   Extremity Weakness   HPI: Zachary Flynn is a 61 y.o. male with medical history significant of hypertension, hyperlipidemia, diastolic CHF, CAD s/p PCI, diabetes mellitus type 2 not on medication who presents with complaints of left-sided weakness and numbness.  He had been in his normal state of health and was watched football game last night when he had dozed off.  When he woke up at around 7:30 PM he noticed that he was dizzy and had difficulty getting to bed.  He reported noticing some left-sided numbness on his face, arm, and leg.  Throughout the night he had a difficult time sleeping and noted that his left side seemed weaker than the right.  Noted associated symptoms of a mild right temporal headache.  He had tried touching his nose with his left and it was unable to, but noted no symptoms on the right side.  Had been on Brilinta, but was told that he did not need to take aspirin as well.  In the emergency department patient was seen as a code stroke.  Patient was noted to be tachypneic with blood pressure elevated 187/97, and all other vital signs maintained.  CT scan of the brain without contrast noted acute right PCA territory infarct with associated right PCA occlusion with a chronic appearing anterior division of the left MCA territory infarct with encephalomalacia.  CTA of the head and neck noted positive right PCA occlusion at the P2/P3 junction with no other large vessel occlusion.  Labs noted glucose 180 and total bilirubin 1.5.  Patient was not a candidate for thrombolytics due to being outside of the window.  He was not thought to be candidate for endovascular intervention as his stroke was thought to be  complete.    Review of Systems: As mentioned in the history of present illness. All other systems reviewed and are negative. Past Medical History:  Diagnosis Date   CAD (coronary artery disease) 12/02/2018   a. 100% proximal LAD insetting of anterior STEMI--PCI: Resolute DES 3.0 mm x 22 mm (postdilated 3.5-3.1 mm).  Also 50% proximal RI.  Left dominant system.  Normal EF by LV gram.   Cardiogenic shock ([place]) 12/2018   Mild shock complicating anterior STEMI; did not require mechanical support   Cervical disc disease    Chronic diastolic CHF (congestive heart failure), NYHA class 1 ([place]) 11/2018   a. LVEDP elevated at time of MI ->; GRII DD on follow-up echo   Erectile dysfunction    Essential hypertension 12/02/2018   Hyperlipidemia with target LDL less than 70 12/02/2018   Mild dilation of ascending aorta ([place])    a. by echo 12/2018.   Pre-diabetes    Past Surgical History:  Procedure Laterality Date   CERVICAL SPINE SURGERY  2012   Dr. Drema Pry ACUTE MI REVASCULARIZATION N/A 12/02/2018   Procedure: Coronary/Graft Acute MI Revascularization-CORONARY STENT INTERVENTION;  Surgeon: [name], MD;  Location: [facility];  Service: Cardiovascular;; p-mLAD 100% w/ 50% D1 ostial dz ( PCI - Resolute Onyx DES 3.0 mm x 32 mm --postdilated 3.5-3.1 mm).   LEFT HEART CATH AND CORONARY ANGIOGRAPHY N/A 12/02/2018   Procedure: LEFT HEART CATH AND CORONARY ANGIOGRAPHY;  Surgeon: [name]  W, MD;  Location: [facility]; INDICATION-ANTERIOR STEMI: p-mLAD 100% w/ 50% D1 ostial dz (DES PCI).  Moderate RI 55 %.  EF 55 to 60% (preliminary LV gram showed anterior hypokinesis, resolved following PCI).  Severely elevated LVEDP of 29 mmHg (acute diasto   REVISION TOTAL HIP ARTHROPLASTY Left 2003   TOTAL HIP ARTHROPLASTY Left 2000   Dr. Durward Fortes   TRANSTHORACIC ECHOCARDIOGRAM  12/02/2018   (In setting of anterior STEMI) EF 50-55%.  Apical septal and apical anterior  akinesis.  Moderate LVH.  GR 1 DD.  Normal LA and RA size.  No valvular disease.  Mildly dilated ascending aorta of 39 mm.   TRANSTHORACIC ECHOCARDIOGRAM  11/24/2019   EF 45 to 50%.  Mildly decreased function.  Hypokinesis of the mid-apical anteroseptal and apical wall.  GRII DD.  (Pseudonormalization).  Normal RV size and function.  Mildly dilated ascending aorta 43 mm.  Normal CVP.   Social History:  reports that he has never smoked. He has never used smokeless tobacco. He reports that he does not drink alcohol and does not use drugs.  Allergies  Allergen Reactions   Food     Lactose intolerant, no dairy products   Lisinopril Cough   Pollen Extract     Family History  Problem Relation Age of Onset   CAD Mother    Heart attack Mother    CAD Sister    Heart attack Sister    Colon cancer Neg Hx    Stomach cancer Neg Hx    Pancreatic cancer Neg Hx    Esophageal cancer Neg Hx     Prior to Admission medications   Medication Sig Start Date End Date Taking? Authorizing Provider  carvedilol (COREG) 6.25 MG tablet TAKE ONE TABLET BY MOUTH TWICE A DAY WITH MEALS 09/13/21   [name], MD  carvedilol (COREG) 6.25 MG tablet Take 1 tablet (6.25 mg total) by mouth 2 (two) times daily. 12/12/21   [name], MD  nitroGLYCERIN (NITROSTAT) 0.4 MG SL tablet Place 1 tablet (0.4 mg total) under the tongue every 5 (five) minutes as needed for chest pain. 06/29/20 06/29/21  [name], MD  rosuvastatin (CRESTOR) 20 MG tablet TAKE ONE TABLET BY MOUTH DAILY 01/08/22   [name], MD  sildenafil (REVATIO) 20 MG tablet Take 20 mg by mouth daily as needed for erectile dysfunction. 08/27/18   [provider]  ticagrelor (BRILINTA) 60 MG TABS tablet TAKE ONE TABLET BY MOUTH TWICE A DAY 02/07/22   [name], MD  valsartan (DIOVAN) 320 MG tablet TAKE ONE TABLET BY MOUTH DAILY 01/08/22   [name], MD    Physical Exam: Vitals:   03/02/22 0925 03/02/22 0930 03/02/22  0945 03/02/22 1000  BP: (!) 152/78 (!) 152/88 (!) 154/127 (!) 154/85  Pulse:  89 73 78  Resp: '16 17 17 15  '$ Temp:      SpO2:  100% 100% 99%     Constitutional: Older adult male currently no acute distress Eyes: PERRL, lids and conjunctivae normal ENMT: Mucous membranes are moist. Posterior pharynx clear of any exudate or lesions.  Neck: normal, supple,   Respiratory: clear to auscultation bilaterally, no wheezing, no crackles. Normal respiratory effort. No accessory muscle use.  Cardiovascular: Regular rate and rhythm, no murmurs / rubs / gallops. No extremity edema. 2+ pedal pulses. No carotid bruits.  Abdomen: no tenderness, no masses palpated.  Bowel sounds positive.  Musculoskeletal: no clubbing / cyanosis.  No joint deformity upper and lower extremities. Good ROM, no contractures. Normal muscle tone.  Skin: no rashes, lesions, ulcers. No induration Neurologic: CN 2-12 grossly intact.  Reported decreased sensation at the  left side of the face, arm, left lateral side of chest, and left leg.  Strength appears to be 5/5 in both upper and lower extremities Psychiatric: Normal judgment and insight. Alert and oriented x 3. Normal mood.   Data Reviewed:  EKG revealed normal sinus rhythm at 78 bpm with left anterior fascicular block and LVH.  Reviewed labs, imaging, and pertinent records as noted above  Assessment and Plan:  Cerebral infarction due to embolization of the right posterior cerebral artery. Acute.  Patient presented with complaints of left sided weakness and numbness starting last night around 7:30 PM.  CT/CTA of the head and neck noted acute right PCA territory infarct associated with right PCA occlusion.  Patient had not been a candidate for tPA due to being out of the window or endovascular intervention as stroke was thought to be complete.  Noted acute nonhemorrhagic infarct involving the medial right posterior left occipital lobe with 11 mm acute nonhemorrhagic infarct  infarct in the right thalamus and remote infarct of the anterior left frontal lobe.  Labs revealed LDL 57 and HDL 34. -Admit to a telemetry bed -Stroke order set utilized -Check echocardiogram -Aspirin added onto Brilinta(neurology increased dose from 60 mg twice daily to 90) -PT/OT/speech to evaluate and treat -Follow-up telemetry overnight -Appreciate neurology consultative services, we will follow-up for any further recommendations  Controlled diabetes mellitus type 2 with hyperglycemia, without long-term use of insulin On admission glucose 180.  Last hemoglobin A1c 6.8 on 12/19.  Patient is not on any medications for treatment currently. -Hypoglycemic protocols -CBGs before every meal with sensitive SSI -May benefit from being started on metformin in the outpatient setting.\  CAD Patient with prior history of stenting of the LAD in the setting of STEMI in 11/2018.  Patient denies any complaints of palpitations or chest pain. -Continue Brilinta, statin, aspirin  Essential hypertension Home blood pressure regimen includes Coreg 6.25 mg twice daily and valsartan 320 mg daily. -Allow for permissive hypertension and consider resuming when medically appropriate  Dyslipidemia LDL at goal at 57 and HDL 34. -Continue Crestor  DVT prophylaxis: Lovenox Advance Care Planning:   Code Status: Full Code   Consults: Neurology  Family Communication: Updated at bedside  Severity of Illness: The appropriate patient status for this patient is INPATIENT. Inpatient status is judged to be reasonable and necessary in order to provide the required intensity of service to ensure the patient's safety. The patient's presenting symptoms, physical exam findings, and initial radiographic and laboratory data in the context of their chronic comorbidities is felt to place them at high risk for further clinical deterioration. Furthermore, it is not anticipated that the patient will be medically stable for  discharge from the hospital within 2 midnights of admission.   * I certify that at the point of admission it is my clinical judgment that the patient will require inpatient hospital care spanning beyond 2 midnights from the point of admission due to high intensity of service, high risk for further deterioration and high frequency of surveillance required.*  Author: Norval Morton, MD 03/02/2022 10:41 AM  For on call review [email or web address].

## 2022-03-02 NOTE — Consult Note (Signed)
Stroke Neurology Consult Note   CC: dizziness  History is obtained from:patient  HPI: Zachary Flynn is a 61 y.o. male with a past medical history CAD, cardiogenic shock from anterior STEMI (on Brilinta), CHF, HTN, and HLD presenting with dizziness, left visual deficit, and left sensory deficit. He tells [place] that the dizziness started last night around 1930 while watching the football game.  He presented to the emergency room via private vehicle.  CT head was obtained.  Neurology consultation was obtained after the CT revealed concern for PCA infarction and probable PCA occlusion.  Attending spoke with the ED provider and recommended activation of code stroke since he was within the 24-hour window and may be candidate for intervention.  See discussions on advanced imaging and decision making below.   LKW: 1930 03/01/2022 IV thrombolysis given?: no, outside of the window Premorbid modified Rankin scale (mRS): 0  ROS: Full ROS was performed and is negative except as noted in the HPI.   Past Medical History:  Diagnosis Date   CAD (coronary artery disease) 12/02/2018   a. 100% proximal LAD insetting of anterior STEMI--PCI: Resolute DES 3.0 mm x 22 mm (postdilated 3.5-3.1 mm).  Also 50% proximal RI.  Left dominant system.  Normal EF by LV gram.   Cardiogenic shock ([place]) 12/2018   Mild shock complicating anterior STEMI; did not require mechanical support   Cervical disc disease    Chronic diastolic CHF (congestive heart failure), NYHA class 1 ([place]) 11/2018   a. LVEDP elevated at time of MI ->; GRII DD on follow-up echo   Erectile dysfunction    Essential hypertension 12/02/2018   Hyperlipidemia with target LDL less than 70 12/02/2018   Mild dilation of ascending aorta ([place])    a. by echo 12/2018.   Pre-diabetes     Family History  Problem Relation Age of Onset   CAD Mother    Heart attack Mother    CAD Sister    Heart attack Sister    Colon cancer Neg Hx    Stomach cancer Neg Hx     Pancreatic cancer Neg Hx    Esophageal cancer Neg Hx     Social History:   reports that he has never smoked. He has never used smokeless tobacco. He reports that he does not drink alcohol and does not use drugs.  Medications  Current Facility-Administered Medications:    sodium chloride flush (NS) 0.9 % injection 3 mL, 3 mL, Intravenous, Once, Gareth Morgan, MD  Current Outpatient Medications:    carvedilol (COREG) 6.25 MG tablet, TAKE ONE TABLET BY MOUTH TWICE A DAY WITH MEALS, Disp: 60 tablet, Rfl: 2   carvedilol (COREG) 6.25 MG tablet, Take 1 tablet (6.25 mg total) by mouth 2 (two) times daily., Disp: 60 tablet, Rfl: 3   nitroGLYCERIN (NITROSTAT) 0.4 MG SL tablet, Place 1 tablet (0.4 mg total) under the tongue every 5 (five) minutes as needed for chest pain., Disp: 25 tablet, Rfl: 6   rosuvastatin (CRESTOR) 20 MG tablet, TAKE ONE TABLET BY MOUTH DAILY, Disp: 30 tablet, Rfl: 3   sildenafil (REVATIO) 20 MG tablet, Take 20 mg by mouth daily as needed for erectile dysfunction., Disp: , Rfl:    ticagrelor (BRILINTA) 60 MG TABS tablet, TAKE ONE TABLET BY MOUTH TWICE A DAY, Disp: 60 tablet, Rfl: 3   valsartan (DIOVAN) 320 MG tablet, TAKE ONE TABLET BY MOUTH DAILY, Disp: 30 tablet, Rfl: 3   Exam: Current vital signs: BP (!) 187/97  Pulse 90   Temp 98.1 F (36.7 C)   Resp 18   SpO2 100%  Vital signs in last 24 hours: Temp:  [98.1 F (36.7 C)] 98.1 F (36.7 C) (12/30 0715) Pulse Rate:  [90] 90 (12/30 0715) Resp:  [18] 18 (12/30 0715) BP: (187)/(97) 187/97 (12/30 0715) SpO2:  [100 %] 100 % (12/30 0715)  GENERAL: Awake, alert in NAD HEENT: - Normocephalic and atraumatic, dry mm LUNGS - equal chest rise; unlabored. CV - S1S2 RRR, no m/r/g, equal pulses bilaterally. ABDOMEN - Soft, nontender, nondistended  Ext: warm, well perfused, intact peripheral pulses, no edema  NEURO:  Mental Status: AA&Ox3  Language: speech is clear.  Naming, repetition, fluency, and comprehension  intact. Cranial Nerves: PERRL,  EOMI, left upper quadrant hemiopsia, no facial asymmetry, facial sensation intact, hearing intact, tongue/uvula/soft palate midline. Motor: moves all extremities equally, non focal motor exam  Tone: is normal and bulk is normal Sensation- Diminished sensation on the left upper and lower extremity  Coordination: FTN intact bilaterally, no ataxia in BLE. Gait- deferred- not observed at this time.  NIHSS 1a Level of Conscious.: 0 1b LOC Questions: 0 1c LOC Commands: 0 2 Best Gaze: 0 3 Visual: 1 4 Facial Palsy: 0 5a Motor Arm - left: 0 5b Motor Arm - Right: 0 6a Motor Leg - Left: 0 6b Motor Leg - Right: 0 7 Limb Ataxia: 0 8 Sensory: 1 9 Best Language: 0 10 Dysarthria: 0 11 Extinct. and Inatten.: 0 TOTAL: 2   Labs I have reviewed labs in epic and the results pertinent to this consultation are:  CBC    Component Value Date/Time   WBC 7.2 03/02/2022 0726   RBC 4.59 03/02/2022 0726   HGB 14.6 03/02/2022 0747   HGB 13.5 02/11/2019 0821   HCT 43.0 03/02/2022 0747   HCT 42.0 02/11/2019 0821   PLT 179 03/02/2022 0726   PLT 175 02/11/2019 0821   MCV 90.0 03/02/2022 0726   MCV 87 02/11/2019 0821   MCH 29.2 03/02/2022 0726   MCHC 32.4 03/02/2022 0726   RDW 11.7 03/02/2022 0726   RDW 11.8 02/11/2019 0821   LYMPHSABS 1.1 03/02/2022 0726   MONOABS 0.4 03/02/2022 0726   EOSABS 0.0 03/02/2022 0726   BASOSABS 0.0 03/02/2022 0726    CMP     Component Value Date/Time   NA 140 03/02/2022 0747   NA 142 02/28/2022 0817   K 3.9 03/02/2022 0747   CL 105 03/02/2022 0747   CO2 23 03/02/2022 0726   GLUCOSE 175 (H) 03/02/2022 0747   BUN 11 03/02/2022 0747   BUN 15 02/28/2022 0817   CREATININE 1.00 03/02/2022 0747   CALCIUM 9.5 03/02/2022 0726   PROT 6.7 03/02/2022 0726   PROT 6.4 02/19/2022 0836   ALBUMIN 4.4 03/02/2022 0726   ALBUMIN 4.4 02/19/2022 0836   AST 28 03/02/2022 0726   ALT 27 03/02/2022 0726   ALKPHOS 60 03/02/2022 0726   BILITOT  1.5 (H) 03/02/2022 0726   BILITOT 1.2 02/19/2022 0836   GFRNONAA >60 03/02/2022 0726   GFRAA 87 07/05/2019 0938    Lipid Panel     Component Value Date/Time   CHOL 105 02/19/2022 0836   TRIG 49 02/19/2022 0836   HDL 35 (L) 02/19/2022 0836   CHOLHDL 3.0 02/19/2022 0836   CHOLHDL 4.5 12/02/2018 0126   VLDL 24 12/02/2018 0126   LDLCALC 58 02/19/2022 0836     Imaging I have reviewed the images obtained:  CT-head 1.  Acute Right PCA territory infarct, with evidence of associated Right PCA occlusion. No associated hemorrhage or mass effect. 2. Chronic appearing anterior division Left MCA territory infarct with encephalomalacia.  CT Angio Head and Neck with perfusion  Right P2/P3 occlusion  CT perfusion showed penumbra without core but that is believed to be pseudonormalization when putting together the CT findings which already showed established infarct.  CT perfusion also has not extremely reliable for posterior circulation  MRI examination of the brain pending  Assessment: 61 y.o. male with a past medical history CAD, Cardiogenic shock from anterior STEMI, CHF, HTN, and HLD presenting with dizziness, left visual deficit, and left sensory deficit. CTA head and neck shows a right P2/P3 occlusion. He was out of the window for TNK.  Deemed not a candidate for intervention due to the fact that even though the rapid maps showed a penumbra without core, because of the acute stroke seen and the CT scan, this was probably pseudonormalization as well as the fact that CT perfusion is not very reliable and posterior circulation strokes.  CT and CT perfusion likely indicates that this was a completed stroke.  This was discussed with the neuroradiologist and the intensivist personally.  Impression: Cerebral infarction due to embolism of right posterior cerebral artery  Recommendations: -Admit to hospitalist - HgbA1c, fasting lipid panel - MRI brain without contrast - Frequent neuro checks -  Echocardiogram - Continue Brilinta.  Add aspirin. - Risk factor modification - Telemetry monitoring - PT consult, OT consult, Speech consult - Stroke team to follow  Patient seen and examined by NP/APP with MD. MD to update note as needed.   [place]    Attending Neurohospitalist Addendum Patient seen and examined with APP/Resident. Agree with the history and physical as documented above. Agree with the plan as documented, which I helped formulate. I have independently reviewed the chart, obtained history, review of systems and examined the patient.I have personally reviewed pertinent head/neck/spine imaging (CT/MRI). please feel free to call with any questions.  -- Amie Portland, MD Neurologist [facility] Pager: [phone]

## 2022-03-02 NOTE — ED Triage Notes (Addendum)
Patient here with complaint of left sided weakness and numbness that he first noticed around 1930 yesterday. Patient's left arm and left leg drift but do not fall to wheelchair, has no dysarthria nor aphasia, no facial droop, sensation equal bilaterally on face, arms, and legs, no vision changes, is alert, oriented, and in no apparent distress at this time. Patient is in [place].

## 2022-03-02 NOTE — ED Provider Notes (Signed)
[facility] EMERGENCY DEPARTMENT Provider Note   CSN: [id number] Arrival date & time: 03/02/22  2482  An emergency department physician performed an initial assessment on this suspected stroke patient at 36.  History  Chief Complaint  Patient presents with   Extremity Weakness    Zachary Flynn is a 61 y.o. male.  HPI      61 year old male with history of coronary disease, chronic diastolic CHF, hypertension, hyperlipidemia, dilation of ascending aorta, presents with concern for dizziness and left-sided numbness beginning last night.  Reports that he began to feel a sensation of being off balance at 730 last night.  Woke up this morning and felt similar.  Came to the emergency department.  Denies weakness and history of myositis other.  Reports that initially he had left facial numbness that improved, has some left arm numbness that is going on.  Denies any known visual abnormalities.  Denies speech problems.  Had difficulty walking due to his dizziness.     Past Medical History:  Diagnosis Date   CAD (coronary artery disease) 12/02/2018   a. 100% proximal LAD insetting of anterior STEMI--PCI: Resolute DES 3.0 mm x 22 mm (postdilated 3.5-3.1 mm).  Also 50% proximal RI.  Left dominant system.  Normal EF by LV gram.   Cardiogenic shock ([place]) 12/2018   Mild shock complicating anterior STEMI; did not require mechanical support   Cervical disc disease    Chronic diastolic CHF (congestive heart failure), NYHA class 1 ([place]) 11/2018   a. LVEDP elevated at time of MI ->; GRII DD on follow-up echo   Erectile dysfunction    Essential hypertension 12/02/2018   Hyperlipidemia with target LDL less than 70 12/02/2018   Mild dilation of ascending aorta ([place])    a. by echo 12/2018.   Pre-diabetes     Home Medications Prior to Admission medications   Medication Sig Start Date End Date Taking? Authorizing Provider  carvedilol (COREG) 6.25 MG tablet TAKE ONE TABLET BY  MOUTH TWICE A DAY WITH MEALS Patient taking differently: Take 6.25 mg by mouth 2 (two) times daily with a meal. TAKE ONE TABLET BY MOUTH TWICE A DAY WITH MEALS 09/13/21  Yes [name], MD  nitroGLYCERIN (NITROSTAT) 0.4 MG SL tablet Place 1 tablet (0.4 mg total) under the tongue every 5 (five) minutes as needed for chest pain. 06/29/20 03/02/22 Yes [name], MD  Propylene Glycol (SYSTANE BALANCE OP) Place 1 drop into both eyes daily as needed (dry eyes).   Yes [provider]  rosuvastatin (CRESTOR) 20 MG tablet TAKE ONE TABLET BY MOUTH DAILY Patient taking differently: Take 20 mg by mouth daily. 01/08/22  Yes [name], MD  saw palmetto 500 MG capsule Take 500 mg by mouth daily.   Yes [provider]  sildenafil (REVATIO) 20 MG tablet Take 20 mg by mouth daily as needed for erectile dysfunction. 08/27/18  Yes [provider]  ticagrelor (BRILINTA) 60 MG TABS tablet TAKE ONE TABLET BY MOUTH TWICE A DAY Patient taking differently: Take 60 mg by mouth 2 (two) times daily. 02/07/22  Yes [name], MD  valsartan (DIOVAN) 320 MG tablet TAKE ONE TABLET BY MOUTH DAILY 01/08/22  Yes [name], MD      Allergies    Food, Lisinopril, and Pollen extract    Review of Systems   Review of Systems  Physical Exam Updated Vital Signs BP (!) 140/90 (BP Location: Right Arm)   Pulse  64   Temp 99.1 F (37.3 C) (Oral)   Resp 13   SpO2 99%  Physical Exam Vitals and nursing note reviewed.  Constitutional:      General: He is not in acute distress.    Appearance: Normal appearance. He is well-developed. He is not ill-appearing or diaphoretic.  HENT:     Head: Normocephalic and atraumatic.  Eyes:     General: No visual field deficit.    Extraocular Movements: Extraocular movements intact.     Conjunctiva/sclera: Conjunctivae normal.     Pupils: Pupils are equal, round, and reactive to light.  Cardiovascular:     Rate and Rhythm: Normal rate and  regular rhythm.     Pulses: Normal pulses.     Heart sounds: Normal heart sounds. No murmur heard.    No friction rub. No gallop.  Pulmonary:     Effort: Pulmonary effort is normal. No respiratory distress.     Breath sounds: Normal breath sounds. No wheezing or rales.  Abdominal:     General: There is no distension.     Palpations: Abdomen is soft.     Tenderness: There is no abdominal tenderness. There is no guarding.  Musculoskeletal:        General: No swelling or tenderness.     Cervical back: Normal range of motion.  Skin:    General: Skin is warm and dry.     Findings: No erythema or rash.  Neurological:     General: No focal deficit present.     Mental Status: He is alert and oriented to person, place, and time.     GCS: GCS eye subscore is 4. GCS verbal subscore is 5. GCS motor subscore is 6.     Cranial Nerves: No cranial nerve deficit, dysarthria or facial asymmetry.     Sensory: Sensory deficit (left) present.     Motor: Weakness (very mild pronation on left) present. No tremor.     Coordination: Coordination normal. Finger-Nose-Finger Test normal.     Gait: Gait normal.     Comments: Left upper visual field abnormality, does not appear present right eye     ED Results / Procedures / Treatments   Labs (all labs ordered are listed, but only abnormal results are displayed) Labs Reviewed  COMPREHENSIVE METABOLIC PANEL - Abnormal; Notable for the following components:      Result Value   Glucose, Bld 180 (*)    Total Bilirubin 1.5 (*)    All other components within normal limits  LIPID PANEL - Abnormal; Notable for the following components:   HDL 34 (*)    All other components within normal limits  GLUCOSE, CAPILLARY - Abnormal; Notable for the following components:   Glucose-Capillary 164 (*)    All other components within normal limits  GLUCOSE, CAPILLARY - Abnormal; Notable for the following components:   Glucose-Capillary 138 (*)    All other components  within normal limits  I-STAT CHEM 8, ED - Abnormal; Notable for the following components:   Glucose, Bld 175 (*)    All other components within normal limits  CBG MONITORING, ED - Abnormal; Notable for the following components:   Glucose-Capillary 131 (*)    All other components within normal limits  PROTIME-INR  APTT  CBC  DIFFERENTIAL  ETHANOL  HIV ANTIBODY (ROUTINE TESTING W REFLEX)  CBC  COMPREHENSIVE METABOLIC PANEL    EKG EKG Interpretation  Date/Time:  Saturday March 02 2022 07:19:54 EST Ventricular Rate:  78 PR  Interval:  142 QRS Duration: 94 QT Interval:  346 QTC Calculation: 394 R Axis:   -70 Text Interpretation: Normal sinus rhythm Left anterior fascicular block Left ventricular hypertrophy with repolarization abnormality ( R in aVL , Cornell product , Romhilt-Estes ) Cannot rule out Septal infarct , age undetermined Abnormal ECG When compared with ECG of 02-Dec-2018 07:02, Less prominent STE anteriorly than prior Confirmed by Gareth Morgan [phone]) on 03/02/2022 8:32:47 AM  Radiology MR BRAIN WO CONTRAST  Result Date: 03/02/2022 CLINICAL DATA:  Right PCA territory infarct.  Stroke, follow-up. EXAM: MRI HEAD WITHOUT CONTRAST TECHNIQUE: Multiplanar, multiecho pulse sequences of the brain and surrounding structures were obtained without intravenous contrast. COMPARISON:  CT head and CTA head and neck 03/02/2022. FINDINGS: Brain: The diffusion-weighted images confirm acute nonhemorrhagic infarct involving the medial right temporal and occipital lobe. 11 mm acute nonhemorrhagic infarct is present within the right thalamus. Both areas demonstrate increased T2 and FLAIR signal hyperintensity. A remote infarct involving the anterior left frontal lobe is noted. No significant white matter disease is present apart from the infarcts. The ventricles are of normal size. Deep brain nuclei are within normal limits. No significant extraaxial fluid collection is present. The  internal auditory canals are within normal limits. The brainstem and cerebellum are within normal limits. Vascular: Flow is present in the major intracranial arteries. Skull and upper cervical spine: The craniocervical junction is normal. Upper cervical spine is within normal limits. Marrow signal is unremarkable. Sinuses/Orbits: The paranasal sinuses and mastoid air cells are clear. The globes and orbits are within normal limits. IMPRESSION: 1. Acute nonhemorrhagic infarct involving the medial right temporal and occipital lobe. 2. 11 mm acute nonhemorrhagic infarct involving the right thalamus. 3. Remote infarct of the anterior left frontal lobe. These results were called by telephone at the time of interpretation on 03/02/2022 at 2:50 pm to provider Dr. Rory Percy, who verbally acknowledged these results. Electronically Signed   By: San Morelle M.D.   On: 03/02/2022 14:52   ECHOCARDIOGRAM COMPLETE  Result Date: 03/02/2022    ECHOCARDIOGRAM REPORT   Patient Name:   Zachary Flynn Date of Exam: 03/02/2022 Medical Rec #:  [id number]       Height:       68.0 in Accession #:    [id number]      Weight:       181.8 lb Date of Birth:  12-Jul-1960       BSA:          1.963 m Patient Age:    44 years        BP:           137/85 mmHg Patient Gender: M               HR:           70 bpm. Exam Location:  Inpatient Procedure: 2D Echo Indications:    stroke  History:        Patient has prior history of Echocardiogram examinations, most                 recent 11/24/2019. CAD; Risk Factors:Hypertension and                 Dyslipidemia.  Sonographer:    Johny Chess RDCS Referring Phys: [phone] RONDELL A SMITH IMPRESSIONS  1. Left ventricular ejection fraction, by estimation, is 50 to 55%. The left ventricle has low normal function. The left ventricle demonstrates regional wall motion abnormalities (see scoring diagram/findings  for description). There is mild concentric left ventricular hypertrophy. Left ventricular  diastolic parameters are consistent with Grade II diastolic dysfunction (pseudonormalization). Elevated left atrial pressure.  2. Right ventricular systolic function is normal. The right ventricular size is normal.  3. Left atrial size was mildly dilated.  4. The mitral valve is normal in structure. No evidence of mitral valve regurgitation.  5. The aortic valve is tricuspid. Aortic valve regurgitation is trivial.  6. There is borderline dilatation of the ascending aorta, measuring 39 mm. Comparison(s): No significant change from prior study. Prior images reviewed side by side. FINDINGS  Left Ventricle: Left ventricular ejection fraction, by estimation, is 50 to 55%. The left ventricle has low normal function. The left ventricle demonstrates regional wall motion abnormalities. The left ventricular internal cavity size was normal in size. There is mild concentric left ventricular hypertrophy. Left ventricular diastolic parameters are consistent with Grade II diastolic dysfunction (pseudonormalization). Elevated left atrial pressure.  LV Wall Scoring: The apical septal segment, apical anterior segment, apical inferior segment, and apex are hypokinetic. Right Ventricle: The right ventricular size is normal. No increase in right ventricular wall thickness. Right ventricular systolic function is normal. Left Atrium: Left atrial size was mildly dilated. Right Atrium: Right atrial size was normal in size. Pericardium: There is no evidence of pericardial effusion. Mitral Valve: The mitral valve is normal in structure. No evidence of mitral valve regurgitation. Tricuspid Valve: The tricuspid valve is normal in structure. Tricuspid valve regurgitation is not demonstrated. Aortic Valve: The aortic valve is tricuspid. Aortic valve regurgitation is trivial. Pulmonic Valve: The pulmonic valve was normal in structure. Pulmonic valve regurgitation is not visualized. Aorta: The aortic root is normal in size and structure. There is  borderline dilatation of the ascending aorta, measuring 39 mm. IAS/Shunts: No atrial level shunt detected by color flow Doppler.  LEFT VENTRICLE PLAX 2D LVIDd:         4.40 cm   Diastology LVIDs:         3.30 cm   LV e' medial:    6.64 cm/s LV PW:         1.30 cm   LV E/e' medial:  14.1 LV IVS:        1.10 cm   LV e' lateral:   7.72 cm/s LVOT diam:     2.00 cm   LV E/e' lateral: 12.1 LV SV:         71 LV SV Index:   36 LVOT Area:     3.14 cm  RIGHT VENTRICLE             IVC RV S prime:     13.20 cm/s  IVC diam: 1.70 cm TAPSE (M-mode): 2.2 cm LEFT ATRIUM             Index        RIGHT ATRIUM           Index LA diam:        3.60 cm 1.83 cm/m   RA Area:     13.60 cm LA Vol (A2C):   75.9 ml 38.67 ml/m  RA Volume:   30.80 ml  15.69 ml/m LA Vol (A4C):   56.7 ml 28.89 ml/m LA Biplane Vol: 68.4 ml 34.85 ml/m  AORTIC VALVE LVOT Vmax:   112.00 cm/s LVOT Vmean:  76.200 cm/s LVOT VTI:    0.226 m  AORTA Ao Root diam: 3.60 cm Ao Asc diam:  3.90 cm MITRAL VALVE MV Area (PHT):  3.42 cm    SHUNTS MV Decel Time: 222 msec    Systemic VTI:  0.23 m MV E velocity: 93.60 cm/s  Systemic Diam: 2.00 cm MV A velocity: 70.80 cm/s MV E/A ratio:  1.32 Mihai Croitoru MD Electronically signed by Sanda Klein MD Signature Date/Time: 03/02/2022/2:44:18 PM    Final    CT ANGIO HEAD NECK W WO CM W PERF (CODE STROKE)  Result Date: 03/02/2022 CLINICAL DATA:  60 year old male with neurologic deficit beginning 1930 hours yesterday and evidence of evolving right PCA territory infarct on plain CT. EXAM: CT ANGIOGRAPHY HEAD AND NECK CT PERFUSION BRAIN TECHNIQUE: Multidetector CT imaging of the head and neck was performed using the standard protocol during bolus administration of intravenous contrast. Multiplanar CT image reconstructions and MIPs were obtained to evaluate the vascular anatomy. Carotid stenosis measurements (when applicable) are obtained utilizing NASCET criteria, using the distal internal carotid diameter as the denominator.  Multiphase CT imaging of the brain was performed following IV bolus contrast injection. Subsequent parametric perfusion maps were calculated using RAPID software. RADIATION DOSE REDUCTION: This exam was performed according to the departmental dose-optimization program which includes automated exposure control, adjustment of the mA and/or kV according to patient size and/or use of iterative reconstruction technique. CONTRAST:  100 mL Omnipaque 350 COMPARISON:  Plain head CTs today 0821 and 0906 hours. CTA chest 02/05/2021. FINDINGS: CT Brain Perfusion Findings: ASPECTS: Not applicable, right PCA cytotoxic edema. CBF (<30%) Volume: 22m. No abnormal CBF or CBV parameter. Perfusion (Tmax>6.0s) volume: 16 mL, corresponding to the right PCA territory cytotoxic edema. Mismatch Volume: Not applicable Infarction Location:Right PCA CTA NECK Skeleton: Status post C4-C5 cervical disc arthroplasty and superimposed C5-C6 and C6-C7 ACDF. Evidence of chronic pseudoarthrosis at the latter. No acute osseous abnormality identified. Upper chest: Mild dependent atelectasis. Evidence of chronic increased right paratracheal and mediastinal lymph node tissue which seems progressed from 1 year ago (series 5, image 174) but has a nonspecific appearance. Other neck: No acute neck soft tissue finding identified. Aortic arch: 3 vessel arch configuration without atherosclerosis. Right carotid system: Negative brachiocephalic artery and right CCA origin. Mild soft plaque proximal to the bifurcation. Relatively mild soft and calcified plaque at the right ICA origin and bulb with no stenosis. Left carotid system: Minimal soft plaque in the left CCA before the bifurcation. Minimal soft and calcified plaque at the proximal left ICA without stenosis. Vertebral arteries: Proximal right subclavian artery and right vertebral artery origin are patent with mild calcified plaque but no significant stenosis. Slightly non dominant right vertebral artery.  Obscured at the level of the C4-C5 disc arthroplasty, but otherwise patent to the skull base without stenosis. Minimal soft plaque in the proximal left subclavian artery without stenosis. Normal left vertebral artery origin. Mildly dominant left vertebral artery is patent to the skull base, obscured at the C4-C5 disc arthroplasty level. No stenosis. CTA HEAD Posterior circulation: Dominant left V4 segment. No distal vertebral or vertebrobasilar junction plaque or stenosis. Patent basilar artery without stenosis. Patent basilar tip, SCA and PCA origins. Mild left PCA irregularity most pronounced in the P1 segment. No significant stenosis. Left PCA branches within normal limits. Right PCA P1 and proximal P2 segment are normal but there is occlusion at the P2/P3 junction on series 13, image 18. Some distal reconstitution. Posterior communicating arteries are diminutive or absent. Anterior circulation: Both ICA siphons are patent. Mild left siphon tortuosity and calcified plaque without stenosis. Mild right siphon tortuosity and calcified plaque without stenosis. There is  a small right posterior communicating artery with a small infundibulum on series 10, image 92 (normal variant). Patent carotid termini, MCA and ACA origins. Anterior communicating artery and bilateral ACA branches are within normal limits. Left MCA M1 segment and left MCA bifurcation are patent. Left MCA branches are within normal limits. Right MCA M1 segment and bifurcation are patent without stenosis. Right MCA branches are within normal limits. Venous sinuses: Early contrast timing, superior sagittal sinus is patent. Anatomic variants: Left vertebral artery is mildly dominant throughout. Review of the MIP images confirms the above findings IMPRESSION: 1. Positive for Right PCA occlusion at the P2/P3 junction. Associated Right PCA territory infarct visible by plain CT is only detected via T-max on CTP (CBF and CBV probably pseudo-normalized). 2. No  other large vessel occlusion, and mild for age atherosclerosis in the head and neck with no other significant arterial stenosis. 3. Nonspecific lymphadenopathy in the visible superior mediastinum, chronic but progressed from last year. Dr. Rory Percy advises this patient might carried diagnosis of Sarcoidosis. Consider referral to [facility]). 4. Status post C4-C5 cervical disc arthroplasty and C5-C6 and C6-C7 ACDF, evidence of chronic pseudoarthrosis at the latter. Salient findings were discussed by telephone with Dr. Rory Percy on 03/02/2022 at 09:31 . Electronically Signed   By: Genevie Ann M.D.   On: 03/02/2022 09:34   CT HEAD CODE STROKE WO CONTRAST`  Result Date: 03/02/2022 CLINICAL DATA:  Code stroke. 61 year old male with neurologic deficit and acute right PCA territory infarct on head CT this morning. EXAM: CT HEAD WITHOUT CONTRAST TECHNIQUE: Contiguous axial images were obtained from the base of the skull through the vertex without intravenous contrast. RADIATION DOSE REDUCTION: This exam was performed according to the departmental dose-optimization program which includes automated exposure control, adjustment of the mA and/or kV according to patient size and/or use of iterative reconstruction technique. COMPARISON:  0821 hours today. FINDINGS: Brain: Stable non contrast CT appearance of the brain. No acute intracranial hemorrhage identified. No midline shift, mass effect, or evidence of intracranial mass lesion. Vascular: Subtle right PCA hyperdensity as detailed earlier. No new suspicious vascular hyperdensity. Skull: Stable. Sinuses/Orbits: Stable. Other: Stable. ASPECTS [facility] Stroke Program Early CT Score) - Ganglionic level infarction (caudate, lentiform nuclei, internal capsule, insula, M1-M3 cortex): - Supraganglionic infarction (M4-M6 cortex): Total score (0-10 with 10 being normal): IMPRESSION: Stable from 45 minutes earlier. Acute appearing Right PCA territory  infarct with no hemorrhagic transformation or mass effect. CTA is pending. These results were communicated to Dr. Rory Percy at 9:12 am on 03/02/2022 by text page via the [facility] messaging system. Electronically Signed   By: Genevie Ann M.D.   On: 03/02/2022 09:12   CT HEAD WO CONTRAST  Addendum Date: 03/02/2022   ADDENDUM REPORT: 03/02/2022 08:47 ADDENDUM: Study discussed by telephone with Dr. Gareth Morgan on 03/02/2022 at 0835 hours. Electronically Signed   By: Genevie Ann M.D.   On: 03/02/2022 08:47   Result Date: 03/02/2022 CLINICAL DATA:  61 year old male with left side numbness and weakness since 1930 hours yesterday. EXAM: CT HEAD WITHOUT CONTRAST TECHNIQUE: Contiguous axial images were obtained from the base of the skull through the vertex without intravenous contrast. RADIATION DOSE REDUCTION: This exam was performed according to the departmental dose-optimization program which includes automated exposure control, adjustment of the mA and/or kV according to patient size and/or use of iterative reconstruction technique. COMPARISON:  None Available. FINDINGS: Brain: Chronic encephalomalacia left anterior insula and inferior frontal gyrus. Somewhat generalized cerebral volume loss  over that expected for age. No midline shift, mass effect, or evidence of intracranial mass lesion. No ventriculomegaly. No acute intracranial hemorrhage identified. Right PCA territory asymmetric gray and white matter hypodensity compatible with cytotoxic edema (series 3, image 15 and coronal image 45). No associated hemorrhage or mass effect. No other acute cortically based infarct identified. Deep gray nuclei, brainstem, and cerebellum appear negative. Vascular: Calcified atherosclerosis at the skull base. Subtle right PCA hyperdensity visible in the right ambient cistern also series 3, image 13. Skull: No acute osseous abnormality identified. Sinuses/Orbits: Visualized paranasal sinuses and mastoids are clear. Other: Visualized  orbits and scalp soft tissues are within normal limits. IMPRESSION: 1. Acute Right PCA territory infarct, with evidence of associated Right PCA occlusion. No associated hemorrhage or mass effect. 2. Chronic appearing anterior division Left MCA territory infarct with encephalomalacia. 3. No other acute intracranial abnormality identified. Electronically Signed: By: Genevie Ann M.D. On: 03/02/2022 08:29    Procedures .Critical Care  Performed by: Gareth Morgan, MD Authorized by: Gareth Morgan, MD   Critical care provider statement:    Critical care time (minutes):  30   Critical care was time spent personally by me on the following activities:  Development of treatment plan with patient or surrogate, discussions with consultants, examination of patient, ordering and review of laboratory studies, ordering and review of radiographic studies, ordering and performing treatments and interventions, pulse oximetry and review of old charts     Medications Ordered in ED Medications   stroke: early stages of recovery book (has no administration in time range)  0.9 %  sodium chloride infusion ( Intravenous Infusion Verify 03/02/22 2000)  acetaminophen (TYLENOL) tablet 650 mg (has no administration in time range)    Or  acetaminophen (TYLENOL) 160 MG/5ML solution 650 mg (has no administration in time range)    Or  acetaminophen (TYLENOL) suppository 650 mg (has no administration in time range)  enoxaparin (LOVENOX) injection 40 mg (40 mg Subcutaneous Given 03/02/22 2252)  insulin aspart (novoLOG) injection 0-9 Units (3 Units Subcutaneous Given 03/02/22 1746)  aspirin EC tablet 81 mg (81 mg Oral Given 03/02/22 1628)  ticagrelor (BRILINTA) tablet 90 mg (90 mg Oral Given 03/02/22 1628)  rosuvastatin (CRESTOR) tablet 20 mg (20 mg Oral Given 03/02/22 1829)  polyvinyl alcohol (LIQUIFILM TEARS) 1.4 % ophthalmic solution 1 drop (has no administration in time range)  sodium chloride flush (NS) 0.9 % injection  3 mL (3 mLs Intravenous Given 03/02/22 1220)  iohexol (OMNIPAQUE) 350 MG/ML injection 100 mL (100 mLs Intravenous Contrast Given 03/02/22 5102)    ED Course/ Medical Decision Making/ A&P                           Medical Decision Making Amount and/or Complexity of Data Reviewed Labs: ordered. Radiology: ordered.  Risk Prescription drug management. Decision regarding hospitalization.   61 year old male with history of coronary disease, chronic diastolic CHF, hypertension, hyperlipidemia, dilation of ascending aorta, presents with concern for dizziness and left-sided numbness beginning last night.  Was not clearly VAN positive on arrival to ED and was out of TNK window so code stroke was not called in triage.  CT head was completed and personally evaluated interpreted by me and radiology and showed a concern for PCA infarction and probable PCA occlusion.  Discussed CT findings emergently with Dr. Rory Percy neurology, given patient is within the window for possible endovascular intervention, called a code stroke for CT angio and perfusion  study.  This is completed showing P2 3 abnormality, determined not to be an intervention candidate.  Plan to admit to the hospital for continued stroke care.  Completed and personally interpreted by me show no significant electrolyte abnormalities, no significant anemia or leukocytosis.       Final Clinical Impression(s) / ED Diagnoses Final diagnoses:  Cerebrovascular accident (CVA), unspecified mechanism ([place])    Rx / DC Orders ED Discharge Orders     None         Gareth Morgan, MD 03/02/22 2351

## 2022-03-03 ENCOUNTER — Other Ambulatory Visit: Payer: Self-pay | Admitting: Cardiology

## 2022-03-03 DIAGNOSIS — I639 Cerebral infarction, unspecified: Secondary | ICD-10-CM

## 2022-03-03 DIAGNOSIS — I2510 Atherosclerotic heart disease of native coronary artery without angina pectoris: Secondary | ICD-10-CM | POA: Diagnosis not present

## 2022-03-03 DIAGNOSIS — I10 Essential (primary) hypertension: Secondary | ICD-10-CM | POA: Diagnosis not present

## 2022-03-03 DIAGNOSIS — I63431 Cerebral infarction due to embolism of right posterior cerebral artery: Secondary | ICD-10-CM | POA: Diagnosis not present

## 2022-03-03 DIAGNOSIS — E119 Type 2 diabetes mellitus without complications: Secondary | ICD-10-CM | POA: Diagnosis not present

## 2022-03-03 LAB — COMPREHENSIVE METABOLIC PANEL
ALT: 20 U/L (ref 0–44)
AST: 20 U/L (ref 15–41)
Albumin: 3.6 g/dL (ref 3.5–5.0)
Alkaline Phosphatase: 58 U/L (ref 38–126)
Anion gap: 11 (ref 5–15)
BUN: 8 mg/dL (ref 8–23)
CO2: 22 mmol/L (ref 22–32)
Calcium: 8.7 mg/dL — ABNORMAL LOW (ref 8.9–10.3)
Chloride: 107 mmol/L (ref 98–111)
Creatinine, Ser: 0.99 mg/dL (ref 0.61–1.24)
GFR, Estimated: >60 mL/min (ref >60)
Glucose, Bld: 136 mg/dL — ABNORMAL HIGH (ref 70–99)
Potassium: 3.5 mmol/L (ref 3.5–5.1)
Sodium: 140 mmol/L (ref 135–145)
Total Bilirubin: 1.5 mg/dL — ABNORMAL HIGH (ref 0.3–1.2)
Total Protein: 5.7 g/dL — ABNORMAL LOW (ref 6.5–8.1)

## 2022-03-03 LAB — CBC
HCT: 37.7 % — ABNORMAL LOW (ref 39.0–52.0)
Hemoglobin: 11.9 g/dL — ABNORMAL LOW (ref 13.0–17.0)
MCH: 28.5 pg (ref 26.0–34.0)
MCHC: 31.6 g/dL (ref 30.0–36.0)
MCV: 90.4 fL (ref 80.0–100.0)
Platelets: 161 10*3/uL (ref 150–400)
RBC: 4.17 MIL/uL — ABNORMAL LOW (ref 4.22–5.81)
RDW: 11.9 % (ref 11.5–15.5)
WBC: 5.5 10*3/uL (ref 4.0–10.5)
nRBC: 0 % (ref 0.0–0.2)

## 2022-03-03 LAB — GLUCOSE, CAPILLARY
Glucose-Capillary: 132 mg/dL — ABNORMAL HIGH (ref 70–99)
Glucose-Capillary: 160 mg/dL — ABNORMAL HIGH (ref 70–99)
Glucose-Capillary: 150 mg/dL — ABNORMAL HIGH (ref 70–99)
Glucose-Capillary: 183 mg/dL — ABNORMAL HIGH (ref 70–99)

## 2022-03-03 MED ORDER — ASPIRIN 81 MG PO TBEC: 81.0000 mg | DELAYED_RELEASE_TABLET | Freq: Every day | ORAL | 0 refills | Status: AC

## 2022-03-03 NOTE — Progress Notes (Signed)
Pt discharged with wife to home   03/03/22 1620  AVS Discharge Documentation  AVS Discharge Instructions Including Medications Provided to patient/caregiver  Name of Person Receiving AVS Discharge Instructions Including Medications Zachary Flynn  Name of Clinician That Reviewed AVS Discharge Instructions Including Medications Efrain Sella RN

## 2022-03-03 NOTE — Plan of Care (Signed)
  Problem: Education: Goal: Knowledge of disease or condition will improve Outcome: Completed/Met Goal: Knowledge of secondary prevention will improve (MUST DOCUMENT ALL) Outcome: Completed/Met Goal: Knowledge of patient specific risk factors will improve [place] N/A or DELETE if not current risk factor) Outcome: Completed/Met

## 2022-03-03 NOTE — Discharge Summary (Signed)
Physician Discharge Summary  Zachary Flynn DQQ:[id number] DOB: 1960-07-12 DOA: 03/02/2022  PCP: Lawerance Cruel, MD  Admit date: 03/02/2022 Discharge date: 03/03/2022  Admitted From: Home Disposition:  Home  Recommendations for Outpatient Follow-up:  Follow up with PCP in 1-2 weeks Follow up with neuro as scheduled  Home Health: Outpatient PT/OT  Equipment/Devices:No new equipment  Discharge Condition:Stable  CODE STATUS:Full  Diet recommendation:  Low salt, low fat, low carb diet  Brief/Interim Summary: Zachary Flynn is a 61 y.o. male with medical history significant of hypertension, hyperlipidemia, diastolic CHF, CAD s/p PCI, diabetes mellitus type 2 not on medication who presents with complaints of left-sided weakness and numbness.   Patient admitted as above with acute onset left-sided numbness with partial weakness.  MRI confirms cerebral infarcts, consistent with embolization of the right posterior cerebral artery.  He is numbness and strength appear to be resolving, not yet back to baseline but markedly improved -he states he is 90% back to normal.  Patient was not a candidate for intervention due to his presentation window.  Neurology following, we appreciate their insight recommendations.  Given findings on imaging, negative echo recommendations are to continue Brilinta and aspirin for 90 days then continue on Brilinta alone per previous medication list.  Patient evaluated by therapy recommending further outpatient rehabilitation, otherwise no further indications for hospitalization, his chronic comorbid conditions including diabetes CAD hypertension and dyslipidemia appear to be somewhat well-controlled.  Patient was allowed permissive hypertension in the hospital, will resume his home medications tomorrow at home, recommended at minimum daily blood pressures in the morning if not up to 3 times per day per discussion with patient and wife.  He has follow-up with PCP in just a  few days to be reevaluated to ensure his blood pressure remains well-controlled.   Discharge Diagnoses:  Principal Problem:   CVA (cerebral vascular accident) [facility]) Active Problems:   Controlled type 2 diabetes mellitus without complication, without long-term current use of insulin ([place])   Coronary artery disease involving native coronary artery of native heart without angina pectoris   Essential hypertension   Dyslipidemia  Cerebral infarction due to embolization of the right posterior cerebral artery. -As above outside window for intervention, continue core measures -Echo unremarkable -Continue low-salt low-fat low-carb diet, Brilinta/aspirin for 90 days then Brilinta alone, continue statin -Therapy recommending outpatient therapy at this time, otherwise stable to discharge as above   Controlled diabetes mellitus type 2 with hyperglycemia, without long-term use of insulin Hemoglobin 6.8, no indication for medication changes Recommend continuation of low-salt low-fat low-carb diet   CAD  -continue Brilinta(stent to LAD 2009)/aspirin as above, statin ongoing Essential hypertension -continue home carvedilol and valsartan -follow blood pressure readings at home, discussed with PCP at appointment later this week Dyslipidemia -continue Crestor  Discharge Instructions  Discharge Instructions     Ambulatory referral to Occupational Therapy   Complete by: As directed       Allergies as of 03/03/2022       Reactions   Food    Lactose intolerant, no dairy products   Lisinopril Cough   Pollen Extract         Medication List     TAKE these medications    aspirin EC 81 MG tablet Take 1 tablet (81 mg total) by mouth daily. Swallow whole. Start taking on: March 04, 2022   Brilinta 60 MG Tabs tablet Generic drug: ticagrelor TAKE ONE TABLET BY MOUTH TWICE A DAY What changed: how much to  take   carvedilol 6.25 MG tablet Commonly known as: COREG TAKE ONE TABLET BY MOUTH  TWICE A DAY WITH MEALS What changed:  how much to take how to take this when to take this   nitroGLYCERIN 0.4 MG SL tablet Commonly known as: Nitrostat Place 1 tablet (0.4 mg total) under the tongue every 5 (five) minutes as needed for chest pain.   rosuvastatin 20 MG tablet Commonly known as: CRESTOR TAKE ONE TABLET BY MOUTH DAILY   saw palmetto 500 MG capsule Take 500 mg by mouth daily.   sildenafil 20 MG tablet Commonly known as: REVATIO Take 20 mg by mouth daily as needed for erectile dysfunction.   SYSTANE BALANCE OP Place 1 drop into both eyes daily as needed (dry eyes).   valsartan 320 MG tablet Commonly known as: DIOVAN TAKE ONE TABLET BY MOUTH DAILY        Follow-up Information     [facility].   Specialty: Rehabilitation Why: for outpatient OT. They will call you to schedule an appointment as a referral has been electronically placed for you. Or you may call to schedule an appointment to get one sooner. Contact information: [address] [place] [id number] [place] [address] [phone]               Allergies  Allergen Reactions   Food     Lactose intolerant, no dairy products   Lisinopril Cough   Pollen Extract     Consultations: Neurology  Procedures/Studies: MR BRAIN WO CONTRAST  Result Date: 03/02/2022 CLINICAL DATA:  Right PCA territory infarct.  Stroke, follow-up. EXAM: MRI HEAD WITHOUT CONTRAST TECHNIQUE: Multiplanar, multiecho pulse sequences of the brain and surrounding structures were obtained without intravenous contrast. COMPARISON:  CT head and CTA head and neck 03/02/2022. FINDINGS: Brain: The diffusion-weighted images confirm acute nonhemorrhagic infarct involving the medial right temporal and occipital lobe. 11 mm acute nonhemorrhagic infarct is present within the right thalamus. Both areas demonstrate increased T2 and FLAIR signal hyperintensity. A  remote infarct involving the anterior left frontal lobe is noted. No significant white matter disease is present apart from the infarcts. The ventricles are of normal size. Deep brain nuclei are within normal limits. No significant extraaxial fluid collection is present. The internal auditory canals are within normal limits. The brainstem and cerebellum are within normal limits. Vascular: Flow is present in the major intracranial arteries. Skull and upper cervical spine: The craniocervical junction is normal. Upper cervical spine is within normal limits. Marrow signal is unremarkable. Sinuses/Orbits: The paranasal sinuses and mastoid air cells are clear. The globes and orbits are within normal limits. IMPRESSION: 1. Acute nonhemorrhagic infarct involving the medial right temporal and occipital lobe. 2. 11 mm acute nonhemorrhagic infarct involving the right thalamus. 3. Remote infarct of the anterior left frontal lobe. These results were called by telephone at the time of interpretation on 03/02/2022 at 2:50 pm to provider Dr. Rory Percy, who verbally acknowledged these results. Electronically Signed   By: San Morelle M.D.   On: 03/02/2022 14:52   ECHOCARDIOGRAM COMPLETE  Result Date: 03/02/2022    ECHOCARDIOGRAM REPORT   Patient Name:   REBEL WILLCUTT Date of Exam: 03/02/2022 Medical Rec #:  [id number]       Height:       68.0 in Accession #:    [id number]      Weight:       181.8 lb Date of Birth:  06-17-1960  BSA:          1.963 m Patient Age:    37 years        BP:           137/85 mmHg Patient Gender: M               HR:           70 bpm. Exam Location:  Inpatient Procedure: 2D Echo Indications:    stroke  History:        Patient has prior history of Echocardiogram examinations, most                 recent 11/24/2019. CAD; Risk Factors:Hypertension and                 Dyslipidemia.  Sonographer:    Johny Chess RDCS Referring Phys: [phone] RONDELL A SMITH IMPRESSIONS  1. Left ventricular  ejection fraction, by estimation, is 50 to 55%. The left ventricle has low normal function. The left ventricle demonstrates regional wall motion abnormalities (see scoring diagram/findings for description). There is mild concentric left ventricular hypertrophy. Left ventricular diastolic parameters are consistent with Grade II diastolic dysfunction (pseudonormalization). Elevated left atrial pressure.  2. Right ventricular systolic function is normal. The right ventricular size is normal.  3. Left atrial size was mildly dilated.  4. The mitral valve is normal in structure. No evidence of mitral valve regurgitation.  5. The aortic valve is tricuspid. Aortic valve regurgitation is trivial.  6. There is borderline dilatation of the ascending aorta, measuring 39 mm. Comparison(s): No significant change from prior study. Prior images reviewed side by side. FINDINGS  Left Ventricle: Left ventricular ejection fraction, by estimation, is 50 to 55%. The left ventricle has low normal function. The left ventricle demonstrates regional wall motion abnormalities. The left ventricular internal cavity size was normal in size. There is mild concentric left ventricular hypertrophy. Left ventricular diastolic parameters are consistent with Grade II diastolic dysfunction (pseudonormalization). Elevated left atrial pressure.  LV Wall Scoring: The apical septal segment, apical anterior segment, apical inferior segment, and apex are hypokinetic. Right Ventricle: The right ventricular size is normal. No increase in right ventricular wall thickness. Right ventricular systolic function is normal. Left Atrium: Left atrial size was mildly dilated. Right Atrium: Right atrial size was normal in size. Pericardium: There is no evidence of pericardial effusion. Mitral Valve: The mitral valve is normal in structure. No evidence of mitral valve regurgitation. Tricuspid Valve: The tricuspid valve is normal in structure. Tricuspid valve regurgitation  is not demonstrated. Aortic Valve: The aortic valve is tricuspid. Aortic valve regurgitation is trivial. Pulmonic Valve: The pulmonic valve was normal in structure. Pulmonic valve regurgitation is not visualized. Aorta: The aortic root is normal in size and structure. There is borderline dilatation of the ascending aorta, measuring 39 mm. IAS/Shunts: No atrial level shunt detected by color flow Doppler.  LEFT VENTRICLE PLAX 2D LVIDd:         4.40 cm   Diastology LVIDs:         3.30 cm   LV e' medial:    6.64 cm/s LV PW:         1.30 cm   LV E/e' medial:  14.1 LV IVS:        1.10 cm   LV e' lateral:   7.72 cm/s LVOT diam:     2.00 cm   LV E/e' lateral: 12.1 LV SV:  71 LV SV Index:   36 LVOT Area:     3.14 cm  RIGHT VENTRICLE             IVC RV S prime:     13.20 cm/s  IVC diam: 1.70 cm TAPSE (M-mode): 2.2 cm LEFT ATRIUM             Index        RIGHT ATRIUM           Index LA diam:        3.60 cm 1.83 cm/m   RA Area:     13.60 cm LA Vol (A2C):   75.9 ml 38.67 ml/m  RA Volume:   30.80 ml  15.69 ml/m LA Vol (A4C):   56.7 ml 28.89 ml/m LA Biplane Vol: 68.4 ml 34.85 ml/m  AORTIC VALVE LVOT Vmax:   112.00 cm/s LVOT Vmean:  76.200 cm/s LVOT VTI:    0.226 m  AORTA Ao Root diam: 3.60 cm Ao Asc diam:  3.90 cm MITRAL VALVE MV Area (PHT): 3.42 cm    SHUNTS MV Decel Time: 222 msec    Systemic VTI:  0.23 m MV E velocity: 93.60 cm/s  Systemic Diam: 2.00 cm MV A velocity: 70.80 cm/s MV E/A ratio:  1.32 Mihai Croitoru MD Electronically signed by Sanda Klein MD Signature Date/Time: 03/02/2022/2:44:18 PM    Final    CT ANGIO HEAD NECK W WO CM W PERF (CODE STROKE)  Result Date: 03/02/2022 CLINICAL DATA:  61 year old male with neurologic deficit beginning 1930 hours yesterday and evidence of evolving right PCA territory infarct on plain CT. EXAM: CT ANGIOGRAPHY HEAD AND NECK CT PERFUSION BRAIN TECHNIQUE: Multidetector CT imaging of the head and neck was performed using the standard protocol during bolus  administration of intravenous contrast. Multiplanar CT image reconstructions and MIPs were obtained to evaluate the vascular anatomy. Carotid stenosis measurements (when applicable) are obtained utilizing NASCET criteria, using the distal internal carotid diameter as the denominator. Multiphase CT imaging of the brain was performed following IV bolus contrast injection. Subsequent parametric perfusion maps were calculated using RAPID software. RADIATION DOSE REDUCTION: This exam was performed according to the departmental dose-optimization program which includes automated exposure control, adjustment of the mA and/or kV according to patient size and/or use of iterative reconstruction technique. CONTRAST:  100 mL Omnipaque 350 COMPARISON:  Plain head CTs today 0821 and 0906 hours. CTA chest 02/05/2021. FINDINGS: CT Brain Perfusion Findings: ASPECTS: Not applicable, right PCA cytotoxic edema. CBF (<30%) Volume: 35m. No abnormal CBF or CBV parameter. Perfusion (Tmax>6.0s) volume: 16 mL, corresponding to the right PCA territory cytotoxic edema. Mismatch Volume: Not applicable Infarction Location:Right PCA CTA NECK Skeleton: Status post C4-C5 cervical disc arthroplasty and superimposed C5-C6 and C6-C7 ACDF. Evidence of chronic pseudoarthrosis at the latter. No acute osseous abnormality identified. Upper chest: Mild dependent atelectasis. Evidence of chronic increased right paratracheal and mediastinal lymph node tissue which seems progressed from 1 year ago (series 5, image 174) but has a nonspecific appearance. Other neck: No acute neck soft tissue finding identified. Aortic arch: 3 vessel arch configuration without atherosclerosis. Right carotid system: Negative brachiocephalic artery and right CCA origin. Mild soft plaque proximal to the bifurcation. Relatively mild soft and calcified plaque at the right ICA origin and bulb with no stenosis. Left carotid system: Minimal soft plaque in the left CCA before the  bifurcation. Minimal soft and calcified plaque at the proximal left ICA without stenosis. Vertebral arteries: Proximal right subclavian artery and  right vertebral artery origin are patent with mild calcified plaque but no significant stenosis. Slightly non dominant right vertebral artery. Obscured at the level of the C4-C5 disc arthroplasty, but otherwise patent to the skull base without stenosis. Minimal soft plaque in the proximal left subclavian artery without stenosis. Normal left vertebral artery origin. Mildly dominant left vertebral artery is patent to the skull base, obscured at the C4-C5 disc arthroplasty level. No stenosis. CTA HEAD Posterior circulation: Dominant left V4 segment. No distal vertebral or vertebrobasilar junction plaque or stenosis. Patent basilar artery without stenosis. Patent basilar tip, SCA and PCA origins. Mild left PCA irregularity most pronounced in the P1 segment. No significant stenosis. Left PCA branches within normal limits. Right PCA P1 and proximal P2 segment are normal but there is occlusion at the P2/P3 junction on series 13, image 18. Some distal reconstitution. Posterior communicating arteries are diminutive or absent. Anterior circulation: Both ICA siphons are patent. Mild left siphon tortuosity and calcified plaque without stenosis. Mild right siphon tortuosity and calcified plaque without stenosis. There is a small right posterior communicating artery with a small infundibulum on series 10, image 92 (normal variant). Patent carotid termini, MCA and ACA origins. Anterior communicating artery and bilateral ACA branches are within normal limits. Left MCA M1 segment and left MCA bifurcation are patent. Left MCA branches are within normal limits. Right MCA M1 segment and bifurcation are patent without stenosis. Right MCA branches are within normal limits. Venous sinuses: Early contrast timing, superior sagittal sinus is patent. Anatomic variants: Left vertebral artery is  mildly dominant throughout. Review of the MIP images confirms the above findings IMPRESSION: 1. Positive for Right PCA occlusion at the P2/P3 junction. Associated Right PCA territory infarct visible by plain CT is only detected via T-max on CTP (CBF and CBV probably pseudo-normalized). 2. No other large vessel occlusion, and mild for age atherosclerosis in the head and neck with no other significant arterial stenosis. 3. Nonspecific lymphadenopathy in the visible superior mediastinum, chronic but progressed from last year. Dr. Rory Percy advises this patient might carried diagnosis of Sarcoidosis. Consider referral to [facility]). 4. Status post C4-C5 cervical disc arthroplasty and C5-C6 and C6-C7 ACDF, evidence of chronic pseudoarthrosis at the latter. Salient findings were discussed by telephone with Dr. Rory Percy on 03/02/2022 at 09:31 . Electronically Signed   By: Genevie Ann M.D.   On: 03/02/2022 09:34   CT HEAD CODE STROKE WO CONTRAST`  Result Date: 03/02/2022 CLINICAL DATA:  Code stroke. 61 year old male with neurologic deficit and acute right PCA territory infarct on head CT this morning. EXAM: CT HEAD WITHOUT CONTRAST TECHNIQUE: Contiguous axial images were obtained from the base of the skull through the vertex without intravenous contrast. RADIATION DOSE REDUCTION: This exam was performed according to the departmental dose-optimization program which includes automated exposure control, adjustment of the mA and/or kV according to patient size and/or use of iterative reconstruction technique. COMPARISON:  0821 hours today. FINDINGS: Brain: Stable non contrast CT appearance of the brain. No acute intracranial hemorrhage identified. No midline shift, mass effect, or evidence of intracranial mass lesion. Vascular: Subtle right PCA hyperdensity as detailed earlier. No new suspicious vascular hyperdensity. Skull: Stable. Sinuses/Orbits: Stable. Other: Stable. ASPECTS [facility] Stroke  Program Early CT Score) - Ganglionic level infarction (caudate, lentiform nuclei, internal capsule, insula, M1-M3 cortex): - Supraganglionic infarction (M4-M6 cortex): Total score (0-10 with 10 being normal): IMPRESSION: Stable from 45 minutes earlier. Acute appearing Right PCA territory infarct with no hemorrhagic transformation  or mass effect. CTA is pending. These results were communicated to Dr. Rory Percy at 9:12 am on 03/02/2022 by text page via the [facility] messaging system. Electronically Signed   By: Genevie Ann M.D.   On: 03/02/2022 09:12   CT HEAD WO CONTRAST  Addendum Date: 03/02/2022   ADDENDUM REPORT: 03/02/2022 08:47 ADDENDUM: Study discussed by telephone with Dr. Gareth Morgan on 03/02/2022 at 0835 hours. Electronically Signed   By: Genevie Ann M.D.   On: 03/02/2022 08:47   Result Date: 03/02/2022 CLINICAL DATA:  61 year old male with left side numbness and weakness since 1930 hours yesterday. EXAM: CT HEAD WITHOUT CONTRAST TECHNIQUE: Contiguous axial images were obtained from the base of the skull through the vertex without intravenous contrast. RADIATION DOSE REDUCTION: This exam was performed according to the departmental dose-optimization program which includes automated exposure control, adjustment of the mA and/or kV according to patient size and/or use of iterative reconstruction technique. COMPARISON:  None Available. FINDINGS: Brain: Chronic encephalomalacia left anterior insula and inferior frontal gyrus. Somewhat generalized cerebral volume loss over that expected for age. No midline shift, mass effect, or evidence of intracranial mass lesion. No ventriculomegaly. No acute intracranial hemorrhage identified. Right PCA territory asymmetric gray and white matter hypodensity compatible with cytotoxic edema (series 3, image 15 and coronal image 45). No associated hemorrhage or mass effect. No other acute cortically based infarct identified. Deep gray nuclei, brainstem, and cerebellum appear  negative. Vascular: Calcified atherosclerosis at the skull base. Subtle right PCA hyperdensity visible in the right ambient cistern also series 3, image 13. Skull: No acute osseous abnormality identified. Sinuses/Orbits: Visualized paranasal sinuses and mastoids are clear. Other: Visualized orbits and scalp soft tissues are within normal limits. IMPRESSION: 1. Acute Right PCA territory infarct, with evidence of associated Right PCA occlusion. No associated hemorrhage or mass effect. 2. Chronic appearing anterior division Left MCA territory infarct with encephalomalacia. 3. No other acute intracranial abnormality identified. Electronically Signed: By: Genevie Ann M.D. On: 03/02/2022 08:29     Subjective: No acute issues or events overnight   Discharge Exam: Vitals:   03/03/22 0752 03/03/22 1137  BP: (!) 175/82 (!) 147/67  Pulse: 66 (!) 57  Resp: 17 17  Temp: 99.1 F (37.3 C) 98.6 F (37 C)  SpO2: 100% 99%   Vitals:   03/03/22 0105 03/03/22 0427 03/03/22 0752 03/03/22 1137  BP: (!) 140/76 (!) 155/75 (!) 175/82 (!) 147/67  Pulse: 62 63 66 (!) 57  Resp: '14 13 17 17  '$ Temp: 98.9 F (37.2 C) 99 F (37.2 C) 99.1 F (37.3 C) 98.6 F (37 C)  TempSrc: Oral Oral Oral Oral  SpO2: 98% 100% 100% 99%    General: Pt is alert, awake, not in acute distress Cardiovascular: RRR, S1/S2 +, no rubs, no gallops Respiratory: CTA bilaterally, no wheezing, no rhonchi Abdominal: Soft, NT, ND, bowel sounds + Extremities: no edema, no cyanosis    The results of significant diagnostics from this hospitalization (including imaging, microbiology, ancillary and laboratory) are listed below for reference.     Microbiology: No results found for this or any previous visit (from the past 240 hour(s)).   Labs: BNP (last 3 results) No results for input(s): "BNP" in the last 8760 hours. Basic Metabolic Panel: Recent Labs  Lab 02/28/22 0817 03/02/22 0726 03/02/22 0747 03/03/22 0227  NA 142 138 140 140  K  4.8 3.9 3.9 3.5  CL 104 106 105 107  CO2 26 23  --  22  GLUCOSE  142* 180* 175* 136*  BUN '15 11 11 8  '$ CREATININE 1.04 1.06 1.00 0.99  CALCIUM 9.7 9.5  --  8.7*   Liver Function Tests: Recent Labs  Lab 03/02/22 0726 03/03/22 0227  AST 28 20  ALT 27 20  ALKPHOS 60 58  BILITOT 1.5* 1.5*  PROT 6.7 5.7*  ALBUMIN 4.4 3.6   No results for input(s): "LIPASE", "AMYLASE" in the last 168 hours. No results for input(s): "AMMONIA" in the last 168 hours. CBC: Recent Labs  Lab 03/02/22 0726 03/02/22 0747 03/03/22 0227  WBC 7.2  --  5.5  NEUTROABS 5.6  --   --   HGB 13.4 14.6 11.9*  HCT 41.3 43.0 37.7*  MCV 90.0  --  90.4  PLT 179  --  161   Cardiac Enzymes: No results for input(s): "CKTOTAL", "CKMB", "CKMBINDEX", "TROPONINI" in the last 168 hours. BNP: Invalid input(s): "POCBNP" CBG: Recent Labs  Lab 03/02/22 2037 03/03/22 0108 03/03/22 0430 03/03/22 0753 03/03/22 1138  GLUCAP 138* 132* 160* 150* 183*   D-Dimer No results for input(s): "DDIMER" in the last 72 hours. Hgb A1c No results for input(s): "HGBA1C" in the last 72 hours. Lipid Profile Recent Labs    03/02/22 1130  CHOL 101  HDL 34*  LDLCALC 57  TRIG 48  CHOLHDL 3.0   Thyroid function studies No results for input(s): "TSH", "T4TOTAL", "T3FREE", "THYROIDAB" in the last 72 hours.  Invalid input(s): "FREET3" Anemia work up No results for input(s): "VITAMINB12", "FOLATE", "FERRITIN", "TIBC", "IRON", "RETICCTPCT" in the last 72 hours. Urinalysis No results found for: "COLORURINE", "APPEARANCEUR", "LABSPEC", "PHURINE", "GLUCOSEU", "HGBUR", "BILIRUBINUR", "KETONESUR", "PROTEINUR", "UROBILINOGEN", "NITRITE", "LEUKOCYTESUR" Sepsis Labs Recent Labs  Lab 03/02/22 0726 03/03/22 0227  WBC 7.2 5.5   Microbiology No results found for this or any previous visit (from the past 240 hour(s)).   Time coordinating discharge: Over 30 minutes  SIGNED:   Little Ishikawa, DO [facility] 03/03/2022,  3:18 PM Pager   If 7PM-7AM, please contact night-coverage [email or web address]

## 2022-03-03 NOTE — Progress Notes (Addendum)
STROKE TEAM PROGRESS NOTE   INTERVAL HISTORY 61 yo male with significant cardiac history on Brilinta presented as code stroke on 03/02/22 with left sided sensory changes and visual deficit.  He was outside of the window for TNK.  He was not felt to be an interventional candidate.    He is seen today sitting on the side of the bed.  His wife is at the bedside.  He is working with the therapist.  He tells [place] he is feeling well today.  He mostly notices the sensory changes on the left side.  He notes no changes since yesterday in this.  Denies any acute needs.  We reviewed all available findings with the patient and he verbalizes understanding.  Vitals:   03/03/22 0000 03/03/22 0105 03/03/22 0427 03/03/22 0752  BP:  (!) 140/76 (!) 155/75 (!) 175/82  Pulse:  62 63 66  Resp: '16 14 13 17  '$ Temp:  98.9 F (37.2 C) 99 F (37.2 C) 99.1 F (37.3 C)  TempSrc:  Oral Oral Oral  SpO2:  98% 100% 100%   CBC:  Recent Labs  Lab 03/02/22 0726 03/02/22 0747 03/03/22 0227  WBC 7.2  --  5.5  NEUTROABS 5.6  --   --   HGB 13.4 14.6 11.9*  HCT 41.3 43.0 37.7*  MCV 90.0  --  90.4  PLT 179  --  409   Basic Metabolic Panel:  Recent Labs  Lab 03/02/22 0726 03/02/22 0747 03/03/22 0227  NA 138 140 140  K 3.9 3.9 3.5  CL 106 105 107  CO2 23  --  22  GLUCOSE 180* 175* 136*  BUN '11 11 8  '$ CREATININE 1.06 1.00 0.99  CALCIUM 9.5  --  8.7*   Lipid Panel:  Recent Labs  Lab 03/02/22 1130  CHOL 101  TRIG 48  HDL 34*  CHOLHDL 3.0  VLDL 10  LDLCALC 57   HgbA1c: No results for input(s): "HGBA1C" in the last 168 hours. 6.8 on 02/19/22. Urine Drug Screen: No results for input(s): "LABOPIA", "COCAINSCRNUR", "LABBENZ", "AMPHETMU", "THCU", "LABBARB" in the last 168 hours.  Alcohol Level  Recent Labs  Lab 03/02/22 0726  ETH <10    IMAGING past 24 hours MR BRAIN WO CONTRAST  Result Date: 03/02/2022 CLINICAL DATA:  Right PCA territory infarct.  Stroke, follow-up. EXAM: MRI HEAD WITHOUT CONTRAST  TECHNIQUE: Multiplanar, multiecho pulse sequences of the brain and surrounding structures were obtained without intravenous contrast. COMPARISON:  CT head and CTA head and neck 03/02/2022. FINDINGS: Brain: The diffusion-weighted images confirm acute nonhemorrhagic infarct involving the medial right temporal and occipital lobe. 11 mm acute nonhemorrhagic infarct is present within the right thalamus. Both areas demonstrate increased T2 and FLAIR signal hyperintensity. A remote infarct involving the anterior left frontal lobe is noted. No significant white matter disease is present apart from the infarcts. The ventricles are of normal size. Deep brain nuclei are within normal limits. No significant extraaxial fluid collection is present. The internal auditory canals are within normal limits. The brainstem and cerebellum are within normal limits. Vascular: Flow is present in the major intracranial arteries. Skull and upper cervical spine: The craniocervical junction is normal. Upper cervical spine is within normal limits. Marrow signal is unremarkable. Sinuses/Orbits: The paranasal sinuses and mastoid air cells are clear. The globes and orbits are within normal limits. IMPRESSION: 1. Acute nonhemorrhagic infarct involving the medial right temporal and occipital lobe. 2. 11 mm acute nonhemorrhagic infarct involving the right thalamus. 3. Remote  infarct of the anterior left frontal lobe. These results were called by telephone at the time of interpretation on 03/02/2022 at 2:50 pm to provider Dr. Rory Percy, who verbally acknowledged these results. Electronically Signed   By: San Morelle M.D.   On: 03/02/2022 14:52   ECHOCARDIOGRAM COMPLETE  Result Date: 03/02/2022    ECHOCARDIOGRAM REPORT   Patient Name:   SHINE MIKES Date of Exam: 03/02/2022 Medical Rec #:  [id number]       Height:       68.0 in Accession #:    [id number]      Weight:       181.8 lb Date of Birth:  May 05, 1960       BSA:          1.963 m  Patient Age:    51 years        BP:           137/85 mmHg Patient Gender: M               HR:           70 bpm. Exam Location:  Inpatient Procedure: 2D Echo Indications:    stroke  History:        Patient has prior history of Echocardiogram examinations, most                 recent 11/24/2019. CAD; Risk Factors:Hypertension and                 Dyslipidemia.  Sonographer:    Johny Chess RDCS Referring Phys: [phone] RONDELL A SMITH IMPRESSIONS  1. Left ventricular ejection fraction, by estimation, is 50 to 55%. The left ventricle has low normal function. The left ventricle demonstrates regional wall motion abnormalities (see scoring diagram/findings for description). There is mild concentric left ventricular hypertrophy. Left ventricular diastolic parameters are consistent with Grade II diastolic dysfunction (pseudonormalization). Elevated left atrial pressure.  2. Right ventricular systolic function is normal. The right ventricular size is normal.  3. Left atrial size was mildly dilated.  4. The mitral valve is normal in structure. No evidence of mitral valve regurgitation.  5. The aortic valve is tricuspid. Aortic valve regurgitation is trivial.  6. There is borderline dilatation of the ascending aorta, measuring 39 mm. Comparison(s): No significant change from prior study. Prior images reviewed side by side. FINDINGS  Left Ventricle: Left ventricular ejection fraction, by estimation, is 50 to 55%. The left ventricle has low normal function. The left ventricle demonstrates regional wall motion abnormalities. The left ventricular internal cavity size was normal in size. There is mild concentric left ventricular hypertrophy. Left ventricular diastolic parameters are consistent with Grade II diastolic dysfunction (pseudonormalization). Elevated left atrial pressure.  LV Wall Scoring: The apical septal segment, apical anterior segment, apical inferior segment, and apex are hypokinetic. Right Ventricle: The right  ventricular size is normal. No increase in right ventricular wall thickness. Right ventricular systolic function is normal. Left Atrium: Left atrial size was mildly dilated. Right Atrium: Right atrial size was normal in size. Pericardium: There is no evidence of pericardial effusion. Mitral Valve: The mitral valve is normal in structure. No evidence of mitral valve regurgitation. Tricuspid Valve: The tricuspid valve is normal in structure. Tricuspid valve regurgitation is not demonstrated. Aortic Valve: The aortic valve is tricuspid. Aortic valve regurgitation is trivial. Pulmonic Valve: The pulmonic valve was normal in structure. Pulmonic valve regurgitation is not visualized. Aorta: The aortic root is normal in size and structure.  There is borderline dilatation of the ascending aorta, measuring 39 mm. IAS/Shunts: No atrial level shunt detected by color flow Doppler.  LEFT VENTRICLE PLAX 2D LVIDd:         4.40 cm   Diastology LVIDs:         3.30 cm   LV e' medial:    6.64 cm/s LV PW:         1.30 cm   LV E/e' medial:  14.1 LV IVS:        1.10 cm   LV e' lateral:   7.72 cm/s LVOT diam:     2.00 cm   LV E/e' lateral: 12.1 LV SV:         71 LV SV Index:   36 LVOT Area:     3.14 cm  RIGHT VENTRICLE             IVC RV S prime:     13.20 cm/s  IVC diam: 1.70 cm TAPSE (M-mode): 2.2 cm LEFT ATRIUM             Index        RIGHT ATRIUM           Index LA diam:        3.60 cm 1.83 cm/m   RA Area:     13.60 cm LA Vol (A2C):   75.9 ml 38.67 ml/m  RA Volume:   30.80 ml  15.69 ml/m LA Vol (A4C):   56.7 ml 28.89 ml/m LA Biplane Vol: 68.4 ml 34.85 ml/m  AORTIC VALVE LVOT Vmax:   112.00 cm/s LVOT Vmean:  76.200 cm/s LVOT VTI:    0.226 m  AORTA Ao Root diam: 3.60 cm Ao Asc diam:  3.90 cm MITRAL VALVE MV Area (PHT): 3.42 cm    SHUNTS MV Decel Time: 222 msec    Systemic VTI:  0.23 m MV E velocity: 93.60 cm/s  Systemic Diam: 2.00 cm MV A velocity: 70.80 cm/s MV E/A ratio:  1.32 Mihai Croitoru MD Electronically signed by Sanda Klein MD Signature Date/Time: 03/02/2022/2:44:18 PM    Final    CT ANGIO HEAD NECK W WO CM W PERF (CODE STROKE)  Result Date: 03/02/2022 CLINICAL DATA:  61 year old male with neurologic deficit beginning 1930 hours yesterday and evidence of evolving right PCA territory infarct on plain CT. EXAM: CT ANGIOGRAPHY HEAD AND NECK CT PERFUSION BRAIN TECHNIQUE: Multidetector CT imaging of the head and neck was performed using the standard protocol during bolus administration of intravenous contrast. Multiplanar CT image reconstructions and MIPs were obtained to evaluate the vascular anatomy. Carotid stenosis measurements (when applicable) are obtained utilizing NASCET criteria, using the distal internal carotid diameter as the denominator. Multiphase CT imaging of the brain was performed following IV bolus contrast injection. Subsequent parametric perfusion maps were calculated using RAPID software. RADIATION DOSE REDUCTION: This exam was performed according to the departmental dose-optimization program which includes automated exposure control, adjustment of the mA and/or kV according to patient size and/or use of iterative reconstruction technique. CONTRAST:  100 mL Omnipaque 350 COMPARISON:  Plain head CTs today 0821 and 0906 hours. CTA chest 02/05/2021. FINDINGS: CT Brain Perfusion Findings: ASPECTS: Not applicable, right PCA cytotoxic edema. CBF (<30%) Volume: 82m. No abnormal CBF or CBV parameter. Perfusion (Tmax>6.0s) volume: 16 mL, corresponding to the right PCA territory cytotoxic edema. Mismatch Volume: Not applicable Infarction Location:Right PCA CTA NECK Skeleton: Status post C4-C5 cervical disc arthroplasty and superimposed C5-C6 and C6-C7 ACDF. Evidence of chronic  pseudoarthrosis at the latter. No acute osseous abnormality identified. Upper chest: Mild dependent atelectasis. Evidence of chronic increased right paratracheal and mediastinal lymph node tissue which seems progressed from 1 year ago (series  5, image 174) but has a nonspecific appearance. Other neck: No acute neck soft tissue finding identified. Aortic arch: 3 vessel arch configuration without atherosclerosis. Right carotid system: Negative brachiocephalic artery and right CCA origin. Mild soft plaque proximal to the bifurcation. Relatively mild soft and calcified plaque at the right ICA origin and bulb with no stenosis. Left carotid system: Minimal soft plaque in the left CCA before the bifurcation. Minimal soft and calcified plaque at the proximal left ICA without stenosis. Vertebral arteries: Proximal right subclavian artery and right vertebral artery origin are patent with mild calcified plaque but no significant stenosis. Slightly non dominant right vertebral artery. Obscured at the level of the C4-C5 disc arthroplasty, but otherwise patent to the skull base without stenosis. Minimal soft plaque in the proximal left subclavian artery without stenosis. Normal left vertebral artery origin. Mildly dominant left vertebral artery is patent to the skull base, obscured at the C4-C5 disc arthroplasty level. No stenosis. CTA HEAD Posterior circulation: Dominant left V4 segment. No distal vertebral or vertebrobasilar junction plaque or stenosis. Patent basilar artery without stenosis. Patent basilar tip, SCA and PCA origins. Mild left PCA irregularity most pronounced in the P1 segment. No significant stenosis. Left PCA branches within normal limits. Right PCA P1 and proximal P2 segment are normal but there is occlusion at the P2/P3 junction on series 13, image 18. Some distal reconstitution. Posterior communicating arteries are diminutive or absent. Anterior circulation: Both ICA siphons are patent. Mild left siphon tortuosity and calcified plaque without stenosis. Mild right siphon tortuosity and calcified plaque without stenosis. There is a small right posterior communicating artery with a small infundibulum on series 10, image 92 (normal variant). Patent  carotid termini, MCA and ACA origins. Anterior communicating artery and bilateral ACA branches are within normal limits. Left MCA M1 segment and left MCA bifurcation are patent. Left MCA branches are within normal limits. Right MCA M1 segment and bifurcation are patent without stenosis. Right MCA branches are within normal limits. Venous sinuses: Early contrast timing, superior sagittal sinus is patent. Anatomic variants: Left vertebral artery is mildly dominant throughout. Review of the MIP images confirms the above findings IMPRESSION: 1. Positive for Right PCA occlusion at the P2/P3 junction. Associated Right PCA territory infarct visible by plain CT is only detected via T-max on CTP (CBF and CBV probably pseudo-normalized). 2. No other large vessel occlusion, and mild for age atherosclerosis in the head and neck with no other significant arterial stenosis. 3. Nonspecific lymphadenopathy in the visible superior mediastinum, chronic but progressed from last year. Dr. Rory Percy advises this patient might carried diagnosis of Sarcoidosis. Consider referral to [facility]). 4. Status post C4-C5 cervical disc arthroplasty and C5-C6 and C6-C7 ACDF, evidence of chronic pseudoarthrosis at the latter. Salient findings were discussed by telephone with Dr. Rory Percy on 03/02/2022 at 09:31 . Electronically Signed   By: Genevie Ann M.D.   On: 03/02/2022 09:34   CT HEAD CODE STROKE WO CONTRAST`  Result Date: 03/02/2022 CLINICAL DATA:  Code stroke. 61 year old male with neurologic deficit and acute right PCA territory infarct on head CT this morning. EXAM: CT HEAD WITHOUT CONTRAST TECHNIQUE: Contiguous axial images were obtained from the base of the skull through the vertex without intravenous contrast. RADIATION DOSE REDUCTION: This exam was performed according  to the departmental dose-optimization program which includes automated exposure control, adjustment of the mA and/or kV according to  patient size and/or use of iterative reconstruction technique. COMPARISON:  0821 hours today. FINDINGS: Brain: Stable non contrast CT appearance of the brain. No acute intracranial hemorrhage identified. No midline shift, mass effect, or evidence of intracranial mass lesion. Vascular: Subtle right PCA hyperdensity as detailed earlier. No new suspicious vascular hyperdensity. Skull: Stable. Sinuses/Orbits: Stable. Other: Stable. ASPECTS [facility] Stroke Program Early CT Score) - Ganglionic level infarction (caudate, lentiform nuclei, internal capsule, insula, M1-M3 cortex): - Supraganglionic infarction (M4-M6 cortex): Total score (0-10 with 10 being normal): IMPRESSION: Stable from 45 minutes earlier. Acute appearing Right PCA territory infarct with no hemorrhagic transformation or mass effect. CTA is pending. These results were communicated to Dr. Rory Percy at 9:12 am on 03/02/2022 by text page via the [facility] messaging system. Electronically Signed   By: Genevie Ann M.D.   On: 03/02/2022 09:12    PHYSICAL EXAM Temp:  [98.4 F (36.9 C)-99.1 F (37.3 C)] 98.6 F (37 C) (12/31 1137) Pulse Rate:  [57-82] 57 (12/31 1137) Resp:  [13-22] 17 (12/31 1137) BP: (140-175)/(67-90) 147/67 (12/31 1137) SpO2:  [98 %-100 %] 99 % (12/31 1137)  General - Well nourished, well developed, in no apparent distress. HEENT- atraumatic, normocephalic  Cardiovascular - Regular rhythm and rate. Pulmonary- equal chest rise; unlabored respirations.   Mental Status -  Alert and awake.  Orientation to time, place and person intact.   Language intact. Speech intact . Attention span and concentration were normal. Recent and remote memory were intact. Fund of Knowledge was assessed and was intact.  Cranial Nerves II - XII - II - LUQ visual field deficit to confrontation. III, IV, VI - Extraocular movements intact. V - Facial sensation intact bilaterally. VII - Facial movement intact bilaterally. VIII - Hearing & vestibular intact  bilaterally.  Motor Strength - strength 5/5 RUE/ RLE.  4+/5 LUE/LLE.  Bulk and tone normal with no fasciculations.   Sensory - Light touch sensation assessed and assymmetric with diminishment LUE/LLL.  Coordination - The patient had normal movements in the hands and feet with no ataxia or dysmetria.  Tremor was absent.  Gait and Station - deferred.   ASSESSMENT/PLAN Mr. MOUSA PROUT is a 60 y.o. male with history of HTN, CAD, DLD, and prediabetes presenting with acute ischemic stroke R PCA.   Stroke R distal PCA territory stroke  Etiology:  Cryptogenic  Code Stroke 03/02/22 CT head with acute R PCA territory infarct without hemorrhage.  CTA head & neck + LVO R PCA P2/P3 junction CT perfusion- CBF and CBV probably pseudo-normalized  MRI  Acute ischemic R temporal and occipital lobe infarct and 11 mm acute ischemic R thalamus infarct.  Remote infarct L anterior frontal lobe. 2D Echo 50-55% EF, midly dilated L atrium. RV normal function. Borderline dilatation ascending aorta. LDL 57 HgbA1c 6.8 VTE prophylaxis - Lovenox    Diet   Diet heart healthy/carb modified Room service appropriate? Yes; Fluid consistency: Thin   Brilinta (ticagrelor) 90 mg bid prior to admission, now on aspirin 81 mg daily and Brilinta (ticagrelor) 90 mg bid. Recommend continuing DAPT for 3 months then back to Brilinta per cardiology. Therapy recommendations:  PT/ST eval pending recs.  OT- OP OT at d/c. Disposition:  likely home when workup completed. Xio cardiac monitoring at discharge if no afib found while inpatient  Hypertension Home meds:  Diovan, Coreg SBP 130-140's at home per patient. Permissive  hypertension (OK if < 220/120) but gradually normalize in 5-7 days Long-term BP goal normotensive, we did discuss even tighter optimization with the patient once home with pcp/ cardiologist.  Hyperlipidemia Home meds:  Crestor 20 mg daily, resumed in hospital LDL 57, goal < 70 Continue statin at  discharge  Diabetes type II Uncontrolled Home meds:  none (previously labeled prediabetic) HgbA1c 6.8, goal < 7.0 We recommended tight control of blood glucose at discharge for stroke risk factor modification. 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  He is on Brilinta for his heart condition will add aspirin for 3 months and then he can resume Brilinta alone.  Therapy ordered and Occupational Therapy has recommended outpatient OT.  Will go home with a Zio patch to evaluate for atrial fibrillation.  He should follow-up in stroke clinic with Dr. [name] at [facility] neurological after discharge.  He cannot work until cleared by neurology outpatient due to the nature of his work.  He is to also not drive.  He and his wife understand this.  Neurology will sign off please call with questions.  Dr. Reeves Forth evaluated pt independently, reviewed imaging, chart, labs. Discussed and formulated plan with the Resident/APP. Changes were made to the note where appropriate. Please see APP/resident note above for details.    Tritia Endo,MD   To contact Stroke Continuity provider, please refer to [email or web address] After hours, contact General Neurology

## 2022-03-03 NOTE — TOC Transition Note (Signed)
Transition of Care [facility]) - CM/SW Discharge Note   Patient Details  Name: Zachary Flynn MRN: [id number] Date of Birth: 05/08/1960  [facility] of [facility] CM/SW Contact:  Carles Collet, RN Phone Number: 03/03/2022, 3:16 PM   Clinical Narrative:     Outpatient OT referral sent to [facility] Neuro, added to AVS. Requested nurse to reprint AVS to insclude follow up provider if needed.  No other TOC needs identified.         Patient Goals and CMS Choice      Discharge Placement                         Discharge Plan and Services Additional resources added to the After Visit Summary for                                       Social Determinants of Health (SDOH) Interventions SDOH Screenings   Depression (PHQ2-9): Low Risk  (08/05/2018)  Tobacco Use: Low Risk  (03/02/2022)     Readmission Risk Interventions     No data to display

## 2022-03-03 NOTE — Evaluation (Signed)
Occupational Therapy Evaluation Patient Details Name: Zachary Flynn MRN: [id number] DOB: 10/10/1960 Today's Date: 03/03/2022   History of Present Illness Pt is a 61 y/o M presenting wiht L weakness/numbness and dizziness. MRI revealing acute nonhemorrhagic infarct involivng medial R temporal and occipital lobe, 44m acute nonhemorrhagic infarct involving R thalamus, remote infarct on anterior L frontal lobe. PMH includes CAD, chronic diastolic CHF, HTN, HLD, dilation of ascending aorta   Clinical Impression   PTA, pt independent at baseline with ADLs and functional mobility, lives with spouse who can assist at d/c. Pt currently needing supervision for ADLs, mod I for bed mobility and transfers without AD. Pt with LUE weakness and  impaired coordination/sensation, provided with squeeze ball and fine motor coordination handout. Pt also with L upper visual field impairment, however with good awareness, educated on visual compensatory strategies and pt verbalized understanding. Pt presenting with impairments listed below, will follow acutely. Recommend OP OT at d/c.      Recommendations for follow up therapy are one component of a multi-disciplinary discharge planning process, led by the attending physician.  Recommendations may be updated based on patient status, additional functional criteria and insurance authorization.   Follow Up Recommendations  Outpatient OT     Assistance Recommended at Discharge Frequent or constant Supervision/Assistance  Patient can return home with the following A little help with walking and/or transfers;A little help with bathing/dressing/bathroom;Direct supervision/assist for financial management;Direct supervision/assist for medications management;Assistance with cooking/housework;Assist for transportation    Functional Status Assessment  Patient has had a recent decline in their functional status and demonstrates the ability to make significant improvements in  function in a reasonable and predictable amount of time.  Equipment Recommendations  None recommended by OT    Recommendations for Other Services       Precautions / Restrictions Precautions Precautions: None Restrictions Weight Bearing Restrictions: No      Mobility Bed Mobility Overal bed mobility: Modified Independent                  Transfers Overall transfer level: Modified independent                 General transfer comment: seen walking in hall with spouse prior to session without difficulty      Balance Overall balance assessment: Mild deficits observed, not formally tested                                         ADL either performed or assessed with clinical judgement   ADL Overall ADL's : At baseline;Needs assistance/impaired Eating/Feeding: Set up   Grooming: Set up   Upper Body Bathing: Supervision/ safety   Lower Body Bathing: Supervison/ safety   Upper Body Dressing : Supervision/safety   Lower Body Dressing: Supervision/safety   Toilet Transfer: Supervision/safety   Toileting- Clothing Manipulation and Hygiene: Supervision/safety       Functional mobility during ADLs: Supervision/safety       Vision Baseline Vision/History: 1 Wears glasses Ability to See in Adequate Light: 1 Impaired Vision Assessment?: Yes Eye Alignment: Within Functional Limits Ocular Range of Motion: Within Functional Limits Alignment/Gaze Preference: Within Defined Limits Visual Fields: Left superior homonymous quadranopsia Additional Comments: difficulty identify stimuli in L upper quadrant of visual field     Perception Perception Perception Tested?: No   Praxis Praxis Praxis tested?: Within functional limits    Pertinent Vitals/Pain Pain  Assessment Pain Assessment: Faces Pain Score: 2  Faces Pain Scale: Hurts a little bit Pain Location: headache Pain Descriptors / Indicators: Discomfort Pain Intervention(s): Limited  activity within patient's tolerance, Monitored during session, Repositioned     Hand Dominance Left   Extremity/Trunk Assessment Upper Extremity Assessment Upper Extremity Assessment: LUE deficits/detail LUE Deficits / Details: grossly WFL, decreased coordination LUE Coordination: decreased fine motor   Lower Extremity Assessment Lower Extremity Assessment: Defer to PT evaluation   Cervical / Trunk Assessment Cervical / Trunk Assessment: Normal   Communication     Cognition Arousal/Alertness: Awake/alert Behavior During Therapy: WFL for tasks assessed/performed Overall Cognitive Status: Within Functional Limits for tasks assessed                                 General Comments: provides PLOF and home set up approrpiately, good awareness of deficits     General Comments  VSS on RA, spouse present    Exercises     Shoulder Instructions      Home Living Family/patient expects to be discharged to:: Private residence Living Arrangements: Spouse/significant other Available Help at Discharge: Family;Available PRN/intermittently Type of Home: House Home Access: Stairs to enter [employer] of Steps: 2   Home Layout: One level     Bathroom Shower/Tub: Walk-in shower         Home Equipment: None          Prior Functioning/Environment Prior Level of Function : Independent/Modified Independent;Driving;Working/employed             Mobility Comments: no AD use ADLs Comments: works as a Engineer, structural at [employer] Problem List: Decreased strength;Decreased range of motion;Impaired UE functional use      OT Treatment/Interventions: Self-care/ADL training;Therapeutic exercise;Energy conservation;DME and/or AE instruction;Therapeutic activities;Patient/family education;Balance training    OT Goals(Current goals can be found in the care plan section) Acute Rehab OT Goals Patient Stated Goal: none stated OT Goal  Formulation: With patient Time For Goal Achievement: 03/17/22 Potential to Achieve Goals: Good  OT Frequency: Min 2X/week    Co-evaluation              AM-PAC OT "6 Clicks" Daily Activity     Outcome Measure Help from another person eating meals?: A Little Help from another person taking care of personal grooming?: A Little Help from another person toileting, which includes using toliet, bedpan, or urinal?: None Help from another person bathing (including washing, rinsing, drying)?: A Little Help from another person to put on and taking off regular upper body clothing?: None Help from another person to put on and taking off regular lower body clothing?: None 6 Click Score: 21   End of Session Nurse Communication: Mobility status  Activity Tolerance: Patient tolerated treatment well Patient left: in bed;with call bell/phone within reach;with family/visitor present  OT Visit Diagnosis: Unsteadiness on feet (R26.81);Other abnormalities of gait and mobility (R26.89);Muscle weakness (generalized) (M62.81)                Time: 1610-9604 OT Time Calculation (min): 24 min Charges:  OT General Charges $OT Visit: 1 Visit OT Evaluation $OT Eval Moderate Complexity: 1 Mod OT Treatments $Therapeutic Activity: 8-22 mins  Renaye Rakers, OTD, OTR/L SecureChat Preferred Acute Rehab [phone]  Renaye Rakers [place] 03/03/2022, 11:35 AM

## 2022-03-03 NOTE — Progress Notes (Signed)
PT Cancellation Note  Patient Details Name: Zachary Flynn MRN: [id number] DOB: [birth year]   Cancelled Treatment:    Reason Eval/Treat Not Completed: Other (comment) Patient dressed and ready for d/c wheelchair in the room.  Discussed OT findings and that will have outpatient follow up.  Will allow outpatient OT to screen for PT needs.  Patient and caregiver in agreement.   Reginia Naas 03/03/2022, 4:04 PM Magda Kiel, PT Acute Rehabilitation Services Office:[phone] 03/03/2022

## 2022-03-05 ENCOUNTER — Other Ambulatory Visit: Payer: Self-pay | Admitting: Cardiology

## 2022-03-05 DIAGNOSIS — I4891 Unspecified atrial fibrillation: Secondary | ICD-10-CM

## 2022-03-05 DIAGNOSIS — I639 Cerebral infarction, unspecified: Secondary | ICD-10-CM

## 2022-03-12 ENCOUNTER — Ambulatory Visit: Payer: 59 | Attending: Cardiology

## 2022-03-12 ENCOUNTER — Ambulatory Visit: Payer: 59 | Attending: Internal Medicine | Admitting: Occupational Therapy

## 2022-03-12 ENCOUNTER — Other Ambulatory Visit: Payer: Self-pay

## 2022-03-12 DIAGNOSIS — I639 Cerebral infarction, unspecified: Secondary | ICD-10-CM

## 2022-03-12 DIAGNOSIS — I69354 Hemiplegia and hemiparesis following cerebral infarction affecting left non-dominant side: Secondary | ICD-10-CM | POA: Diagnosis present

## 2022-03-12 DIAGNOSIS — R278 Other lack of coordination: Secondary | ICD-10-CM | POA: Diagnosis present

## 2022-03-12 DIAGNOSIS — R208 Other disturbances of skin sensation: Secondary | ICD-10-CM | POA: Diagnosis present

## 2022-03-12 DIAGNOSIS — I4891 Unspecified atrial fibrillation: Secondary | ICD-10-CM | POA: Diagnosis not present

## 2022-03-12 NOTE — Therapy (Signed)
OUTPATIENT OCCUPATIONAL THERAPY NEURO EVALUATION  Patient Name: Zachary Flynn MRN: [id number] DOB:24-Jan-1961, 62 y.o., male Today's Date: 03/12/2022  PCP: Lawerance Cruel, MD REFERRING PROVIDER: Little Ishikawa, MD  END OF SESSION:  OT End of Session - 03/12/22 4132     Visit Number 1    Number of Visits 4    Date for OT Re-Evaluation 04/12/22    Authorization Type [employer]    OT Start Time 0803    OT Stop Time 0850    OT Time Calculation (min) 47 min             Past Medical History:  Diagnosis Date   CAD (coronary artery disease) 12/02/2018   a. 100% proximal LAD insetting of anterior STEMI--PCI: Resolute DES 3.0 mm x 22 mm (postdilated 3.5-3.1 mm).  Also 50% proximal RI.  Left dominant system.  Normal EF by LV gram.   Cardiogenic shock ([place]) 12/2018   Mild shock complicating anterior STEMI; did not require mechanical support   Cervical disc disease    Chronic diastolic CHF (congestive heart failure), NYHA class 1 ([place]) 11/2018   a. LVEDP elevated at time of MI ->; GRII DD on follow-up echo   Erectile dysfunction    Essential hypertension 12/02/2018   Hyperlipidemia with target LDL less than 70 12/02/2018   Mild dilation of ascending aorta ([place])    a. by echo 12/2018.   Pre-diabetes    Past Surgical History:  Procedure Laterality Date   CERVICAL SPINE SURGERY  2012   Dr. Drema Pry ACUTE MI REVASCULARIZATION N/A 12/02/2018   Procedure: Coronary/Graft Acute MI Revascularization-CORONARY STENT INTERVENTION;  Surgeon: [name], MD;  Location: [facility];  Service: Cardiovascular;; p-mLAD 100% w/ 50% D1 ostial dz ( PCI - Resolute Onyx DES 3.0 mm x 32 mm --postdilated 3.5-3.1 mm).   LEFT HEART CATH AND CORONARY ANGIOGRAPHY N/A 12/02/2018   Procedure: LEFT HEART CATH AND CORONARY ANGIOGRAPHY;  Surgeon: [name], MD;  Location: [facility]; INDICATION-ANTERIOR STEMI: p-mLAD 100% w/ 50% D1 ostial dz (DES PCI).   Moderate RI 55 %.  EF 55 to 60% (preliminary LV gram showed anterior hypokinesis, resolved following PCI).  Severely elevated LVEDP of 29 mmHg (acute diasto   REVISION TOTAL HIP ARTHROPLASTY Left 2003   TOTAL HIP ARTHROPLASTY Left 2000   Dr. Durward Fortes   TRANSTHORACIC ECHOCARDIOGRAM  12/02/2018   (In setting of anterior STEMI) EF 50-55%.  Apical septal and apical anterior akinesis.  Moderate LVH.  GR 1 DD.  Normal LA and RA size.  No valvular disease.  Mildly dilated ascending aorta of 39 mm.   TRANSTHORACIC ECHOCARDIOGRAM  11/24/2019   EF 45 to 50%.  Mildly decreased function.  Hypokinesis of the mid-apical anteroseptal and apical wall.  GRII DD.  (Pseudonormalization).  Normal RV size and function.  Mildly dilated ascending aorta 43 mm.  Normal CVP.   Patient Active Problem List   Diagnosis Date Noted   CVA (cerebral vascular accident) ([place]) 03/02/2022   Controlled type 2 diabetes mellitus without complication, without long-term current use of insulin ([place]) 03/02/2022   Hilar adenopathy 03/21/2021   Mild dilation of ascending aorta ([place]) 12/04/2018   Pre-diabetes    Essential hypertension 12/02/2018   Dyslipidemia 12/02/2018   STEMI involving left anterior descending coronary artery ([place]) 12/02/2018   Coronary artery disease involving native coronary artery of native heart without angina pectoris 12/02/2018   Presence of drug coated stent  in LAD coronary artery 12/02/2018    ONSET DATE: 03/03/22  REFERRING DIAG: I63.9 (ICD-10-CM) - Cerebrovascular accident (CVA), unspecified mechanism  THERAPY DIAG:  Hemiplegia and hemiparesis following cerebral infarction affecting left non-dominant side (HCC)  Other lack of coordination  Other disturbances of skin sensation  Rationale for Evaluation and Treatment: Rehabilitation  SUBJECTIVE:   SUBJECTIVE STATEMENT: Pt reports that he was watching the football game and started feeling dizzy.  Spouse took pt to the hospital, MRI revealing  acute nonhemorrhagic infarct involivng medial R temporal and occipital lobe, 45m acute nonhemorrhagic infarct involving R thalamus, remote infarct on anterior L frontal lobe.  Pt reports that Numbness on light side has subsided, but still noticing some tingling.  Pt reports numbness and tingling on L ear, L side, and in LUE and feels like he is sitting on something.   Pt accompanied by: self and significant other  PERTINENT HISTORY: CAD, chronic diastolic CHF, HTN, HLD, dilation of ascending aorta  PRECAUTIONS: None  WEIGHT BEARING RESTRICTIONS: No  PAIN:  Are you having pain? No  FALLS: Has patient fallen in last 6 months? No  LIVING ENVIRONMENT: Lives with: lives with their spouse and adult son comes and goes Lives in: House/apartment Stairs: Yes: External: 2-3 steps; none Has following equipment at home: None  PLOF: Independent, retired [employer] [place] none  OBJECTIVE:   [place] Left  ADLs: Overall ADLs: Mod I, reports occasional increased time when tying shoes LB Dressing: takes a little longer for tying shoes Equipment: Walk in shower  IADLs: Light housekeeping: Mod I Meal Prep: Mod I - does report that he is using non-dominant RUE more than normal Community mobility: not driving until cleared by MD Medication management: Mod I Financial management: Mod I Handwriting: 90% legible  MOBILITY STATUS: Independent  POSTURE COMMENTS:  No Significant postural limitations  ACTIVITY TOLERANCE: Activity tolerance: WFL for tasks assessed on eval  FUNCTIONAL OUTCOME MEASURES: Upper Extremity Functional Scale (UEFS): 76/80  UPPER EXTREMITY ROM:  WFL bilaterally  UPPER EXTREMITY MMT:   WFL bilaterally  HAND FUNCTION: Grip strength: Right: 95 lbs; Left: 96 lbs, Lateral pinch: Right: 28 lbs, Left: 24 lbs, and 3 point pinch: Right: 22 lbs, Left: 18 lbs  COORDINATION: Finger Nose Finger test: very slight  dysmetria on L compared to R 9 Hole Peg test: Right: 26.03 sec; Left: 32.22 sec (also noted pt with decreased use of index finger on L) Box and Blocks:  Right 52 blocks, Left 48 blocks  SENSATION: Light touch: mild impairments to light touch on L thumb and index finger  COGNITION: Overall cognitive status: Within functional limits for tasks assessed  VISION: Subjective report: reports wearing glasses or contacts Baseline vision: Wears glasses all the time  VISION ASSESSMENT: Inconsistent visual report on upper L quadrant with confrontation testing, delayed to stimulus in L upper quadrant   TODAY'S TREATMENT:                                                                        03/12/22 Educated on fine motor control exercises, in-hand manipulation exercises, and strengthening with theraputty.  OT provided verbal instructions and demonstrations of activities.  Educated on  visual attention to tasks to compensate for mild impairments in sensation in L hand, especially with thumb and index finger.  PATIENT EDUCATION: Education details: Educated on role and purpose of OT as well as potential interventions and goals for therapy based on initial evaluation findings. Person educated: Patient and Spouse Education method: Explanation, Demonstration, and Handouts Education comprehension: verbalized understanding  HOME EXERCISE PROGRAM: Coordination, in-hand manipulation, and theraputty HEP printed from [place]: Goals reviewed with patient? Yes  SHORT TERM GOALS: Target date: 04/12/22  Pt will be independent with HEP for [facility] and strengthening. Baseline: Goal status: INITIAL  2.  Pt will demonstrate improved fine motor coordination for ADLs as evidenced by decreasing 9 hole peg test score for LUE by 4 secs Baseline: 26.03 sec; Left: 32.22 sec Goal status: INITIAL  3.  Pt will demonstrate improved pinch strength by 3# to increase ease and independence with ADLs and  IADLs. Baseline: Right: 28 lbs, Left: 24 lbs, and 3 point pinch: Right: 22 lbs, Left: 18 lbs Goal status: INITIAL  ASSESSMENT:  CLINICAL IMPRESSION: Patient is a 62 y.o. male who was seen today for occupational therapy evaluation for impairments s/p CVA. Pt continues to report impaired sensation on L dominant side, impacting coordination and handwriting.  Pt expressing desire to continue to work on items at home, therefore provided pt with coordination and strengthening HEP to complete at home.  Discussed plan to leave POC open to allow for return visit in 30 days if needed.  Pt and wife in agreement with plan. 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  Pt will benefit from skilled occupational therapy services to address strength and coordination, altered sensation, GM/FM control, safety awareness, introduction of compensatory strategies/AE prn, visual-perception, and implementation of an HEP to improve participation and safety during ADLs and IADLs.   PERFORMANCE DEFICITS: in functional skills including ADLs, IADLs, coordination, sensation, strength, Fine motor control, Gross motor control, and UE functional use.  IMPAIRMENTS: are limiting patient from ADLs and IADLs.   CO-MORBIDITIES: may have co-morbidities  that affects occupational performance. Patient will benefit from skilled OT to address above impairments and improve overall function.  MODIFICATION OR ASSISTANCE TO COMPLETE EVALUATION: No modification of tasks or assist necessary to complete an evaluation.  OT OCCUPATIONAL PROFILE AND HISTORY: Problem focused assessment: Including review of records relating to presenting problem.  CLINICAL DECISION MAKING: LOW - limited treatment options, no task modification necessary  REHAB POTENTIAL: Excellent  EVALUATION COMPLEXITY: Low    PLAN:  OT FREQUENCY: 1x/week  OT DURATION: 4 weeks  PLANNED  INTERVENTIONS: self care/ADL training, therapeutic exercise, therapeutic activity, neuromuscular re-education, compression bandaging, moist heat, cryotherapy, patient/family education, and visual/perceptual remediation/compensation  RECOMMENDED OTHER SERVICES: NA  CONSULTED AND AGREED WITH PLAN OF CARE: Patient and family member/caregiver  PLAN FOR NEXT SESSION: Review coordination, strengthening HEP    Sabre Leonetti, [place], OTR/L 03/12/2022, 9:08 AM

## 2022-03-19 ENCOUNTER — Encounter (HOSPITAL_COMMUNITY): Payer: Self-pay | Admitting: Emergency Medicine

## 2022-03-19 ENCOUNTER — Emergency Department (HOSPITAL_COMMUNITY): Payer: 59

## 2022-03-19 ENCOUNTER — Inpatient Hospital Stay (HOSPITAL_COMMUNITY)
Admission: EM | Admit: 2022-03-19 | Discharge: 2022-03-21 | DRG: 064 | Disposition: A | Discharge status: Home / Self Care | Payer: 59 | Attending: Neurology | Admitting: Neurology

## 2022-03-19 ENCOUNTER — Other Ambulatory Visit: Payer: Self-pay

## 2022-03-19 DIAGNOSIS — Z7982 Long term (current) use of aspirin: Secondary | ICD-10-CM

## 2022-03-19 DIAGNOSIS — Z79899 Other long term (current) drug therapy: Secondary | ICD-10-CM

## 2022-03-19 DIAGNOSIS — G935 Compression of brain: Secondary | ICD-10-CM | POA: Diagnosis present

## 2022-03-19 DIAGNOSIS — Z8249 Family history of ischemic heart disease and other diseases of the circulatory system: Secondary | ICD-10-CM

## 2022-03-19 DIAGNOSIS — R29701 NIHSS score 1: Secondary | ICD-10-CM | POA: Diagnosis present

## 2022-03-19 DIAGNOSIS — T45525A Adverse effect of antithrombotic drugs, initial encounter: Secondary | ICD-10-CM | POA: Diagnosis present

## 2022-03-19 DIAGNOSIS — T39015A Adverse effect of aspirin, initial encounter: Secondary | ICD-10-CM | POA: Diagnosis present

## 2022-03-19 DIAGNOSIS — I77810 Thoracic aortic ectasia: Secondary | ICD-10-CM | POA: Diagnosis present

## 2022-03-19 DIAGNOSIS — Z955 Presence of coronary angioplasty implant and graft: Secondary | ICD-10-CM

## 2022-03-19 DIAGNOSIS — I63531 Cerebral infarction due to unspecified occlusion or stenosis of right posterior cerebral artery: Secondary | ICD-10-CM | POA: Diagnosis present

## 2022-03-19 DIAGNOSIS — Z888 Allergy status to other drugs, medicaments and biological substances status: Secondary | ICD-10-CM | POA: Diagnosis not present

## 2022-03-19 DIAGNOSIS — Z7902 Long term (current) use of antithrombotics/antiplatelets: Secondary | ICD-10-CM

## 2022-03-19 DIAGNOSIS — H53462 Homonymous bilateral field defects, left side: Secondary | ICD-10-CM | POA: Diagnosis present

## 2022-03-19 DIAGNOSIS — E119 Type 2 diabetes mellitus without complications: Secondary | ICD-10-CM | POA: Diagnosis present

## 2022-03-19 DIAGNOSIS — I2510 Atherosclerotic heart disease of native coronary artery without angina pectoris: Secondary | ICD-10-CM | POA: Diagnosis present

## 2022-03-19 DIAGNOSIS — I252 Old myocardial infarction: Secondary | ICD-10-CM | POA: Diagnosis not present

## 2022-03-19 DIAGNOSIS — M5090 Cervical disc disorder, unspecified, unspecified cervical region: Secondary | ICD-10-CM | POA: Diagnosis present

## 2022-03-19 DIAGNOSIS — E785 Hyperlipidemia, unspecified: Secondary | ICD-10-CM | POA: Diagnosis present

## 2022-03-19 DIAGNOSIS — I610 Nontraumatic intracerebral hemorrhage in hemisphere, subcortical: Principal | ICD-10-CM | POA: Diagnosis present

## 2022-03-19 DIAGNOSIS — R42 Dizziness and giddiness: Secondary | ICD-10-CM | POA: Diagnosis present

## 2022-03-19 DIAGNOSIS — I611 Nontraumatic intracerebral hemorrhage in hemisphere, cortical: Secondary | ICD-10-CM

## 2022-03-19 DIAGNOSIS — I110 Hypertensive heart disease with heart failure: Secondary | ICD-10-CM | POA: Diagnosis present

## 2022-03-19 DIAGNOSIS — I5032 Chronic diastolic (congestive) heart failure: Secondary | ICD-10-CM | POA: Diagnosis present

## 2022-03-19 DIAGNOSIS — Z8673 Personal history of transient ischemic attack (TIA), and cerebral infarction without residual deficits: Secondary | ICD-10-CM

## 2022-03-19 DIAGNOSIS — I619 Nontraumatic intracerebral hemorrhage, unspecified: Principal | ICD-10-CM | POA: Diagnosis present

## 2022-03-19 LAB — CBC WITH DIFFERENTIAL/PLATELET
Abs Immature Granulocytes: 0.05 10*3/uL (ref 0.00–0.07)
Basophils Absolute: 0 10*3/uL (ref 0.0–0.1)
Basophils Relative: 0 %
Eosinophils Absolute: 0.2 10*3/uL (ref 0.0–0.5)
Eosinophils Relative: 1 %
HCT: 38.6 % — ABNORMAL LOW (ref 39.0–52.0)
Hemoglobin: 12.7 g/dL — ABNORMAL LOW (ref 13.0–17.0)
Immature Granulocytes: 1 %
Lymphocytes Relative: 9 %
Lymphs Abs: 0.9 10*3/uL (ref 0.7–4.0)
MCH: 29.8 pg (ref 26.0–34.0)
MCHC: 32.9 g/dL (ref 30.0–36.0)
MCV: 90.6 fL (ref 80.0–100.0)
Monocytes Absolute: 0.6 10*3/uL (ref 0.1–1.0)
Monocytes Relative: 5 %
Neutro Abs: 8.8 10*3/uL — ABNORMAL HIGH (ref 1.7–7.7)
Neutrophils Relative %: 84 %
Platelets: 177 10*3/uL (ref 150–400)
RBC: 4.26 MIL/uL (ref 4.22–5.81)
RDW: 11.9 % (ref 11.5–15.5)
WBC: 10.5 10*3/uL (ref 4.0–10.5)
nRBC: 0 % (ref 0.0–0.2)

## 2022-03-19 LAB — TROPONIN I (HIGH SENSITIVITY): Troponin I (High Sensitivity): 3 ng/L (ref <18)

## 2022-03-19 LAB — BASIC METABOLIC PANEL
Anion gap: 10 (ref 5–15)
BUN: 17 mg/dL (ref 8–23)
CO2: 21 mmol/L — ABNORMAL LOW (ref 22–32)
Calcium: 9.2 mg/dL (ref 8.9–10.3)
Chloride: 105 mmol/L (ref 98–111)
Creatinine, Ser: 1.14 mg/dL (ref 0.61–1.24)
GFR, Estimated: >60 mL/min (ref >60)
Glucose, Bld: 235 mg/dL — ABNORMAL HIGH (ref 70–99)
Potassium: 4.1 mmol/L (ref 3.5–5.1)
Sodium: 136 mmol/L (ref 135–145)

## 2022-03-19 LAB — CBG MONITORING, ED: Glucose-Capillary: 220 mg/dL — ABNORMAL HIGH (ref 70–99)

## 2022-03-19 MED ORDER — LABETALOL HCL 5 MG/ML IV SOLN
10.0000 mg | Freq: Once | INTRAVENOUS | Status: AC
  Administered 2022-03-19: 10 mg via INTRAVENOUS
  Filled 2022-03-19: qty 4

## 2022-03-19 MED ORDER — ACETAMINOPHEN 325 MG PO TABS: 650.0000 mg | ORAL_TABLET | ORAL | Status: DC | PRN

## 2022-03-19 MED ORDER — ACETAMINOPHEN 650 MG RE SUPP: 650.0000 mg | RECTAL | Status: DC | PRN

## 2022-03-19 MED ORDER — SENNOSIDES-DOCUSATE SODIUM 8.6-50 MG PO TABS
1.0000 | ORAL_TABLET | Freq: Two times a day (BID) | ORAL | Status: DC
  Filled 2022-03-19 (×2): qty 1

## 2022-03-19 MED ORDER — PANTOPRAZOLE SODIUM 40 MG IV SOLR
40.0000 mg | Freq: Every day | INTRAVENOUS | Status: DC
  Administered 2022-03-20 (×2): 40 mg via INTRAVENOUS
  Filled 2022-03-19 (×2): qty 10

## 2022-03-19 MED ORDER — STROKE: EARLY STAGES OF RECOVERY BOOK
Freq: Once | Status: AC
  Filled 2022-03-19: qty 1

## 2022-03-19 MED ORDER — ACETAMINOPHEN 160 MG/5ML PO SOLN: 650.0000 mg | ORAL | Status: DC | PRN

## 2022-03-19 NOTE — ED Provider Notes (Signed)
[facility] EMERGENCY DEPARTMENT Provider Note   CSN: [id number] Arrival date & time: 03/19/22  2023     History  Chief Complaint  Patient presents with   Dizziness    Zachary Flynn is a 62 y.o. male.  HPI      62 y.o. male with PMH significant for CAD, previous cardiogenic shock from anterior STEMI (on Brilinta), CHF, HTN, HLD, recent R PCA stroke without hemorrhage and discharged om Aspirin '81mg'$  daily along with Brilinta 90 BID with residual numbness to left side presents with concern for episode of feeling off balance.  Reports feeling of off balance reminded him of prior CVA and called EMS. On EMS arrival, he began to feel better. Reports symptoms lasted less than 30 minutes.  Then while in the ED symptoms returned again, sensation of being off balance, lasted 5 minutes then resolved.  Denies new numbness, weakness, change in vision, change in speech, difficulty walking except during these 2 episodes . Denies recent head trauma. Had slight headache yesterday but denies headache today. No nausea or vomiting.   Past Medical History:  Diagnosis Date   CAD (coronary artery disease) 12/02/2018   a. 100% proximal LAD insetting of anterior STEMI--PCI: Resolute DES 3.0 mm x 22 mm (postdilated 3.5-3.1 mm).  Also 50% proximal RI.  Left dominant system.  Normal EF by LV gram.   Cardiogenic shock ([place]) 12/2018   Mild shock complicating anterior STEMI; did not require mechanical support   Cervical disc disease    Chronic diastolic CHF (congestive heart failure), NYHA class 1 ([place]) 11/2018   a. LVEDP elevated at time of MI ->; GRII DD on follow-up echo   Erectile dysfunction    Essential hypertension 12/02/2018   Hyperlipidemia with target LDL less than 70 12/02/2018   Mild dilation of ascending aorta ([place])    a. by echo 12/2018.   Pre-diabetes      Home Medications Prior to Admission medications   Medication Sig Start Date End Date Taking? Authorizing Provider   aspirin EC 81 MG tablet Take 1 tablet (81 mg total) by mouth daily. Swallow whole. 03/04/22   Little Ishikawa, MD  carvedilol (COREG) 6.25 MG tablet TAKE ONE TABLET BY MOUTH TWICE A DAY WITH MEALS Patient taking differently: Take 6.25 mg by mouth 2 (two) times daily with a meal. TAKE ONE TABLET BY MOUTH TWICE A DAY WITH MEALS 09/13/21   [name], MD  nitroGLYCERIN (NITROSTAT) 0.4 MG SL tablet Place 1 tablet (0.4 mg total) under the tongue every 5 (five) minutes as needed for chest pain. 06/29/20 03/02/22  [name], MD  Propylene Glycol (SYSTANE BALANCE OP) Place 1 drop into both eyes daily as needed (dry eyes).    [provider]  rosuvastatin (CRESTOR) 20 MG tablet TAKE ONE TABLET BY MOUTH DAILY Patient taking differently: Take 20 mg by mouth daily. 01/08/22   [name], MD  saw palmetto 500 MG capsule Take 500 mg by mouth daily.    [provider]  sildenafil (REVATIO) 20 MG tablet Take 20 mg by mouth daily as needed for erectile dysfunction. 08/27/18   [provider]  ticagrelor (BRILINTA) 60 MG TABS tablet TAKE ONE TABLET BY MOUTH TWICE A DAY Patient taking differently: Take 60 mg by mouth 2 (two) times daily. 02/07/22   [name], MD  valsartan (DIOVAN) 320 MG tablet TAKE ONE TABLET BY MOUTH DAILY 01/08/22   [name], MD  Allergies    Food, Lisinopril, and Pollen extract    Review of Systems   Review of Systems  Physical Exam Updated Vital Signs BP (!) 151/67 (BP Location: Right Arm)   Pulse 90   Temp 98.7 F (37.1 C) (Oral)   Resp (!) 21   Ht '5\' 8"'$  (1.727 m)   Wt 79 kg   SpO2 97%   BMI 26.48 kg/m  Physical Exam Vitals and nursing note reviewed.  Constitutional:      General: He is not in acute distress.    Appearance: Normal appearance. He is well-developed. He is not ill-appearing or diaphoretic.  HENT:     Head: Normocephalic and atraumatic.  Eyes:     General: No visual field deficit.    Extraocular  Movements: Extraocular movements intact.     Conjunctiva/sclera: Conjunctivae normal.     Pupils: Pupils are equal, round, and reactive to light.  Cardiovascular:     Rate and Rhythm: Normal rate and regular rhythm.     Pulses: Normal pulses.     Heart sounds: Normal heart sounds. No murmur heard.    No friction rub. No gallop.  Pulmonary:     Effort: Pulmonary effort is normal. No respiratory distress.     Breath sounds: Normal breath sounds. No wheezing or rales.  Abdominal:     General: There is no distension.     Palpations: Abdomen is soft.     Tenderness: There is no abdominal tenderness. There is no guarding.  Musculoskeletal:        General: No swelling or tenderness.     Cervical back: Normal range of motion.  Skin:    General: Skin is warm and dry.     Findings: No erythema or rash.  Neurological:     General: No focal deficit present.     Mental Status: He is alert and oriented to person, place, and time.     GCS: GCS eye subscore is 4. GCS verbal subscore is 5. GCS motor subscore is 6.     Cranial Nerves: No cranial nerve deficit, dysarthria or facial asymmetry.     Sensory: Sensory deficit (left sided (since prior cva)) present.     Motor: No weakness or tremor.     Coordination: Coordination normal. Finger-Nose-Finger Test normal.     Gait: Gait normal.     Comments: Difficulty left upper visual fields (since prior cva)      ED Results / Procedures / Treatments   Labs (all labs ordered are listed, but only abnormal results are displayed) Labs Reviewed  BASIC METABOLIC PANEL - Abnormal; Notable for the following components:      Result Value   CO2 21 (*)    Glucose, Bld 235 (*)    All other components within normal limits  CBC WITH DIFFERENTIAL/PLATELET - Abnormal; Notable for the following components:   Hemoglobin 12.7 (*)    HCT 38.6 (*)    Neutro Abs 8.8 (*)    All other components within normal limits  GLUCOSE, CAPILLARY - Abnormal; Notable for the  following components:   Glucose-Capillary 146 (*)    All other components within normal limits  GLUCOSE, CAPILLARY - Abnormal; Notable for the following components:   Glucose-Capillary 129 (*)    All other components within normal limits  CBG MONITORING, ED - Abnormal; Notable for the following components:   Glucose-Capillary 220 (*)    All other components within normal limits  MRSA NEXT GEN BY PCR,  NASAL  TROPONIN I (HIGH SENSITIVITY)  TROPONIN I (HIGH SENSITIVITY)    EKG EKG Interpretation  Date/Time:  Tuesday March 19 2022 20:51:24 EST Ventricular Rate:  59 PR Interval:  140 QRS Duration: 94 QT Interval:  452 QTC Calculation: 447 R Axis:   -51 Text Interpretation: Sinus bradycardia Left anterior fascicular block Moderate voltage criteria for LVH, may be normal variant ( R in aVL , Cornell product ) Anteroseptal infarct , age undetermined ST & T wave abnormality, consider lateral ischemia Abnormal ECG Confirmed by Quintella Reichert [phone]) on 03/19/2022 11:15:05 PM  Radiology CT HEAD WO CONTRAST (5MM)  Result Date: 03/20/2022 CLINICAL DATA:  Stroke, hemorrhagic. EXAM: CT HEAD WITHOUT CONTRAST TECHNIQUE: Contiguous axial images were obtained from the base of the skull through the vertex without intravenous contrast. RADIATION DOSE REDUCTION: This exam was performed according to the departmental dose-optimization program which includes automated exposure control, adjustment of the mA and/or kV according to patient size and/or use of iterative reconstruction technique. COMPARISON:  Head CT 03/19/2022 FINDINGS: Brain: The known large hemorrhagic right PCA infarct and associated edema are unchanged. A subacute right thalamic infarct and chronic anterior left MCA infarct are again noted. No midline shift, new infarct, new intracranial hemorrhage, hydrocephalus, or extra-axial fluid collection is identified. There is mild cerebral atrophy. Vascular: Calcified atherosclerosis at the skull  base. Skull: No acute fracture or suspicious osseous lesion. Sinuses/Orbits: Visualized paranasal sinuses and mastoid air cells are clear. Unremarkable orbits. Other: None. IMPRESSION: 1. Unchanged large hemorrhagic right PCA infarct. 2. No new intracranial abnormality. Electronically Signed   By: Logan Bores M.D.   On: 03/20/2022 08:26   CT HEAD WO CONTRAST (5MM)  Result Date: 03/19/2022 CLINICAL DATA:  Dizziness, recent stroke EXAM: CT HEAD WITHOUT CONTRAST TECHNIQUE: Contiguous axial images were obtained from the base of the skull through the vertex without intravenous contrast. RADIATION DOSE REDUCTION: This exam was performed according to the departmental dose-optimization program which includes automated exposure control, adjustment of the mA and/or kV according to patient size and/or use of iterative reconstruction technique. COMPARISON:  03/02/2022 CT head FINDINGS: Brain: Hyperdensity involving the medial right temporal and occipital lobe (series 4, image 12, which correlates with the area of recent infarct seen on 03/02/2022. This causes mild local mass effect on the right lateral ventricle, without evidence of entrapment of the right temporal horn or occipital horn. No additional area of acute infarct. No other evidence of hemorrhage. No mass, additional mass effect, or midline shift. No hydrocephalus or extra-axial collection. Redemonstrated remote infarct in the anterior left frontal lobe. Vascular: No hyperdense vessel. Skull: No acute osseous abnormality. Sinuses/Orbits: Minimal mucosal thickening in the left maxillary sinus. The orbits are unremarkable. Other: The mastoids are well aerated. IMPRESSION: 1. Acute hemorrhage involving the medial right temporal and occipital lobe, which correlates with the area of recent infarct seen on 03/02/2022, likely hemorrhagic transformation. This causes mild local mass effect on the right lateral ventricle, without evidence of entrapment or midline shift. 2.  No other acute intracranial process. These findings were discussed by telephone on 03/19/2022 at 11:34 pm with provider Dr. Lorrin Goodell. Electronically Signed   By: Merilyn Baba M.D.   On: 03/19/2022 23:53   DG Chest Port 1 View  Result Date: 03/19/2022 CLINICAL DATA:  Presyncope, dizziness. EXAM: PORTABLE CHEST 1 VIEW COMPARISON:  03/21/2021. FINDINGS: Heart is enlarged and the mediastinal contour is within normal limits. Interstitial prominence is noted at the lung bases bilaterally. No consolidation, effusion, or pneumothorax.  Cervical spinal fusion hardware is noted. No acute osseous abnormality. IMPRESSION: 1. Cardiomegaly. 2. Persistent interstitial thickening at the lung bases. Electronically Signed   By: Brett Fairy M.D.   On: 03/19/2022 21:23    Procedures .Critical Care  Performed by: Gareth Morgan, MD Authorized by: Gareth Morgan, MD   Critical care provider statement:    Critical care time (minutes):  30   Critical care was time spent personally by me on the following activities:  Development of treatment plan with patient or surrogate, discussions with consultants, evaluation of patient's response to treatment, examination of patient, ordering and review of laboratory studies, ordering and review of radiographic studies, ordering and performing treatments and interventions, pulse oximetry, re-evaluation of patient's condition and review of old charts     Medications Ordered in ED Medications  acetaminophen (TYLENOL) tablet 650 mg (has no administration in time range)    Or  acetaminophen (TYLENOL) 160 MG/5ML solution 650 mg (has no administration in time range)    Or  acetaminophen (TYLENOL) suppository 650 mg (has no administration in time range)  senna-docusate (Senokot-S) tablet 1 tablet (1 tablet Oral Not Given 03/20/22 0856)  pantoprazole (PROTONIX) injection 40 mg (40 mg Intravenous Given 03/20/22 0041)  rosuvastatin (CRESTOR) tablet 20 mg (20 mg Oral Given 03/20/22  0938)  insulin aspart (novoLOG) injection 0-15 Units (2 Units Subcutaneous Given 03/20/22 0848)  Chlorhexidine Gluconate Cloth 2 % PADS 6 each (has no administration in time range)  Oral care mouth rinse (has no administration in time range)  irbesartan (AVAPRO) tablet 300 mg (300 mg Oral Given 03/20/22 1011)  carvedilol (COREG) tablet 6.25 mg (has no administration in time range)  labetalol (NORMODYNE) injection 10 mg (10 mg Intravenous Given 03/19/22 2338)   stroke: early stages of recovery book ( Does not apply Given 03/20/22 2947)    ED Course/ Medical Decision Making/ A&P                              62 y.o. male with PMH significant for CAD, previous cardiogenic shock from anterior STEMI (on Brilinta), CHF, HTN, HLD, recent R PCA stroke without hemorrhage and discharged om Aspirin '81mg'$  daily along with Brilinta 90 BID with residual numbness to left side presents with concern for episode of feeling off balance.  Evaluated Mr. Bogard in Triage and discussed presentation with Dr. Lorrin Goodell Neurology.  Initially ordered CTA along with labs with concern for repeat TIA/CVA. Determined not to be tnk candidate due to recent CVA, and not intervention candidate due to no significant symptoms however will evaluate for changes/occlusion given waxing and waning symptoms.   Called by CT tech and notified of [place]. Personally evaluated the CT head images and interpreted in CT>  Will cancel CTA at this time as suspect hemorrhagic conversion of prior CVA.  Consulted Dr. Osvaldo Shipper regarding [place].  He is ordering medication for blood pressure, will admit to ICU for continued monitoring. Updated wife and patient at bedside.    EKG personallly evaluated by with TW changes, likely in setting of ICH. Troponin normal. Labs personally interpreted by me show no other significant findings, mild hyperglycemia.        Final Clinical Impression(s) / ED Diagnoses Final diagnoses:  Nontraumatic subcortical  hemorrhage of right cerebral hemisphere [facility])    Rx / DC Orders ED Discharge Orders     None         Gareth Morgan, MD 03/20/22 1127

## 2022-03-19 NOTE — H&P (Addendum)
NEUROLOGY CONSULTATION NOTE   Date of service: March 19, 2022 Patient Name: Zachary Flynn MRN:  [id number] DOB:  Dec 21, 1960  _ _ _   _ __   _ __ _ _  __ __   _ __   __ _  History of Present Illness  RODARIUS KICHLINE is a 62 y.o. male with PMH significant for CAD, cardiogenic shock from anterior STEMI (on Brilinta), CHF, HTN, HLD, recent cryptogenic R PCA stroke without hemorrhage and discharged om Aspirin '81mg'$  daily along with Brilinta 90 BID, left sided numbness residual from recent stroke who presents with 5 mins episode of dizziness. He got up from the kitchen table nd had sudden onset diziness but this also felt very similar to the stroke he had 2 weeks ago so he came to the ED.  CT Head without contrast demonstrates hemorrhagic transformation of the R PCA stroke.  LKW: 03/02/22 mRS: 0 ICH score: 0 tNKASE: not offered 2/2 ICH and recent stroke outside the window. Thrombectomy: not offered 2/2 ICH and recent stroke, outside the window. NIHSS components Score: Comment  1a Level of Conscious 0'[x]'$  1'[]'$  2'[]'$  3'[]'$      1b LOC Questions 0'[x]'$  1'[]'$  2'[]'$       1c LOC Commands 0'[x]'$  1'[]'$  2'[]'$       2 Best Gaze 0'[x]'$  1'[]'$  2'[]'$       3 Visual 0'[x]'$  1'[]'$  2'[]'$  3'[]'$      4 Facial Palsy 0'[x]'$  1'[]'$  2'[]'$  3'[]'$      5a Motor Arm - left 0'[x]'$  1'[]'$  2'[]'$  3'[]'$  4'[]'$  UN'[]'$    5b Motor Arm - Right 0'[x]'$  1'[]'$  2'[]'$  3'[]'$  4'[]'$  UN'[]'$    6a Motor Leg - Left 0'[x]'$  1'[]'$  2'[]'$  3'[]'$  4'[]'$  UN'[]'$    6b Motor Leg - Right 0'[x]'$  1'[]'$  2'[]'$  3'[]'$  4'[]'$  UN'[]'$    7 Limb Ataxia 0'[x]'$  1'[]'$  2'[]'$  3'[]'$  UN'[]'$     8 Sensory 0'[]'$  1'[x]'$  2'[]'$  UN'[]'$      9 Best Language 0'[x]'$  1'[]'$  2'[]'$  3'[]'$      10 Dysarthria 0'[x]'$  1'[]'$  2'[]'$  UN'[]'$      11 Extinct. and Inattention 0'[x]'$  1'[]'$  2'[]'$       TOTAL: 1       ROS   Constitutional Denies weight loss, fever and chills.   HEENT Denies changes in vision and hearing.   Respiratory Denies SOB and cough.   CV Denies palpitations and CP   GI Denies abdominal pain, nausea, vomiting and diarrhea.   GU Denies dysuria and urinary frequency.   MSK Denies myalgia and joint  pain.   Skin Denies rash and pruritus.   Neurological Denies headache and syncope.   Psychiatric Denies recent changes in mood. Denies anxiety and depression.    Past History   Past Medical History:  Diagnosis Date   CAD (coronary artery disease) 12/02/2018   a. 100% proximal LAD insetting of anterior STEMI--PCI: Resolute DES 3.0 mm x 22 mm (postdilated 3.5-3.1 mm).  Also 50% proximal RI.  Left dominant system.  Normal EF by LV gram.   Cardiogenic shock ([place]) 12/2018   Mild shock complicating anterior STEMI; did not require mechanical support   Cervical disc disease    Chronic diastolic CHF (congestive heart failure), NYHA class 1 ([place]) 11/2018   a. LVEDP elevated at time of MI ->; GRII DD on follow-up echo   Erectile dysfunction    Essential hypertension 12/02/2018   Hyperlipidemia with target LDL less than 70 12/02/2018   Mild dilation of ascending aorta ([place])    a. by echo 12/2018.   Pre-diabetes  Past Surgical History:  Procedure Laterality Date   CERVICAL SPINE SURGERY  2012   Dr. Drema Pry ACUTE MI REVASCULARIZATION N/A 12/02/2018   Procedure: Coronary/Graft Acute MI Revascularization-CORONARY STENT INTERVENTION;  Surgeon: [name], MD;  Location: [facility];  Service: Cardiovascular;; p-mLAD 100% w/ 50% D1 ostial dz ( PCI - Resolute Onyx DES 3.0 mm x 32 mm --postdilated 3.5-3.1 mm).   LEFT HEART CATH AND CORONARY ANGIOGRAPHY N/A 12/02/2018   Procedure: LEFT HEART CATH AND CORONARY ANGIOGRAPHY;  Surgeon: [name], MD;  Location: [facility]; INDICATION-ANTERIOR STEMI: p-mLAD 100% w/ 50% D1 ostial dz (DES PCI).  Moderate RI 55 %.  EF 55 to 60% (preliminary LV gram showed anterior hypokinesis, resolved following PCI).  Severely elevated LVEDP of 29 mmHg (acute diasto   REVISION TOTAL HIP ARTHROPLASTY Left 2003   TOTAL HIP ARTHROPLASTY Left 2000   Dr. Durward Fortes   TRANSTHORACIC ECHOCARDIOGRAM  12/02/2018   (In setting of anterior STEMI)  EF 50-55%.  Apical septal and apical anterior akinesis.  Moderate LVH.  GR 1 DD.  Normal LA and RA size.  No valvular disease.  Mildly dilated ascending aorta of 39 mm.   TRANSTHORACIC ECHOCARDIOGRAM  11/24/2019   EF 45 to 50%.  Mildly decreased function.  Hypokinesis of the mid-apical anteroseptal and apical wall.  GRII DD.  (Pseudonormalization).  Normal RV size and function.  Mildly dilated ascending aorta 43 mm.  Normal CVP.   Family History  Problem Relation Age of Onset   CAD Mother    Heart attack Mother    CAD Sister    Heart attack Sister    Colon cancer Neg Hx    Stomach cancer Neg Hx    Pancreatic cancer Neg Hx    Esophageal cancer Neg Hx    Social History   Socioeconomic History   Marital status: Married    Spouse name: Not on file   Number of children: Not on file   Years of education: Not on file   Highest education level: Not on file  Occupational History   Occupation: Engineer, structural    Employer: [employer]    Comment: Lieutenant  Tobacco Use   Smoking status: Never   Smokeless tobacco: Never  Vaping Use   Vaping Use: Never used  Substance and Sexual Activity   Alcohol use: Never   Drug use: Never   Sexual activity: Not on file  Other Topics Concern   Not on file  Social History Narrative   Not on file   Social Determinants of Health   Financial Resource Strain: Not on file  Food Insecurity: Not on file  Transportation Needs: Not on file  Physical Activity: Not on file  Stress: Not on file  Social Connections: Not on file   Allergies  Allergen Reactions   Food     Lactose intolerant, no dairy products   Lisinopril Cough   Pollen Extract     Medications  (Not in a hospital admission)    Vitals   Vitals:   03/19/22 2026 03/19/22 2043 03/19/22 2320 03/19/22 2321  BP:   (!) 155/79   Pulse:    68  Resp:   (!) 22 13  Temp:      TempSrc:      SpO2: 98%   98%  Weight:  76.2 kg    Height:  '5\' 8"'$  (1.727 m)       Body mass  index is 25.54 kg/m.  Physical Exam   General: Laying comfortably in bed; in no acute distress.  HENT: Normal oropharynx and mucosa. Normal external appearance of ears and nose.  Neck: Supple, no pain or tenderness  CV: No JVD. No peripheral edema. Pulmonary: Symmetric Chest rise. Normal respiratory effort.  Abdomen: Soft to touch, non-tender.  Ext: No cyanosis, edema, or deformity  Skin: No rash. Normal palpation of skin.   Musculoskeletal: Normal digits and nails by inspection. No clubbing.   Neurologic Examination  Mental status/Cognition: Alert, oriented to self, place, month and year, good attention.  Speech/language: Fluent, comprehension intact, object naming intact, repetition intact.  Cranial nerves:   CN II Pupils equal and reactive to light, no VF deficits    CN III,IV,VI EOM intact, no gaze preference or deviation, no nystagmus    CN V normal sensation in V1, V2, and V3 segments bilaterally    CN VII no asymmetry, no nasolabial fold flattening   CN VIII normal hearing to speech    CN IX & X normal palatal elevation, no uvular deviation    CN XI 5/5 head turn and 5/5 shoulder shrug bilaterally    CN XII midline tongue protrusion    Motor:  Muscle bulk: normal, tone normal, pronator drift none tremor none Mvmt Root Nerve  Muscle Right Left Comments  SA C5/6 Ax Deltoid 5 5   EF C5/6 Mc Biceps 5 5   EE C6/7/8 Rad Triceps 5 5   WF C6/7 Med FCR     WE C7/8 PIN ECU     F Ab C8/T1 U ADM/FDI 5 5   HF L1/2/3 Fem Illopsoas 5 5   KE L2/3/4 Fem Quad 5 5   DF L4/5 D Peron Tib Ant 5 5   PF S1/2 Tibial Grc/Sol 5 5    Sensation:  Light touch Decreased to touch in left lower face, left upper extremity.   Pin prick    Temperature    Vibration   Proprioception    Coordination/Complex Motor:  - Finger to Nose intact BL - Heel to shin intact BL - Rapid alternating movement are normal - Gait: Deferred Labs   CBC:  Recent Labs  Lab 03/19/22 2120  WBC 10.5  NEUTROABS  8.8*  HGB 12.7*  HCT 38.6*  MCV 90.6  PLT 892    Basic Metabolic Panel:  Lab Results  Component Value Date   NA 136 03/19/2022   K 4.1 03/19/2022   CO2 21 (L) 03/19/2022   GLUCOSE 235 (H) 03/19/2022   BUN 17 03/19/2022   CREATININE 1.14 03/19/2022   CALCIUM 9.2 03/19/2022   GFRNONAA >60 03/19/2022   GFRAA 87 07/05/2019   Lipid Panel:  Lab Results  Component Value Date   LDLCALC 57 03/02/2022   HgbA1c:  Lab Results  Component Value Date   HGBA1C 6.8 (H) 02/19/2022   Urine Drug Screen: No results found for: "LABOPIA", "COCAINSCRNUR", "LABBENZ", "AMPHETMU", "THCU", "LABBARB"  Alcohol Level     Component Value Date/Time   ETH <10 03/02/2022 0726    CT Head without contrast(Personally reviewed): Acute hemorrhage involving the medial right temporal and occipital lobe, which correlates with the area of recent infarct seen on 03/02/2022, likely hemorrhagic transformation. This causes mild local mass effect on the right lateral ventricle, without evidence of entrapment or midline shift.  MRI Brain: Pending   Impression   OSIEL STICK is a 62 y.o. male with PMH significant for CAD, cardiogenic shock from  anterior STEMI (on Brilinta), CHF, HTN, HLD, recent cryptogenic R PCA stroke without hemorrhage and discharged om Aspirin '81mg'$  daily along with Brilinta 90 BID, left sided numbness residual from recent stroke who presents with 5 mins episode of dizziness. He got up from the kitchen table nd had sudden onset diziness but this also felt very similar to the stroke he had 2 weeks ago so he came to the ED.  CT Head without contrast demonstrates hemorrhagic transformation of the R PCA stroke withg mild localized mass effect. His neurologic examination is notable for mild L sided numbness.  Recommendations  Acute hemorrhagic transformation of the recent R PCA stroke with brain compression: - Admit to ICU - Stability scan in 6 hours or STAT with any neurological decline -  Frequent neuro checks; q25mn for 1 hour, then q1hour - No antiplatelets or anticoagulants due to IPioneer- SCD for DVT prophylaxis, pharmacological DVT ppx at 24 hours if ICH is stable - Blood pressure control with goal systolic 1449- 1201 cleverplex and labetalol PRN - Stroke labs, HgbA1c, fasting lipid panel - MRI brain with and without contrast when stabilized to evaluate for underlying mass - MRA without contrast of the brain and Vasc UKoreacarotid duplex to evaluate for underlying vascular abnormality. - Risk factor modification - Echocardiogram - PT consult, OT consult, Speech consult. - Stroke team to follow  DM2: - sliding scale insulin.  CAD: - hold Asa and Brilinta in the setting of hemorrhagic transformation of acute stroke.  Hypertension: -Goal SBP as above.  Will use Cleviprex and labetalol as needed for blood pressure above goal.  Hyperlipidemia Continue home Crestor.  This patient is critically ill and at significant risk of neurological worsening, death and care requires constant monitoring of vital signs, hemodynamics,respiratory and cardiac monitoring, neurological assessment, discussion with family, other specialists and medical decision making of high complexity. I spent 40 minutes of neurocritical care time  in the care of  this patient. This was time spent independent of any time provided by nurse practitioner or PA.  SParadisePager Number [phone]/17/2024  12:07 AM ______________________________________________________________________   Thank you for the opportunity to take part in the care of this patient. If you have any further questions, please contact the neurology consultation attending.  Signed,  SCheswoldPager Number [phone]_ _ _   _ __   _ __ _ _  __ __   _ __   __ _

## 2022-03-19 NOTE — ED Notes (Signed)
Pt had another episode of dizziness. MD, PA, and RN at bedside. Repeat EKG done.

## 2022-03-19 NOTE — ED Triage Notes (Signed)
Pt bib [facility] for episode of dizziness at 1830 that lasted approx 5 minutes. Pt has left sided numbness from previous stroke on 12/29. Negative stroke screen with ems. Negative orthostatics. VSS w/ems.

## 2022-03-19 NOTE — ED Notes (Signed)
Neurologist at bedside. 

## 2022-03-20 ENCOUNTER — Inpatient Hospital Stay (HOSPITAL_COMMUNITY): Payer: 59

## 2022-03-20 DIAGNOSIS — I611 Nontraumatic intracerebral hemorrhage in hemisphere, cortical: Secondary | ICD-10-CM | POA: Diagnosis not present

## 2022-03-20 LAB — GLUCOSE, CAPILLARY
Glucose-Capillary: 146 mg/dL — ABNORMAL HIGH (ref 70–99)
Glucose-Capillary: 129 mg/dL — ABNORMAL HIGH (ref 70–99)
Glucose-Capillary: 170 mg/dL — ABNORMAL HIGH (ref 70–99)

## 2022-03-20 LAB — TROPONIN I (HIGH SENSITIVITY): Troponin I (High Sensitivity): 3 ng/L (ref <18)

## 2022-03-20 LAB — MRSA NEXT GEN BY PCR, NASAL: MRSA by PCR Next Gen: NOT DETECTED

## 2022-03-20 MED ORDER — INSULIN ASPART 100 UNIT/ML IJ SOLN
0.0000 [IU] | Freq: Three times a day (TID) | INTRAMUSCULAR | Status: DC
  Administered 2022-03-20: 3 [IU] via SUBCUTANEOUS
  Administered 2022-03-20 (×2): 2 [IU] via SUBCUTANEOUS
  Administered 2022-03-21: 3 [IU] via SUBCUTANEOUS
  Administered 2022-03-21: 2 [IU] via SUBCUTANEOUS

## 2022-03-20 MED ORDER — ORAL CARE MOUTH RINSE: 15.0000 mL | OROMUCOSAL | Status: DC | PRN

## 2022-03-20 MED ORDER — IRBESARTAN 300 MG PO TABS
300.0000 mg | ORAL_TABLET | Freq: Every day | ORAL | Status: DC
  Administered 2022-03-20 – 2022-03-21 (×2): 300 mg via ORAL
  Filled 2022-03-20: qty 1
  Filled 2022-03-20: qty 2

## 2022-03-20 MED ORDER — ROSUVASTATIN CALCIUM 20 MG PO TABS
20.0000 mg | ORAL_TABLET | Freq: Every day | ORAL | Status: DC
  Administered 2022-03-20 – 2022-03-21 (×2): 20 mg via ORAL
  Filled 2022-03-20 (×2): qty 1

## 2022-03-20 MED ORDER — CHLORHEXIDINE GLUCONATE CLOTH 2 % EX PADS
6.0000 | MEDICATED_PAD | Freq: Every day | CUTANEOUS | Status: DC
  Administered 2022-03-20: 6 via TOPICAL

## 2022-03-20 MED ORDER — CARVEDILOL 6.25 MG PO TABS
6.2500 mg | ORAL_TABLET | Freq: Two times a day (BID) | ORAL | Status: DC
  Administered 2022-03-20 – 2022-03-21 (×2): 6.25 mg via ORAL
  Filled 2022-03-20 (×2): qty 1

## 2022-03-20 NOTE — Plan of Care (Signed)
  Problem: Education: Goal: Knowledge of disease or condition will improve Outcome: Progressing   Problem: Coping: Goal: Will identify appropriate support needs Outcome: Progressing   Problem: Health Behavior/Discharge Planning: Goal: Ability to manage health-related needs will improve Outcome: Progressing   Problem: Self-Care: Goal: Ability to communicate needs accurately will improve Outcome: Progressing   Problem: Nutrition: Goal: Risk of aspiration will decrease Outcome: Progressing Goal: Dietary intake will improve Outcome: Progressing   Problem: Intracerebral Hemorrhage Tissue Perfusion: Goal: Complications of Intracerebral Hemorrhage will be minimized Outcome: Progressing

## 2022-03-20 NOTE — Progress Notes (Signed)
Received pt from the ED, alert and oriented, accompanied by his wife, at the bedside are his wallet, eyeglasses, phone and clothes

## 2022-03-20 NOTE — TOC CAGE-AID Note (Signed)
Transition of Care [facility]) - CAGE-AID Screening   Patient Details  Name: Zachary Flynn MRN: [id number] Date of Birth: 1960-12-27  Transition of Care [facility]) CM/SW Contact:    Bethann Berkshire, [place] Phone Number: 03/20/2022, 11:42 AM   Clinical Narrative:  Pt reports he does not use alcohol or any other substances.   CAGE-AID Screening:    Have You Ever Felt You Ought to Cut Down on Your Drinking or Drug Use?: No Have People Annoyed You By Critizing Your Drinking Or Drug Use?: No Have You Felt Bad Or Guilty About Your Drinking Or Drug Use?: No Have You Ever Had a Drink or Used Drugs First Thing In The Morning to Steady Your Nerves or to Get Rid of a Hangover?: No CAGE-AID Score: 0  Substance Abuse Education Offered: No

## 2022-03-20 NOTE — Progress Notes (Signed)
OT Cancellation Note  Patient Details Name: Zachary Flynn MRN: [id number] DOB: 1960/04/16   Cancelled Treatment:    Reason Eval/Treat Not Completed: Active bedrest order. Will assess when activity orders updated.   Jerrie Schussler,HILLARY 03/20/2022, 8:21 AM Maurie Boettcher, OT/L   Acute OT Clinical Specialist Acute Rehabilitation Services Pager [phone] Office [phone]

## 2022-03-20 NOTE — Progress Notes (Signed)
  Transition of Care [facility]) Screening Note   Patient Details  Name: Zachary Flynn Date of Birth: 01-01-1961   [facility] of [facility]) CM/SW Contact:    Benard Halsted, LCSW Phone Number: 03/20/2022, 9:25 AM    Transition of Care Department [facility]) has reviewed patient. We will continue to monitor patient advancement through interdisciplinary progression rounds. If new patient transition needs arise, please place a TOC consult.

## 2022-03-20 NOTE — Evaluation (Signed)
Physical Therapy Evaluation and DISCHARGE Patient Details Name: Zachary Flynn MRN: [id number] DOB: 12-04-1960 Today's Date: 03/20/2022  History of Present Illness  Zachary Flynn is a 62 y.o. male who presented to ED with R min episode of dizziness that resembled sensation when he had his CVA 2 weeks ago. CT revealed hemorrhagic transformation of R PCA stroke at temporal and occipital lobes. PMH significant for CAD, cardiogenic shock from anterior STEMI (on Brilinta), CHF, HTN, HLD, recent (12/30) cryptogenic R PCA stroke without hemorrhage with residual left sided numbness   Clinical Impression  Pt admitted with above. All symptoms have resolved with exception of mild L thumb and first finger tingling. Pt at low fall risk as indicated by score of 23/24 on DGI. Pt functioning at indep level and is safe to mobilize with spouse around unit. Pt educated on "BE FAST". Pt with good recall from previous admission 2 weeks ago. Pt with no further acute PT needs at this time. PT SIGNING OFF. Please re-consult if needed in future.       Recommendations for follow up therapy are one component of a multi-disciplinary discharge planning process, led by the attending physician.  Recommendations may be updated based on patient status, additional functional criteria and insurance authorization.  Follow Up Recommendations No PT follow up      Assistance Recommended at Discharge None  Patient can return home with the following       Equipment Recommendations None recommended by PT  Recommendations for Other Services       Functional Status Assessment Patient has had a recent decline in their functional status and demonstrates the ability to make significant improvements in function in a reasonable and predictable amount of time.     Precautions / Restrictions Precautions Precautions: None Restrictions Weight Bearing Restrictions: No      Mobility  Bed Mobility               General bed  mobility comments: pt up in chair, RN reports no difficulty    Transfers Overall transfer level: Independent Equipment used: None               General transfer comment: per RN pt ambulating in hallways with spouse    Ambulation/Gait Ambulation/Gait assistance: Independent   Assistive device: None Gait Pattern/deviations: WFL(Within Functional Limits) Gait velocity: wfl Gait velocity interpretation: >4.37 ft/sec, indicative of normal walking speed   General Gait Details: no difficulty, no episode of LOB  Stairs Stairs: Yes Stairs assistance: Modified independent (Device/Increase time) Stair Management: One rail Right, Alternating pattern Number of Stairs: 12 General stair comments: no difficulty, used R HR on the way up but nothing on the way down  Wheelchair Mobility    Modified Rankin (Stroke Patients Only) Modified Rankin (Stroke Patients Only) Pre-Morbid Rankin Score: No significant disability Modified Rankin: No significant disability     Balance Overall balance assessment: Mild deficits observed, not formally tested                               Standardized Balance Assessment Standardized Balance Assessment : Dynamic Gait Index   Dynamic Gait Index Level Surface: Normal Change in Gait Speed: Normal Gait with Horizontal Head Turns: Normal Gait with Vertical Head Turns: Normal Gait and Pivot Turn: Normal Step Over Obstacle: Normal Step Around Obstacles: Normal Steps: Mild Impairment Total Score: 23       Pertinent Vitals/Pain Pain Assessment Pain  Assessment: No/denies pain    Home Living Family/patient expects to be discharged to:: Private residence Living Arrangements: Spouse/significant other Available Help at Discharge: Family;Available PRN/intermittently Type of Home: House Home Access: Stairs to enter Entrance Stairs-Rails: Right Entrance Stairs-Number of Steps: 3   Home Layout: One level Home Equipment: None       Prior Function Prior Level of Function : Independent/Modified Independent;Driving;Working/employed Licensed conveyancer)             Mobility Comments: no AD use, was suppose to return to work this upcoming monday ADLs Comments: indep     Hand Dominance   Dominant Hand: Left    Extremity/Trunk Assessment   Upper Extremity Assessment LUE Deficits / Details: residual numbness/tingling in thumb and first finger, MMT 5/5    Lower Extremity Assessment Lower Extremity Assessment: Overall WFL for tasks assessed    Cervical / Trunk Assessment Cervical / Trunk Assessment: Normal  Communication   Communication: No difficulties  Cognition Arousal/Alertness: Awake/alert Behavior During Therapy: WFL for tasks assessed/performed Overall Cognitive Status: Within Functional Limits for tasks assessed                                          General Comments General comments (skin integrity, edema, etc.): VSS on RA    Exercises     Assessment/Plan    PT Assessment Patient does not need any further PT services  PT Problem List         PT Treatment Interventions      PT Goals (Current goals can be found in the Care Plan section)  Acute Rehab PT Goals Patient Stated Goal: go home today PT Goal Formulation: All assessment and education complete, DC therapy    Frequency       Co-evaluation               AM-PAC PT "6 Clicks" Mobility  Outcome Measure Help needed turning from your back to your side while in a flat bed without using bedrails?: None Help needed moving from lying on your back to sitting on the side of a flat bed without using bedrails?: None Help needed moving to and from a bed to a chair (including a wheelchair)?: None Help needed standing up from a chair using your arms (e.g., wheelchair or bedside chair)?: None Help needed to walk in hospital room?: None Help needed climbing 3-5 steps with a railing? : None 6 Click Score: 24    End  of Session   Activity Tolerance: Patient tolerated treatment well Patient left: in chair;with call bell/phone within reach;with family/visitor present Nurse Communication: Mobility status (cleared to amb in hallway with spouse) PT Visit Diagnosis: Other symptoms and signs involving the nervous system (R29.898)    Time: 1601-0932 PT Time Calculation (min) (ACUTE ONLY): 16 min   Charges:   PT Evaluation $PT Eval Low Complexity: 1 Low          Kittie Plater, PT, DPT Acute Rehabilitation Services Secure chat preferred Office #: [phone]   Berline Lopes 03/20/2022, 9:40 AM

## 2022-03-20 NOTE — Progress Notes (Signed)
OT Screen Note  Patient Details Name: Zachary Flynn MRN: [id number] DOB: [birth year]   Cancelled Treatment:    Reason Eval/Treat Not Completed: OT screened, no needs identified, will sign off Screen by PT Ashly with baseline at this time.   Jeri Modena 03/20/2022, 10:13 AM

## 2022-03-20 NOTE — H&P (Incomplete)
NEUROLOGY CONSULTATION NOTE   Date of service: March 19, 2022 Patient Name: Zachary Flynn MRN:  [id number] DOB:  27-Jul-1960  _ _ _   _ __   _ __ _ _  __ __   _ __   __ _  History of Present Illness  Zachary Flynn is a 62 y.o. male with PMH significant for CAD, cardiogenic shock from anterior STEMI (on Brilinta), CHF, HTN, HLD, recent cryptogenic R PCA stroke without hemorrhage and discharged om Aspirin '81mg'$  daily along with Brilinta 90 BID, left sided numbness residual from recent stroke who presents with 5 mins episode of dizziness. He got up from the kitchen table nd had sudden onset diziness but this also felt very similar to the stroke he had 2 weeks ago so he came to the ED.  CT Head without contrast demonstrates hemorrhagic transformation of the R PCA stroke.  LKW: 03/02/22 mRS: 0 ICH score: 0 tNKASE: not offered 2/2 ICH and recent stroke outside the window. Thrombectomy: not offered 2/2 ICH and recent stroke, outside the window. NIHSS components Score: Comment  1a Level of Conscious 0'[x]'$  1'[]'$  2'[]'$  3'[]'$      1b LOC Questions 0'[x]'$  1'[]'$  2'[]'$       1c LOC Commands 0'[x]'$  1'[]'$  2'[]'$       2 Best Gaze 0'[x]'$  1'[]'$  2'[]'$       3 Visual 0'[x]'$  1'[]'$  2'[]'$  3'[]'$      4 Facial Palsy 0'[x]'$  1'[]'$  2'[]'$  3'[]'$      5a Motor Arm - left 0'[x]'$  1'[]'$  2'[]'$  3'[]'$  4'[]'$  UN'[]'$    5b Motor Arm - Right 0'[x]'$  1'[]'$  2'[]'$  3'[]'$  4'[]'$  UN'[]'$    6a Motor Leg - Left 0'[x]'$  1'[]'$  2'[]'$  3'[]'$  4'[]'$  UN'[]'$    6b Motor Leg - Right 0'[x]'$  1'[]'$  2'[]'$  3'[]'$  4'[]'$  UN'[]'$    7 Limb Ataxia 0'[x]'$  1'[]'$  2'[]'$  3'[]'$  UN'[]'$     8 Sensory 0'[]'$  1'[x]'$  2'[]'$  UN'[]'$      9 Best Language 0'[x]'$  1'[]'$  2'[]'$  3'[]'$      10 Dysarthria 0'[x]'$  1'[]'$  2'[]'$  UN'[]'$      11 Extinct. and Inattention 0'[x]'$  1'[]'$  2'[]'$       TOTAL: 1       ROS   Constitutional Denies weight loss, fever and chills.   HEENT Denies changes in vision and hearing.   Respiratory Denies SOB and cough.   CV Denies palpitations and CP   GI Denies abdominal pain, nausea, vomiting and diarrhea.   GU Denies dysuria and urinary frequency.   MSK Denies myalgia and joint  pain.   Skin Denies rash and pruritus.   Neurological Denies headache and syncope.   Psychiatric Denies recent changes in mood. Denies anxiety and depression.    Past History   Past Medical History:  Diagnosis Date  . CAD (coronary artery disease) 12/02/2018   a. 100% proximal LAD insetting of anterior STEMI--PCI: Resolute DES 3.0 mm x 22 mm (postdilated 3.5-3.1 mm).  Also 50% proximal RI.  Left dominant system.  Normal EF by LV gram.  . Cardiogenic shock ([place]) 12/2018   Mild shock complicating anterior STEMI; did not require mechanical support  . Cervical disc disease   . Chronic diastolic CHF (congestive heart failure), NYHA class 1 ([place]) 11/2018   a. LVEDP elevated at time of MI ->; GRII DD on follow-up echo  . Erectile dysfunction   . Essential hypertension 12/02/2018  . Hyperlipidemia with target LDL less than 70 12/02/2018  . Mild dilation of ascending aorta ([place])    a. by echo 12/2018.  . Pre-diabetes  Past Surgical History:  Procedure Laterality Date  . CERVICAL SPINE SURGERY  2012   Dr. Ellene Route  . CORONARY/GRAFT ACUTE MI REVASCULARIZATION N/A 12/02/2018   Procedure: Coronary/Graft Acute MI Revascularization-CORONARY STENT INTERVENTION;  Surgeon: [name], MD;  Location: [facility];  Service: Cardiovascular;; p-mLAD 100% w/ 50% D1 ostial dz ( PCI - Resolute Onyx DES 3.0 mm x 32 mm --postdilated 3.5-3.1 mm).  . LEFT HEART CATH AND CORONARY ANGIOGRAPHY N/A 12/02/2018   Procedure: LEFT HEART CATH AND CORONARY ANGIOGRAPHY;  Surgeon: [name], MD;  Location: [facility]; INDICATION-ANTERIOR STEMI: p-mLAD 100% w/ 50% D1 ostial dz (DES PCI).  Moderate RI 55 %.  EF 55 to 60% (preliminary LV gram showed anterior hypokinesis, resolved following PCI).  Severely elevated LVEDP of 29 mmHg (acute diasto  . REVISION TOTAL HIP ARTHROPLASTY Left 2003  . TOTAL HIP ARTHROPLASTY Left 2000   Dr. Durward Fortes  . TRANSTHORACIC ECHOCARDIOGRAM  12/02/2018   (In setting of  anterior STEMI) EF 50-55%.  Apical septal and apical anterior akinesis.  Moderate LVH.  GR 1 DD.  Normal LA and RA size.  No valvular disease.  Mildly dilated ascending aorta of 39 mm.  . TRANSTHORACIC ECHOCARDIOGRAM  11/24/2019   EF 45 to 50%.  Mildly decreased function.  Hypokinesis of the mid-apical anteroseptal and apical wall.  GRII DD.  (Pseudonormalization).  Normal RV size and function.  Mildly dilated ascending aorta 43 mm.  Normal CVP.   Family History  Problem Relation Age of Onset  . CAD Mother   . Heart attack Mother   . CAD Sister   . Heart attack Sister   . Colon cancer Neg Hx   . Stomach cancer Neg Hx   . Pancreatic cancer Neg Hx   . Esophageal cancer Neg Hx    Social History   Socioeconomic History  . Marital status: Married    Spouse name: Not on file  . Number of children: Not on file  . Years of education: Not on file  . Highest education level: Not on file  Occupational History  . 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 Smoking status: Never  . Smokeless tobacco: Never  Vaping Use  . Vaping Use: Never used  Substance and Sexual Activity  . Alcohol use: Never  . Drug use: Never  . Sexual activity: Not on file  Other Topics Concern  . Not on file  Social History Narrative  . Not on file   Social Determinants of Health   Financial Resource Strain: Not on file  Food Insecurity: Not on file  Transportation Needs: Not on file  Physical Activity: Not on file  Stress: Not on file  Social Connections: Not on file   Allergies  Allergen Reactions  . Food     Lactose intolerant, no dairy products  . Lisinopril Cough  . Pollen Extract     Medications  (Not in a hospital admission)    Vitals   Vitals:   03/19/22 2026 03/19/22 2043 03/19/22 2320 03/19/22 2321  BP:   (!) 155/79   Pulse:    68  Resp:   (!) 22 13  Temp:      TempSrc:      SpO2: 98%   98%  Weight:  76.2 kg    Height:   '5\' 8"'$  (1.727 m)       Body mass  index is 25.54 kg/m.  Physical Exam   General: Laying comfortably in bed; in no acute distress.  HENT: Normal oropharynx and mucosa. Normal external appearance of ears and nose.  Neck: Supple, no pain or tenderness  CV: No JVD. No peripheral edema. Pulmonary: Symmetric Chest rise. Normal respiratory effort.  Abdomen: Soft to touch, non-tender.  Ext: No cyanosis, edema, or deformity  Skin: No rash. Normal palpation of skin.   Musculoskeletal: Normal digits and nails by inspection. No clubbing.   Neurologic Examination  Mental status/Cognition: Alert, oriented to self, place, month and year, good attention.  Speech/language: Fluent, comprehension intact, object naming intact, repetition intact.  Cranial nerves:   CN II Pupils equal and reactive to light, no VF deficits    CN III,IV,VI EOM intact, no gaze preference or deviation, no nystagmus    CN V normal sensation in V1, V2, and V3 segments bilaterally    CN VII no asymmetry, no nasolabial fold flattening   CN VIII normal hearing to speech    CN IX & X normal palatal elevation, no uvular deviation    CN XI 5/5 head turn and 5/5 shoulder shrug bilaterally    CN XII midline tongue protrusion    Motor:  Muscle bulk: normal, tone normal, pronator drift none tremor none Mvmt Root Nerve  Muscle Right Left Comments  SA C5/6 Ax Deltoid 5 5   EF C5/6 Mc Biceps 5 5   EE C6/7/8 Rad Triceps 5 5   WF C6/7 Med FCR     WE C7/8 PIN ECU     F Ab C8/T1 U ADM/FDI 5 5   HF L1/2/3 Fem Illopsoas 5 5   KE L2/3/4 Fem Quad 5 5   DF L4/5 D Peron Tib Ant 5 5   PF S1/2 Tibial Grc/Sol 5 5    Sensation:  Light touch Decreased to touch in left lower face, left upper extremity.   Pin prick    Temperature    Vibration   Proprioception    Coordination/Complex Motor:  - Finger to Nose intact BL - Heel to shin intact BL - Rapid alternating movement are normal - Gait: Deferred Labs   CBC:  Recent Labs  Lab  03/19/22 2120  WBC 10.5  NEUTROABS 8.8*  HGB 12.7*  HCT 38.6*  MCV 90.6  PLT 564    Basic Metabolic Panel:  Lab Results  Component Value Date   NA 136 03/19/2022   K 4.1 03/19/2022   CO2 21 (L) 03/19/2022   GLUCOSE 235 (H) 03/19/2022   BUN 17 03/19/2022   CREATININE 1.14 03/19/2022   CALCIUM 9.2 03/19/2022   GFRNONAA >60 03/19/2022   GFRAA 87 07/05/2019   Lipid Panel:  Lab Results  Component Value Date   LDLCALC 57 03/02/2022   HgbA1c:  Lab Results  Component Value Date   HGBA1C 6.8 (H) 02/19/2022   Urine Drug Screen: No results found for: "LABOPIA", "COCAINSCRNUR", "LABBENZ", "AMPHETMU", "THCU", "LABBARB"  Alcohol Level     Component Value Date/Time   ETH <10 03/02/2022 0726    CT Head without contrast(Personally reviewed): Acute hemorrhage involving the medial right temporal and occipital lobe, which correlates with the area of recent infarct seen on 03/02/2022, likely hemorrhagic transformation. This causes mild local mass effect on the right lateral ventricle, without evidence of entrapment or midline shift.  MRI Brain: Pending   Impression   Zachary Flynn is a 62 y.o. male with PMH significant for CAD, cardiogenic shock from  anterior STEMI (on Brilinta), CHF, HTN, HLD, recent cryptogenic R PCA stroke without hemorrhage and discharged om Aspirin '81mg'$  daily along with Brilinta 90 BID, left sided numbness residual from recent stroke who presents with 5 mins episode of dizziness. He got up from the kitchen table nd had sudden onset diziness but this also felt very similar to the stroke he had 2 weeks ago so he came to the ED.  CT Head without contrast demonstrates hemorrhagic transformation of the R PCA stroke withg mild localized mass effect. His neurologic examination is notable for mild L sided numbness.  Recommendations  Acute hemorrhagic transformation of the recent R PCA stroke: - Admit to ICU - Stability scan in 6 hours or STAT with any neurological  decline - Frequent neuro checks; q33mn for 1 hour, then q1hour - No antiplatelets or anticoagulants due to ICraig- SCD for DVT prophylaxis, pharmacological DVT ppx at 24 hours if ICH is stable - Blood pressure control with goal systolic 1803- 1212 cleverplex and labetalol PRN - Stroke labs, HgbA1c, fasting lipid panel - MRI brain with and without contrast when stabilized to evaluate for underlying mass - MRA without contrast of the brain and Vasc UKoreacarotid duplex to evaluate for underlying vascular abnormality. - Risk factor modification - Echocardiogram - PT consult, OT consult, Speech consult. - Stroke team to follow     ______________________________________________________________________   Thank you for the opportunity to take part in the care of this patient. If you have any further questions, please contact the neurology consultation attending.  Signed,  SProspect ParkPager Number [phone]_ _ _   _ __   _ __ _ _  __ __   _ __   __ _

## 2022-03-20 NOTE — Progress Notes (Addendum)
STROKE TEAM PROGRESS NOTE   INTERVAL HISTORY His wife is at the bedside.   Admitted 3 weeks and was discharged with instructions to wear a Zio patch due to concern for an embolic etiology of previous stroke.  Spoke with Dr. Ellyn Hack and he wants to follow up with him outpatient.  Patient presented with dizziness and vision difficulties and CT scan shows hemorrhagic conversion to the recent right PCA infarct.  Repeat CT scan shows stable appearance without significant midline shift or mass effect, intraventricular extension or hydrocephalus Vitals:   03/20/22 0530 03/20/22 0600 03/20/22 0630 03/20/22 0700  BP: 129/70 120/72 131/79 134/75  Pulse: 81 60 64 60  Resp: (!) '21 15 20 18  '$ Temp:      TempSrc:      SpO2: 97% 98% 96% 97%  Weight:      Height:       CBC:  Recent Labs  Lab 03/19/22 2120  WBC 10.5  NEUTROABS 8.8*  HGB 12.7*  HCT 38.6*  MCV 90.6  PLT 563   Basic Metabolic Panel:  Recent Labs  Lab 03/19/22 2120  NA 136  K 4.1  CL 105  CO2 21*  GLUCOSE 235*  BUN 17  CREATININE 1.14  CALCIUM 9.2   Lipid Panel: No results for input(s): "CHOL", "TRIG", "HDL", "CHOLHDL", "VLDL", "LDLCALC" in the last 168 hours. HgbA1c: No results for input(s): "HGBA1C" in the last 168 hours. Urine Drug Screen: No results for input(s): "LABOPIA", "COCAINSCRNUR", "LABBENZ", "AMPHETMU", "THCU", "LABBARB" in the last 168 hours.  Alcohol Level No results for input(s): "ETH" in the last 168 hours.  IMAGING past 24 hours CT HEAD WO CONTRAST (5MM)  Result Date: 03/19/2022 CLINICAL DATA:  Dizziness, recent stroke EXAM: CT HEAD WITHOUT CONTRAST TECHNIQUE: Contiguous axial images were obtained from the base of the skull through the vertex without intravenous contrast. RADIATION DOSE REDUCTION: This exam was performed according to the departmental dose-optimization program which includes automated exposure control, adjustment of the mA and/or kV according to patient size and/or use of iterative  reconstruction technique. COMPARISON:  03/02/2022 CT head FINDINGS: Brain: Hyperdensity involving the medial right temporal and occipital lobe (series 4, image 12, which correlates with the area of recent infarct seen on 03/02/2022. This causes mild local mass effect on the right lateral ventricle, without evidence of entrapment of the right temporal horn or occipital horn. No additional area of acute infarct. No other evidence of hemorrhage. No mass, additional mass effect, or midline shift. No hydrocephalus or extra-axial collection. Redemonstrated remote infarct in the anterior left frontal lobe. Vascular: No hyperdense vessel. Skull: No acute osseous abnormality. Sinuses/Orbits: Minimal mucosal thickening in the left maxillary sinus. The orbits are unremarkable. Other: The mastoids are well aerated. IMPRESSION: 1. Acute hemorrhage involving the medial right temporal and occipital lobe, which correlates with the area of recent infarct seen on 03/02/2022, likely hemorrhagic transformation. This causes mild local mass effect on the right lateral ventricle, without evidence of entrapment or midline shift. 2. No other acute intracranial process. These findings were discussed by telephone on 03/19/2022 at 11:34 pm with provider Dr. Lorrin Goodell. Electronically Signed   By: Merilyn Baba M.D.   On: 03/19/2022 23:53   DG Chest Port 1 View  Result Date: 03/19/2022 CLINICAL DATA:  Presyncope, dizziness. EXAM: PORTABLE CHEST 1 VIEW COMPARISON:  03/21/2021. FINDINGS: Heart is enlarged and the mediastinal contour is within normal limits. Interstitial prominence is noted at the lung bases bilaterally. No consolidation, effusion, or pneumothorax. Cervical spinal  fusion hardware is noted. No acute osseous abnormality. IMPRESSION: 1. Cardiomegaly. 2. Persistent interstitial thickening at the lung bases. Electronically Signed   By: Brett Fairy M.D.   On: 03/19/2022 21:23    PHYSICAL EXAM  Physical Exam  Constitutional:  Appears well-developed and well-nourished pleasant middle-aged African-American male Cardiovascular: Normal rate and regular rhythm.  Respiratory: Effort normal, non-labored breathing  Neuro: Mental Status: Patient is awake, alert, oriented to person, place, month, year, and situation. Patient is able to give a clear and coherent history. No signs of aphasia or neglect.  Diminished recall 1/3.  Able to name 11 animals which can walk on 4 legs. Cranial Nerves: II:  PERRL, left upper quadrant field quadrantanopsia  III,IV, VI: EOMI without ptosis or diploplia.  V: Facial sensation is symmetric  VII: Facial movement is symmetric resting and smiling VIII: Hearing is intact to voice X: Phonation normal  XI: Shoulder shrug is symmetric. XII: Tongue protrudes midline without atrophy or fasciculations.  Motor: Tone is normal. Bulk is normal. 5/5 strength was present in all four extremities.  Sensory: Sensation is symmetric to light touch in the arms and legs. Cerebellar: FNF intact bilaterally Sitting up in chair        ASSESSMENT/PLAN Mr. QUANDARIUS NILL is a 62 y.o. male with history of CAD, cardiogenic shock from anterior STEMI (on Brilinta), CHF, HTN, HLD, recent cryptogenic R PCA stroke without hemorrhage and discharged om Aspirin '81mg'$  daily along with Brilinta 90 BID, left sided numbness residual from recent stroke who presents with 5 mins episode of dizziness  presenting with dizziness.   Stroke:  ICH- PCA infarct with delayed hemorrhagic conversion likely due to dual antiplatelet therapy of aspirin and Brilinta Etiology:  delayed HT while on Brilinta and ASA  Code Stroke CT head No acute abnormality. ASPECTS 10.    CT Head- Unchanged large hemorrhagic right PCA infarct. No new intracranial abnormality. 2D Echo EF 50-55%, LA mildly dilated LDL 57 HgbA1c 6.8    Diet   Diet Carb Modified Fluid consistency: Thin; Room service appropriate? Yes   aspirin 81 mg daily and  Brilinta (ticagrelor) 90 mg bid prior to admission, consider resuming ASA '81mg'$  in 3 days.  Therapy recommendations:  No follow up needed Disposition:  No   Hypertension Home meds:  Coreg, Valsartan  Stable BP goal 160 Long-term BP goal normotensive  Hyperlipidemia Home meds:  Crestor, resumed in hospital LDL 57, goal < 70 Continue statin at discharge  Diabetes type II Controlled Home meds:  None  HgbA1c 6.8, goal < 7.0 CBGs Recent Labs    03/19/22 2124  GLUCAP 220*    SSI  Other Stroke Risk Factors Substance abuse - UDS:  THC No results found for requested labs within last 1095 days., Cocaine No results found for requested labs within last 1095 days.. Patient advised to stop using due to stroke risk. Hx of CVA - 12/30 Acute Right PCA territory infarct, with evidence of associated Right PCA occlusion  Coronary artery disease s/p stenting  MI in Sept 2020  Follow up with cardiology outpatient  Previously on DAPT with Brilinta and ASA    Hospital day # 1  Patient seen and examined by NP/APP with MD. MD to update note as needed.   Janine Ores, DNP, FNP-BC [facility] Pager: [phone]  STROKE MD NOTE :  I have personally obtained history,examined this patient, reviewed notes, independently viewed imaging studies, participated in medical decision making and plan of care.ROS completed by  me personally and pertinent positives fully documented  I have made any additions or clarifications directly to the above note. Agree with note above.  Patient was recently admitted with right PCA infarct of cryptogenic etiology and discharged home on aspirin and Brilinta and presented with worsening dizziness and vision difficulties and brain imaging shows hemorrhagic transformation into the recent right PCA infarct.  Recommend close neurological observation and strict blood pressure control with systolic goal 175-102 for the first 24 hours and below 160.  Hold aspirin and  Brilinta for now.  We will discuss with cardiology if patient can come off Brilinta since his cardiac stent was 3 years ago.  We will resume aspirin tomorrow and likely discharge home if stable.  Physical Occupational Therapy consults.  Long discussion patient and wife at the bedside and answered questions.  This patient is critically ill and at significant risk of neurological worsening, death and care requires constant monitoring of vital signs, hemodynamics,respiratory and cardiac monitoring, extensive review of multiple databases, frequent neurological assessment, discussion with family, other specialists and medical decision making of high complexity.I have made any additions or clarifications directly to the above note.This critical care time does not reflect procedure time, or teaching time or supervisory time of PA/NP/Med Resident etc but could involve care discussion time.  I spent 30 minutes of neurocritical care time  in the care of  this patient.      Antony Contras, MD Medical Director [facility] Pager: [phone] 03/20/2022 4:58 PM   To contact Stroke Continuity provider, please refer to [email or web address] After hours, contact General Neurology

## 2022-03-21 DIAGNOSIS — I611 Nontraumatic intracerebral hemorrhage in hemisphere, cortical: Secondary | ICD-10-CM | POA: Diagnosis not present

## 2022-03-21 LAB — GLUCOSE, CAPILLARY
Glucose-Capillary: 200 mg/dL — ABNORMAL HIGH (ref 70–99)
Glucose-Capillary: 131 mg/dL — ABNORMAL HIGH (ref 70–99)

## 2022-03-21 NOTE — Progress Notes (Signed)
AVS/ discharge instructions reviewed with pt and family member at side. All questions answered. Emotional support provided. SWOT RN to help patient with work release letter.

## 2022-03-21 NOTE — Discharge Instructions (Signed)
Follow up with PCP for [facility] recheck outpatient and pre diabetes management.  Follow up with Dr. Ellyn Hack or his APP with [facility] within the month Follow up with [facility] within the next 3 months

## 2022-03-21 NOTE — Plan of Care (Signed)
Problem: Education: Goal: Knowledge of disease or condition will improve Outcome: Adequate for Discharge Goal: Knowledge of secondary prevention will improve (MUST DOCUMENT ALL) Outcome: Adequate for Discharge Goal: Knowledge of patient specific risk factors will improve [place] N/A or DELETE if not current risk factor) Outcome: Adequate for Discharge   Problem: Ischemic Stroke/TIA Tissue Perfusion: Goal: Complications of ischemic stroke/TIA will be minimized Outcome: Adequate for Discharge   Problem: Coping: Goal: Will verbalize positive feelings about self Outcome: Adequate for Discharge Goal: Will identify appropriate support needs Outcome: Adequate for Discharge   Problem: Health Behavior/Discharge Planning: Goal: Ability to manage health-related needs will improve Outcome: Adequate for Discharge Goal: Goals will be collaboratively established with patient/family Outcome: Adequate for Discharge   Problem: Self-Care: Goal: Ability to participate in self-care as condition permits will improve Outcome: Adequate for Discharge Goal: Verbalization of feelings and concerns over difficulty with self-care will improve Outcome: Adequate for Discharge Goal: Ability to communicate needs accurately will improve Outcome: Adequate for Discharge   Problem: Nutrition: Goal: Risk of aspiration will decrease Outcome: Adequate for Discharge Goal: Dietary intake will improve Outcome: Adequate for Discharge   Problem: Education: Goal: Knowledge of disease or condition will improve Outcome: Adequate for Discharge Goal: Knowledge of secondary prevention will improve (MUST DOCUMENT ALL) Outcome: Adequate for Discharge Goal: Knowledge of patient specific risk factors will improve [place] N/A or DELETE if not current risk factor) Outcome: Adequate for Discharge   Problem: Intracerebral Hemorrhage Tissue Perfusion: Goal: Complications of Intracerebral Hemorrhage will be minimized Outcome:  Adequate for Discharge   Problem: Coping: Goal: Will verbalize positive feelings about self Outcome: Adequate for Discharge Goal: Will identify appropriate support needs Outcome: Adequate for Discharge   Problem: Health Behavior/Discharge Planning: Goal: Ability to manage health-related needs will improve Outcome: Adequate for Discharge Goal: Goals will be collaboratively established with patient/family Outcome: Adequate for Discharge   Problem: Self-Care: Goal: Ability to participate in self-care as condition permits will improve Outcome: Adequate for Discharge Goal: Verbalization of feelings and concerns over difficulty with self-care will improve Outcome: Adequate for Discharge Goal: Ability to communicate needs accurately will improve Outcome: Adequate for Discharge   Problem: Nutrition: Goal: Risk of aspiration will decrease Outcome: Adequate for Discharge Goal: Dietary intake will improve Outcome: Adequate for Discharge   Problem: Education: Goal: Ability to describe self-care measures that may prevent or decrease complications (Diabetes Survival Skills Education) will improve Outcome: Adequate for Discharge Goal: Individualized Educational Video(s) Outcome: Adequate for Discharge   Problem: Coping: Goal: Ability to adjust to condition or change in health will improve Outcome: Adequate for Discharge   Problem: Fluid Volume: Goal: Ability to maintain a balanced intake and output will improve Outcome: Adequate for Discharge   Problem: Health Behavior/Discharge Planning: Goal: Ability to identify and utilize available resources and services will improve Outcome: Adequate for Discharge Goal: Ability to manage health-related needs will improve Outcome: Adequate for Discharge   Problem: Metabolic: Goal: Ability to maintain appropriate glucose levels will improve Outcome: Adequate for Discharge   Problem: Nutritional: Goal: Maintenance of adequate nutrition will  improve Outcome: Adequate for Discharge Goal: Progress toward achieving an optimal weight will improve Outcome: Adequate for Discharge   Problem: Skin Integrity: Goal: Risk for impaired skin integrity will decrease Outcome: Adequate for Discharge   Problem: Tissue Perfusion: Goal: Adequacy of tissue perfusion will improve Outcome: Adequate for Discharge   Problem: Education: Goal: Knowledge of General Education information will improve Description: Including pain rating scale, medication(s)/side effects and non-pharmacologic comfort  measures Outcome: Adequate for Discharge   Problem: Health Behavior/Discharge Planning: Goal: Ability to manage health-related needs will improve Outcome: Adequate for Discharge   Problem: Clinical Measurements: Goal: Ability to maintain clinical measurements within normal limits will improve Outcome: Adequate for Discharge Goal: Will remain free from infection Outcome: Adequate for Discharge Goal: Diagnostic test results will improve Outcome: Adequate for Discharge Goal: Respiratory complications will improve Outcome: Adequate for Discharge Goal: Cardiovascular complication will be avoided Outcome: Adequate for Discharge   Problem: Activity: Goal: Risk for activity intolerance will decrease Outcome: Adequate for Discharge   Problem: Nutrition: Goal: Adequate nutrition will be maintained Outcome: Adequate for Discharge   Problem: Coping: Goal: Level of anxiety will decrease Outcome: Adequate for Discharge   Problem: Elimination: Goal: Will not experience complications related to bowel motility Outcome: Adequate for Discharge Goal: Will not experience complications related to urinary retention Outcome: Adequate for Discharge

## 2022-03-21 NOTE — Discharge Summary (Addendum)
Stroke Discharge Summary  Patient ID: Zachary Flynn   MRN: [id number]      DOB: November 06, 1960  Date of Admission: 03/19/2022 Date of Discharge: 03/21/2022  Attending Physician:  Antony Contras MD Consultant(s):    Cardiology Patient's PCP:  Lawerance Cruel, MD  DISCHARGE DIAGNOSIS:  Principal Problem:   PCA infarct with delayed hemorrhagic conversion likely due to dual antiplatelet therapy of aspirin and Brilinta Left superior quadrantanopsia Coronary artery disease s/p STEMI-PCI Hypertension Hyperlipidemia Chronic diastolic CHF  Allergies as of 03/21/2022       Reactions   Lactose Intolerance (gi) Diarrhea   Pollen Extract Itching, Other (See Comments)   Itchy, watery eyes Congestion    Zestril [lisinopril] Cough        Medication List     STOP taking these medications    Brilinta 60 MG Tabs tablet Generic drug: ticagrelor       TAKE these medications    acetaminophen 500 MG tablet Commonly known as: TYLENOL Take 1,000 mg by mouth daily as needed for headache.   aspirin EC 81 MG tablet Take 1 tablet (81 mg total) by mouth daily. Swallow whole. What changed:  when to take this additional instructions   carvedilol 6.25 MG tablet Commonly known as: COREG TAKE ONE TABLET BY MOUTH TWICE A DAY WITH MEALS What changed:  how much to take how to take this when to take this   nitroGLYCERIN 0.4 MG SL tablet Commonly known as: Nitrostat Place 1 tablet (0.4 mg total) under the tongue every 5 (five) minutes as needed for chest pain.   rosuvastatin 20 MG tablet Commonly known as: CRESTOR TAKE ONE TABLET BY MOUTH DAILY What changed:  how much to take when to take this   Saw Palmetto Caps Take 1 capsule by mouth every evening.   sildenafil 20 MG tablet Commonly known as: REVATIO Take 20-100 mg by mouth daily as needed for erectile dysfunction.   SYSTANE BALANCE OP Place 1 drop into both eyes daily as needed (dry eyes).   valsartan 320 MG  tablet Commonly known as: DIOVAN TAKE ONE TABLET BY MOUTH DAILY What changed: when to take this        LABORATORY STUDIES CBC    Component Value Date/Time   WBC 10.5 03/19/2022 2120   RBC 4.26 03/19/2022 2120   HGB 12.7 (L) 03/19/2022 2120   HGB 13.5 02/11/2019 0821   HCT 38.6 (L) 03/19/2022 2120   HCT 42.0 02/11/2019 0821   PLT 177 03/19/2022 2120   PLT 175 02/11/2019 0821   MCV 90.6 03/19/2022 2120   MCV 87 02/11/2019 0821   MCH 29.8 03/19/2022 2120   MCHC 32.9 03/19/2022 2120   RDW 11.9 03/19/2022 2120   RDW 11.8 02/11/2019 0821   LYMPHSABS 0.9 03/19/2022 2120   MONOABS 0.6 03/19/2022 2120   EOSABS 0.2 03/19/2022 2120   BASOSABS 0.0 03/19/2022 2120   CMP    Component Value Date/Time   NA 136 03/19/2022 2120   NA 142 02/28/2022 0817   K 4.1 03/19/2022 2120   CL 105 03/19/2022 2120   CO2 21 (L) 03/19/2022 2120   GLUCOSE 235 (H) 03/19/2022 2120   BUN 17 03/19/2022 2120   BUN 15 02/28/2022 0817   CREATININE 1.14 03/19/2022 2120   CALCIUM 9.2 03/19/2022 2120   PROT 5.7 (L) 03/03/2022 0227   PROT 6.4 02/19/2022 0836   ALBUMIN 3.6 03/03/2022 0227   ALBUMIN 4.4 02/19/2022 0836   AST  20 03/03/2022 0227   ALT 20 03/03/2022 0227   ALKPHOS 58 03/03/2022 0227   BILITOT 1.5 (H) 03/03/2022 0227   BILITOT 1.2 02/19/2022 0836   GFRNONAA >60 03/19/2022 2120   GFRAA 87 07/05/2019 0938   COAGS Lab Results  Component Value Date   INR 1.1 03/02/2022   INR 1.0 12/02/2018   Lipid Panel    Component Value Date/Time   CHOL 101 03/02/2022 1130   CHOL 105 02/19/2022 0836   TRIG 48 03/02/2022 1130   HDL 34 (L) 03/02/2022 1130   HDL 35 (L) 02/19/2022 0836   CHOLHDL 3.0 03/02/2022 1130   VLDL 10 03/02/2022 1130   LDLCALC 57 03/02/2022 1130   LDLCALC 58 02/19/2022 0836   HgbA1C  Lab Results  Component Value Date   HGBA1C 6.8 (H) 02/19/2022   Urinalysis No results found for: "COLORURINE", "APPEARANCEUR", "LABSPEC", "PHURINE", "GLUCOSEU", "HGBUR", "BILIRUBINUR",  "KETONESUR", "PROTEINUR", "UROBILINOGEN", "NITRITE", "LEUKOCYTESUR" Urine Drug Screen No results found for: "LABOPIA", "COCAINSCRNUR", "LABBENZ", "AMPHETMU", "THCU", "LABBARB"  Alcohol Level    Component Value Date/Time   ETH <10 03/02/2022 0726     SIGNIFICANT DIAGNOSTIC STUDIES CT HEAD WO CONTRAST (5MM)  Result Date: 03/20/2022 CLINICAL DATA:  Stroke, hemorrhagic. EXAM: CT HEAD WITHOUT CONTRAST TECHNIQUE: Contiguous axial images were obtained from the base of the skull through the vertex without intravenous contrast. RADIATION DOSE REDUCTION: This exam was performed according to the departmental dose-optimization program which includes automated exposure control, adjustment of the mA and/or kV according to patient size and/or use of iterative reconstruction technique. COMPARISON:  Head CT 03/19/2022 FINDINGS: Brain: The known large hemorrhagic right PCA infarct and associated edema are unchanged. A subacute right thalamic infarct and chronic anterior left MCA infarct are again noted. No midline shift, new infarct, new intracranial hemorrhage, hydrocephalus, or extra-axial fluid collection is identified. There is mild cerebral atrophy. Vascular: Calcified atherosclerosis at the skull base. Skull: No acute fracture or suspicious osseous lesion. Sinuses/Orbits: Visualized paranasal sinuses and mastoid air cells are clear. Unremarkable orbits. Other: None. IMPRESSION: 1. Unchanged large hemorrhagic right PCA infarct. 2. No new intracranial abnormality. Electronically Signed   By: Logan Bores M.D.   On: 03/20/2022 08:26   CT HEAD WO CONTRAST (5MM)  Result Date: 03/19/2022 CLINICAL DATA:  Dizziness, recent stroke EXAM: CT HEAD WITHOUT CONTRAST TECHNIQUE: Contiguous axial images were obtained from the base of the skull through the vertex without intravenous contrast. RADIATION DOSE REDUCTION: This exam was performed according to the departmental dose-optimization program which includes automated exposure  control, adjustment of the mA and/or kV according to patient size and/or use of iterative reconstruction technique. COMPARISON:  03/02/2022 CT head FINDINGS: Brain: Hyperdensity involving the medial right temporal and occipital lobe (series 4, image 12, which correlates with the area of recent infarct seen on 03/02/2022. This causes mild local mass effect on the right lateral ventricle, without evidence of entrapment of the right temporal horn or occipital horn. No additional area of acute infarct. No other evidence of hemorrhage. No mass, additional mass effect, or midline shift. No hydrocephalus or extra-axial collection. Redemonstrated remote infarct in the anterior left frontal lobe. Vascular: No hyperdense vessel. Skull: No acute osseous abnormality. Sinuses/Orbits: Minimal mucosal thickening in the left maxillary sinus. The orbits are unremarkable. Other: The mastoids are well aerated. IMPRESSION: 1. Acute hemorrhage involving the medial right temporal and occipital lobe, which correlates with the area of recent infarct seen on 03/02/2022, likely hemorrhagic transformation. This causes mild local mass effect on the  right lateral ventricle, without evidence of entrapment or midline shift. 2. No other acute intracranial process. These findings were discussed by telephone on 03/19/2022 at 11:34 pm with provider Dr. Lorrin Goodell. Electronically Signed   By: Merilyn Baba M.D.   On: 03/19/2022 23:53   DG Chest Port 1 View  Result Date: 03/19/2022 CLINICAL DATA:  Presyncope, dizziness. EXAM: PORTABLE CHEST 1 VIEW COMPARISON:  03/21/2021. FINDINGS: Heart is enlarged and the mediastinal contour is within normal limits. Interstitial prominence is noted at the lung bases bilaterally. No consolidation, effusion, or pneumothorax. Cervical spinal fusion hardware is noted. No acute osseous abnormality. IMPRESSION: 1. Cardiomegaly. 2. Persistent interstitial thickening at the lung bases. Electronically Signed   By: Brett Fairy M.D.   On: 03/19/2022 21:23   MR BRAIN WO CONTRAST  Result Date: 03/02/2022 CLINICAL DATA:  Right PCA territory infarct.  Stroke, follow-up. EXAM: MRI HEAD WITHOUT CONTRAST TECHNIQUE: Multiplanar, multiecho pulse sequences of the brain and surrounding structures were obtained without intravenous contrast. COMPARISON:  CT head and CTA head and neck 03/02/2022. FINDINGS: Brain: The diffusion-weighted images confirm acute nonhemorrhagic infarct involving the medial right temporal and occipital lobe. 11 mm acute nonhemorrhagic infarct is present within the right thalamus. Both areas demonstrate increased T2 and FLAIR signal hyperintensity. A remote infarct involving the anterior left frontal lobe is noted. No significant white matter disease is present apart from the infarcts. The ventricles are of normal size. Deep brain nuclei are within normal limits. No significant extraaxial fluid collection is present. The internal auditory canals are within normal limits. The brainstem and cerebellum are within normal limits. Vascular: Flow is present in the major intracranial arteries. Skull and upper cervical spine: The craniocervical junction is normal. Upper cervical spine is within normal limits. Marrow signal is unremarkable. Sinuses/Orbits: The paranasal sinuses and mastoid air cells are clear. The globes and orbits are within normal limits. IMPRESSION: 1. Acute nonhemorrhagic infarct involving the medial right temporal and occipital lobe. 2. 11 mm acute nonhemorrhagic infarct involving the right thalamus. 3. Remote infarct of the anterior left frontal lobe. These results were called by telephone at the time of interpretation on 03/02/2022 at 2:50 pm to provider Dr. Rory Percy, who verbally acknowledged these results. Electronically Signed   By: San Morelle M.D.   On: 03/02/2022 14:52   ECHOCARDIOGRAM COMPLETE  Result Date: 03/02/2022    ECHOCARDIOGRAM REPORT   Patient Name:   PHARRELL LEDFORD Date of  Exam: 03/02/2022 Medical Rec #:  [id number]       Height:       68.0 in Accession #:    [id number]      Weight:       181.8 lb Date of Birth:  12-27-1960       BSA:          1.963 m Patient Age:    53 years        BP:           137/85 mmHg Patient Gender: M               HR:           70 bpm. Exam Location:  Inpatient Procedure: 2D Echo Indications:    stroke  History:        Patient has prior history of Echocardiogram examinations, most                 recent 11/24/2019. CAD; Risk Factors:Hypertension and  Dyslipidemia.  Sonographer:    Johny Chess RDCS Referring Phys: [phone] RONDELL A SMITH IMPRESSIONS  1. Left ventricular ejection fraction, by estimation, is 50 to 55%. The left ventricle has low normal function. The left ventricle demonstrates regional wall motion abnormalities (see scoring diagram/findings for description). There is mild concentric left ventricular hypertrophy. Left ventricular diastolic parameters are consistent with Grade II diastolic dysfunction (pseudonormalization). Elevated left atrial pressure.  2. Right ventricular systolic function is normal. The right ventricular size is normal.  3. Left atrial size was mildly dilated.  4. The mitral valve is normal in structure. No evidence of mitral valve regurgitation.  5. The aortic valve is tricuspid. Aortic valve regurgitation is trivial.  6. There is borderline dilatation of the ascending aorta, measuring 39 mm. Comparison(s): No significant change from prior study. Prior images reviewed side by side. FINDINGS  Left Ventricle: Left ventricular ejection fraction, by estimation, is 50 to 55%. The left ventricle has low normal function. The left ventricle demonstrates regional wall motion abnormalities. The left ventricular internal cavity size was normal in size. There is mild concentric left ventricular hypertrophy. Left ventricular diastolic parameters are consistent with Grade II diastolic dysfunction (pseudonormalization).  Elevated left atrial pressure.  LV Wall Scoring: The apical septal segment, apical anterior segment, apical inferior segment, and apex are hypokinetic. Right Ventricle: The right ventricular size is normal. No increase in right ventricular wall thickness. Right ventricular systolic function is normal. Left Atrium: Left atrial size was mildly dilated. Right Atrium: Right atrial size was normal in size. Pericardium: There is no evidence of pericardial effusion. Mitral Valve: The mitral valve is normal in structure. No evidence of mitral valve regurgitation. Tricuspid Valve: The tricuspid valve is normal in structure. Tricuspid valve regurgitation is not demonstrated. Aortic Valve: The aortic valve is tricuspid. Aortic valve regurgitation is trivial. Pulmonic Valve: The pulmonic valve was normal in structure. Pulmonic valve regurgitation is not visualized. Aorta: The aortic root is normal in size and structure. There is borderline dilatation of the ascending aorta, measuring 39 mm. IAS/Shunts: No atrial level shunt detected by color flow Doppler.  LEFT VENTRICLE PLAX 2D LVIDd:         4.40 cm   Diastology LVIDs:         3.30 cm   LV e' medial:    6.64 cm/s LV PW:         1.30 cm   LV E/e' medial:  14.1 LV IVS:        1.10 cm   LV e' lateral:   7.72 cm/s LVOT diam:     2.00 cm   LV E/e' lateral: 12.1 LV SV:         71 LV SV Index:   36 LVOT Area:     3.14 cm  RIGHT VENTRICLE             IVC RV S prime:     13.20 cm/s  IVC diam: 1.70 cm TAPSE (M-mode): 2.2 cm LEFT ATRIUM             Index        RIGHT ATRIUM           Index LA diam:        3.60 cm 1.83 cm/m   RA Area:     13.60 cm LA Vol (A2C):   75.9 ml 38.67 ml/m  RA Volume:   30.80 ml  15.69 ml/m LA Vol (A4C):   56.7 ml 28.89 ml/m LA  Biplane Vol: 68.4 ml 34.85 ml/m  AORTIC VALVE LVOT Vmax:   112.00 cm/s LVOT Vmean:  76.200 cm/s LVOT VTI:    0.226 m  AORTA Ao Root diam: 3.60 cm Ao Asc diam:  3.90 cm MITRAL VALVE MV Area (PHT): 3.42 cm    SHUNTS MV Decel Time:  222 msec    Systemic VTI:  0.23 m MV E velocity: 93.60 cm/s  Systemic Diam: 2.00 cm MV A velocity: 70.80 cm/s MV E/A ratio:  1.32 Mihai Croitoru MD Electronically signed by Sanda Klein MD Signature Date/Time: 03/02/2022/2:44:18 PM    Final    CT ANGIO HEAD NECK W WO CM W PERF (CODE STROKE)  Result Date: 03/02/2022 CLINICAL DATA:  62 year old male with neurologic deficit beginning 1930 hours yesterday and evidence of evolving right PCA territory infarct on plain CT. EXAM: CT ANGIOGRAPHY HEAD AND NECK CT PERFUSION BRAIN TECHNIQUE: Multidetector CT imaging of the head and neck was performed using the standard protocol during bolus administration of intravenous contrast. Multiplanar CT image reconstructions and MIPs were obtained to evaluate the vascular anatomy. Carotid stenosis measurements (when applicable) are obtained utilizing NASCET criteria, using the distal internal carotid diameter as the denominator. Multiphase CT imaging of the brain was performed following IV bolus contrast injection. Subsequent parametric perfusion maps were calculated using RAPID software. RADIATION DOSE REDUCTION: This exam was performed according to the departmental dose-optimization program which includes automated exposure control, adjustment of the mA and/or kV according to patient size and/or use of iterative reconstruction technique. CONTRAST:  100 mL Omnipaque 350 COMPARISON:  Plain head CTs today 0821 and 0906 hours. CTA chest 02/05/2021. FINDINGS: CT Brain Perfusion Findings: ASPECTS: Not applicable, right PCA cytotoxic edema. CBF (<30%) Volume: 23m. No abnormal CBF or CBV parameter. Perfusion (Tmax>6.0s) volume: 16 mL, corresponding to the right PCA territory cytotoxic edema. Mismatch Volume: Not applicable Infarction Location:Right PCA CTA NECK Skeleton: Status post C4-C5 cervical disc arthroplasty and superimposed C5-C6 and C6-C7 ACDF. Evidence of chronic pseudoarthrosis at the latter. No acute osseous abnormality  identified. Upper chest: Mild dependent atelectasis. Evidence of chronic increased right paratracheal and mediastinal lymph node tissue which seems progressed from 1 year ago (series 5, image 174) but has a nonspecific appearance. Other neck: No acute neck soft tissue finding identified. Aortic arch: 3 vessel arch configuration without atherosclerosis. Right carotid system: Negative brachiocephalic artery and right CCA origin. Mild soft plaque proximal to the bifurcation. Relatively mild soft and calcified plaque at the right ICA origin and bulb with no stenosis. Left carotid system: Minimal soft plaque in the left CCA before the bifurcation. Minimal soft and calcified plaque at the proximal left ICA without stenosis. Vertebral arteries: Proximal right subclavian artery and right vertebral artery origin are patent with mild calcified plaque but no significant stenosis. Slightly non dominant right vertebral artery. Obscured at the level of the C4-C5 disc arthroplasty, but otherwise patent to the skull base without stenosis. Minimal soft plaque in the proximal left subclavian artery without stenosis. Normal left vertebral artery origin. Mildly dominant left vertebral artery is patent to the skull base, obscured at the C4-C5 disc arthroplasty level. No stenosis. CTA HEAD Posterior circulation: Dominant left V4 segment. No distal vertebral or vertebrobasilar junction plaque or stenosis. Patent basilar artery without stenosis. Patent basilar tip, SCA and PCA origins. Mild left PCA irregularity most pronounced in the P1 segment. No significant stenosis. Left PCA branches within normal limits. Right PCA P1 and proximal P2 segment are normal but there is occlusion at  the P2/P3 junction on series 13, image 18. Some distal reconstitution. Posterior communicating arteries are diminutive or absent. Anterior circulation: Both ICA siphons are patent. Mild left siphon tortuosity and calcified plaque without stenosis. Mild right  siphon tortuosity and calcified plaque without stenosis. There is a small right posterior communicating artery with a small infundibulum on series 10, image 92 (normal variant). Patent carotid termini, MCA and ACA origins. Anterior communicating artery and bilateral ACA branches are within normal limits. Left MCA M1 segment and left MCA bifurcation are patent. Left MCA branches are within normal limits. Right MCA M1 segment and bifurcation are patent without stenosis. Right MCA branches are within normal limits. Venous sinuses: Early contrast timing, superior sagittal sinus is patent. Anatomic variants: Left vertebral artery is mildly dominant throughout. Review of the MIP images confirms the above findings IMPRESSION: 1. Positive for Right PCA occlusion at the P2/P3 junction. Associated Right PCA territory infarct visible by plain CT is only detected via T-max on CTP (CBF and CBV probably pseudo-normalized). 2. No other large vessel occlusion, and mild for age atherosclerosis in the head and neck with no other significant arterial stenosis. 3. Nonspecific lymphadenopathy in the visible superior mediastinum, chronic but progressed from last year. Dr. Rory Percy advises this patient might carried diagnosis of Sarcoidosis. Consider referral to [facility]). 4. Status post C4-C5 cervical disc arthroplasty and C5-C6 and C6-C7 ACDF, evidence of chronic pseudoarthrosis at the latter. Salient findings were discussed by telephone with Dr. Rory Percy on 03/02/2022 at 09:31 . Electronically Signed   By: Genevie Ann M.D.   On: 03/02/2022 09:34   CT HEAD CODE STROKE WO CONTRAST`  Result Date: 03/02/2022 CLINICAL DATA:  Code stroke. 62 year old male with neurologic deficit and acute right PCA territory infarct on head CT this morning. EXAM: CT HEAD WITHOUT CONTRAST TECHNIQUE: Contiguous axial images were obtained from the base of the skull through the vertex without intravenous contrast. RADIATION  DOSE REDUCTION: This exam was performed according to the departmental dose-optimization program which includes automated exposure control, adjustment of the mA and/or kV according to patient size and/or use of iterative reconstruction technique. COMPARISON:  0821 hours today. FINDINGS: Brain: Stable non contrast CT appearance of the brain. No acute intracranial hemorrhage identified. No midline shift, mass effect, or evidence of intracranial mass lesion. Vascular: Subtle right PCA hyperdensity as detailed earlier. No new suspicious vascular hyperdensity. Skull: Stable. Sinuses/Orbits: Stable. Other: Stable. ASPECTS [facility] Stroke Program Early CT Score) - Ganglionic level infarction (caudate, lentiform nuclei, internal capsule, insula, M1-M3 cortex): - Supraganglionic infarction (M4-M6 cortex): Total score (0-10 with 10 being normal): IMPRESSION: Stable from 45 minutes earlier. Acute appearing Right PCA territory infarct with no hemorrhagic transformation or mass effect. CTA is pending. These results were communicated to Dr. Rory Percy at 9:12 am on 03/02/2022 by text page via the [facility] messaging system. Electronically Signed   By: Genevie Ann M.D.   On: 03/02/2022 09:12   CT HEAD WO CONTRAST  Addendum Date: 03/02/2022   ADDENDUM REPORT: 03/02/2022 08:47 ADDENDUM: Study discussed by telephone with Dr. Gareth Morgan on 03/02/2022 at 0835 hours. Electronically Signed   By: Genevie Ann M.D.   On: 03/02/2022 08:47   Result Date: 03/02/2022 CLINICAL DATA:  62 year old male with left side numbness and weakness since 1930 hours yesterday. EXAM: CT HEAD WITHOUT CONTRAST TECHNIQUE: Contiguous axial images were obtained from the base of the skull through the vertex without intravenous contrast. RADIATION DOSE REDUCTION: This exam was performed according to  the departmental dose-optimization program which includes automated exposure control, adjustment of the mA and/or kV according to patient size and/or use of iterative  reconstruction technique. COMPARISON:  None Available. FINDINGS: Brain: Chronic encephalomalacia left anterior insula and inferior frontal gyrus. Somewhat generalized cerebral volume loss over that expected for age. No midline shift, mass effect, or evidence of intracranial mass lesion. No ventriculomegaly. No acute intracranial hemorrhage identified. Right PCA territory asymmetric gray and white matter hypodensity compatible with cytotoxic edema (series 3, image 15 and coronal image 45). No associated hemorrhage or mass effect. No other acute cortically based infarct identified. Deep gray nuclei, brainstem, and cerebellum appear negative. Vascular: Calcified atherosclerosis at the skull base. Subtle right PCA hyperdensity visible in the right ambient cistern also series 3, image 13. Skull: No acute osseous abnormality identified. Sinuses/Orbits: Visualized paranasal sinuses and mastoids are clear. Other: Visualized orbits and scalp soft tissues are within normal limits. IMPRESSION: 1. Acute Right PCA territory infarct, with evidence of associated Right PCA occlusion. No associated hemorrhage or mass effect. 2. Chronic appearing anterior division Left MCA territory infarct with encephalomalacia. 3. No other acute intracranial abnormality identified. Electronically Signed: By: Genevie Ann M.D. On: 03/02/2022 08:29      HISTORY OF PRESENT ILLNESS Mr. AAIDYN SAN is a 62 y.o. male with history of CAD, cardiogenic shock from anterior STEMI (on Brilinta), CHF, HTN, HLD, recent cryptogenic R PCA stroke without hemorrhage and discharged om Aspirin '81mg'$  daily along with Brilinta 90 BID, left sided numbness residual from recent stroke who presents with 5 mins episode of dizziness  presenting with dizziness. Imaging shows ICH- PCA infarct with delayed hemorrhagic conversion likely due to dual antiplatelet therapy of aspirin and Brilinta. We will resume ASA '81mg'$  per cardiology. He will follow outpatient with cardiology and  neurology outpatient.      HOSPITAL COURSE Code Stroke CT head No acute abnormality. ASPECTS 10.    CT Head- Unchanged large hemorrhagic right PCA infarct. No new intracranial abnormality. 2D Echo EF 50-55%, LA mildly dilated LDL 57 HgbA1c 6.8       Diet    Diet Carb Modified Fluid consistency: Thin; Room service appropriate? Yes        aspirin 81 mg daily and Brilinta (ticagrelor) 90 mg bid prior to admission, resumed ASA '81mg'$  for stroke prevention  Therapy recommendations:  No follow up needed Disposition:  No    Hypertension Home meds:  Coreg, Valsartan  Stable BP goal 160 Long-term BP goal normotensive   Hyperlipidemia Home meds:  Crestor, resumed in hospital LDL 57, goal < 70 Continue statin at discharge   Diabetes type II Controlled Home meds:  None  HgbA1c 6.8, goal < 7.0 CBGs Recent Labs (last 2 labs)     Recent Labs    03/19/22 2124  GLUCAP 220*      SSI   Other Stroke Risk Factors Substance abuse - UDS:  THC No results found for requested labs within last 1095 days., Cocaine No results found for requested labs within last 1095 days.. Patient advised to stop using due to stroke risk. Hx of CVA - 12/30 Acute Right PCA territory infarct, with evidence of associated Right PCA occlusion  Coronary artery disease s/p stenting  MI in Sept 2020  Follow up with cardiology outpatient  Previously on DAPT with Brilinta and AS   DISCHARGE EXAM Blood pressure (!) 136/90, pulse 65, temperature 98.8 F (37.1 C), temperature source Oral, resp. rate 18, height '5\' 8"'$  (1.727 m), weight  79 kg, SpO2 100 %.  Constitutional: Appears well-developed and well-nourished pleasant middle-aged African-American male Cardiovascular: Normal rate and regular rhythm.  Respiratory: Effort normal, non-labored breathing   Neuro: Mental Status: Patient is awake, alert, oriented to person, place, month, year, and situation. Patient is able to give a clear and coherent history. No  signs of aphasia or neglect.  Diminished recall 1/3.  Able to name 11 animals which can walk on 4 legs. Cranial Nerves: II:  PERRL, left upper quadrant field quadrantanopsia  III,IV, VI: EOMI without ptosis or diploplia.  V: Facial sensation is symmetric  VII: Facial movement is symmetric resting and smiling VIII: Hearing is intact to voice X: Phonation normal  XI: Shoulder shrug is symmetric. XII: Tongue protrudes midline without atrophy or fasciculations.  Motor: Tone is normal. Bulk is normal. 5/5 strength was present in all four extremities.  Sensory: Sensation is symmetric to light touch in the arms and legs. Cerebellar: FNF intact bilaterally Sitting up in chair      DISCHARGE PLAN Disposition:  Home aspirin 81 mg daily for secondary stroke prevention  Ongoing stroke risk factor control by Primary Care Physician at time of discharge Follow-up PCP Lawerance Cruel, MD in 2 weeks. Follow-up in [facility] in 8 weeks with stroke nurse practitioner, office to schedule an appointment.  Follow up with Dr. Ellyn Hack in 4 weeks Patient advised not to return to work for at least 1 week.  32  minutes were spent preparing discharge.  Patient seen and examined by NP/APP with MD. MD to update note as needed.   Janine Ores, DNP, FNP-BC [facility] Pager: [phone]  I have personally obtained history,examined this patient, reviewed notes, independently viewed imaging studies, participated in medical decision making and plan of care.ROS completed by me personally and pertinent positives fully documented  I have made any additions or clarifications directly to the above note. Agree with note above.    Antony Contras, MD Medical Director [facility] Pager: [phone] 03/21/2022 1:21 PM

## 2022-04-03 ENCOUNTER — Other Ambulatory Visit: Payer: Self-pay | Admitting: Cardiology

## 2022-04-08 NOTE — Progress Notes (Unsigned)
Cardiology Clinic Note   Patient Name: Zachary Flynn Date of Encounter: 04/10/2022  Primary Care Provider:  Lawerance Cruel, MD Primary Cardiologist:  [name], MD  Patient Profile    Zachary Flynn 62 year old male presents to the clinic today for follow-up evaluation of his coronary artery disease and essential hypertension.  Past Medical History    Past Medical History:  Diagnosis Date   CAD (coronary artery disease) 12/02/2018   a. 100% proximal LAD insetting of anterior STEMI--PCI: Resolute DES 3.0 mm x 22 mm (postdilated 3.5-3.1 mm).  Also 50% proximal RI.  Left dominant system.  Normal EF by LV gram.   Cardiogenic shock ([place]) 12/2018   Mild shock complicating anterior STEMI; did not require mechanical support   Cervical disc disease    Chronic diastolic CHF (congestive heart failure), NYHA class 1 ([place]) 11/2018   a. LVEDP elevated at time of MI ->; GRII DD on follow-up echo   Erectile dysfunction    Essential hypertension 12/02/2018   Hyperlipidemia with target LDL less than 70 12/02/2018   Mild dilation of ascending aorta ([place])    a. by echo 12/2018.   Pre-diabetes    Past Surgical History:  Procedure Laterality Date   CERVICAL SPINE SURGERY  2012   Dr. Drema Pry ACUTE MI REVASCULARIZATION N/A 12/02/2018   Procedure: Coronary/Graft Acute MI Revascularization-CORONARY STENT INTERVENTION;  Surgeon: [name], MD;  Location: [place] [facility];  Service: Cardiovascular;; p-mLAD 100% w/ 50% D1 ostial dz ( PCI - Resolute Onyx DES 3.0 mm x 32 mm --postdilated 3.5-3.1 mm).   LEFT HEART CATH AND CORONARY ANGIOGRAPHY N/A 12/02/2018   Procedure: LEFT HEART CATH AND CORONARY ANGIOGRAPHY;  Surgeon: [name], MD;  Location: [place] [facility]; INDICATION-ANTERIOR STEMI: p-mLAD 100% w/ 50% D1 ostial dz (DES PCI).  Moderate RI 55 %.  EF 55 to 60% (preliminary LV gram showed anterior hypokinesis, resolved following PCI).  Severely elevated LVEDP of  29 mmHg (acute diasto   REVISION TOTAL HIP ARTHROPLASTY Left 2003   TOTAL HIP ARTHROPLASTY Left 2000   Dr. Durward Fortes   TRANSTHORACIC ECHOCARDIOGRAM  12/02/2018   (In setting of anterior STEMI) EF 50-55%.  Apical septal and apical anterior akinesis.  Moderate LVH.  GR 1 DD.  Normal LA and RA size.  No valvular disease.  Mildly dilated ascending aorta of 39 mm.   TRANSTHORACIC ECHOCARDIOGRAM  11/24/2019   EF 45 to 50%.  Mildly decreased function.  Hypokinesis of the mid-apical anteroseptal and apical wall.  GRII DD.  (Pseudonormalization).  Normal RV size and function.  Mildly dilated ascending aorta 43 mm.  Normal CVP.    Allergies  Allergies  Allergen Reactions   Lactose Intolerance (Gi) Diarrhea   Pollen Extract Itching and Other (See Comments)    Itchy, watery eyes Congestion    Zestril [Lisinopril] Cough    History of Present Illness    Zachary Flynn is a PMH of coronary artery disease status post anterior STEMI in 9/20, HTN, HLD, prediabetes, mild thoracic aortic dilation and CVA.  He presented to the Cath Lab 12/02/2018 and underwent cardiac catheterization with PCI and DES to his LAD.  He was noted jailed 50% diagonal, EF 55-65%, severely dilated LVEDP.  He was seen in follow-up by Dr. Ellyn Hack on 12/05/2021.  During that time he denied chest pain.  He was continued on Brilinta 60 mg twice daily.  He was no longer on aspirin.  LDL was noted to be 51 on 11/22.  He was admitted to the hospital on 03/20/2022 and discharged on 03/21/2022.  He had cryptogenic R PCA CVA (12/30) without hemorrhage and was discharged on aspirin 81 mg.  He was noted to have left-sided numbness.  He was instructed to follow-up with neurology as an outpatient and cardiology as well.  He presents to the clinic today for follow-up evaluation and states he feels well today.  We reviewed his recent trip to the emergency department and his CVA.  He continues to wear his 30-day cardiac event monitor and feels that he  will be done with his monitor this weekend.  He returned last week and is tolerating his return to work well.  His blood pressure at home is well-controlled at 127/76.  On recheck today his blood pressure is 148/72.  I have asked him to maintain a blood pressure log and will give him a salty 6 diet she will plan follow-up in 3 to 4 months.  Today he denies chest pain, shortness of breath, lower extremity edema, fatigue, palpitations, melena, hematuria, hemoptysis, diaphoresis, weakness, presyncope, syncope, orthopnea, and PND.    Home Medications    Prior to Admission medications   Medication Sig Start Date End Date Taking? Authorizing Provider  acetaminophen (TYLENOL) 500 MG tablet Take 1,000 mg by mouth daily as needed for headache.    [provider]  aspirin EC 81 MG tablet Take 1 tablet (81 mg total) by mouth daily. Swallow whole. Patient taking differently: Take 81 mg by mouth in the morning. 03/04/22   Little Ishikawa, MD  carvedilol (COREG) 6.25 MG tablet TAKE 1 TABLET BY MOUTH TWICE A DAY 04/03/22   [name], MD  Misc Natural Products (SAW PALMETTO) CAPS Take 1 capsule by mouth every evening.    [provider]  nitroGLYCERIN (NITROSTAT) 0.4 MG SL tablet Place 1 tablet (0.4 mg total) under the tongue every 5 (five) minutes as needed for chest pain. 06/29/20 05/11/22  [name], MD  Propylene Glycol (SYSTANE BALANCE OP) Place 1 drop into both eyes daily as needed (dry eyes).    [provider]  rosuvastatin (CRESTOR) 20 MG tablet TAKE ONE TABLET BY MOUTH DAILY Patient taking differently: Take 40 mg by mouth in the morning. 01/08/22   [name], MD  sildenafil (REVATIO) 20 MG tablet Take 20-100 mg by mouth daily as needed for erectile dysfunction. 08/27/18   [provider]  valsartan (DIOVAN) 320 MG tablet TAKE ONE TABLET BY MOUTH DAILY Patient taking differently: Take 320 mg by mouth in the morning. 01/08/22   [name], MD     Family History    Family History  Problem Relation Age of Onset   CAD Mother    Heart attack Mother    CAD Sister    Heart attack Sister    Colon cancer Neg Hx    Stomach cancer Neg Hx    Pancreatic cancer Neg Hx    Esophageal cancer Neg Hx    He indicated that the status of his mother is unknown. He indicated that the status of his sister is unknown. He indicated that the status of his neg hx is unknown.  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[employer]: [employer]  Tobacco Use   Smoking status: Never   Smokeless tobacco: Never  Vaping Use   Vaping Use: Never used  Substance and Sexual Activity   Alcohol use: Never   Drug use: Never   Sexual activity: Not on file  Other Topics Concern   Not on file  Social History Narrative   Not on file   Social Determinants of Health   Financial Resource Strain: Not on file  Food Insecurity: No Food Insecurity (03/20/2022)   Hunger Vital Sign    Worried About Running Out of Food in the Last Year: Never true    Ran Out of Food in the Last Year: Never true  Transportation Needs: No Transportation Needs (03/20/2022)   PRAPARE - Hydrologist (Medical): No    Lack of Transportation (Non-Medical): No  Physical Activity: Not on file  Stress: Not on file  Social Connections: Not on file  Intimate Partner Violence: Not At Risk (03/20/2022)   Humiliation, Afraid, Rape, and Kick questionnaire    Fear of Current or Ex-Partner: No    Emotionally Abused: No    Physically Abused: No    Sexually Abused: No     Review of Systems    General:  No chills, fever, night sweats or weight changes.  Cardiovascular:  No chest pain, dyspnea on  exertion, edema, orthopnea, palpitations, paroxysmal nocturnal dyspnea. Dermatological: No rash, lesions/masses Respiratory: No cough, dyspnea Urologic: No hematuria, dysuria Abdominal:   No nausea, vomiting, diarrhea, bright red blood per rectum, melena, or hematemesis Neurologic:  No visual changes, wkns, changes in mental status. All other systems reviewed and are otherwise negative except as noted above.  Physical Exam    VS:  BP (!) 148/72   Pulse 63   Ht '5\' 7"'$  (1.702 m)   Wt 171 lb (77.6 kg)   SpO2 97%   BMI 26.78 kg/m  , BMI Body mass index is 26.78 kg/m. GEN: Well nourished, well developed, in no acute distress. HEENT: normal. Neck: Supple, no JVD, carotid bruits, or masses. Cardiac: RRR, no murmurs, rubs, or gallops. No clubbing, cyanosis, edema.  Radials/DP/PT 2+ and equal bilaterally.  Respiratory:  Respirations regular and unlabored, clear to auscultation bilaterally. GI: Soft, nontender, nondistended, BS + x 4. MS: no deformity or atrophy. Skin: warm and dry, no rash. Neuro:  Strength and sensation are intact. Psych: Normal affect.  Accessory Clinical Findings    Recent Labs: 03/03/2022: ALT 20 03/19/2022: BUN 17; Creatinine, Ser 1.14; Hemoglobin 12.7; Platelets 177; Potassium 4.1; Sodium 136   Recent Lipid Panel    Component Value Date/Time   CHOL 101 03/02/2022 1130   CHOL 105 02/19/2022 0836   TRIG 48 03/02/2022 1130   HDL 34 (L) 03/02/2022 1130   HDL 35 (L) 02/19/2022 0836   CHOLHDL 3.0 03/02/2022 1130   VLDL 10 03/02/2022 1130   LDLCALC 57 03/02/2022 1130   LDLCALC 58 02/19/2022 0836    HYPERTENSION CONTROL Vitals:   04/10/22 0754 04/10/22 0813  BP: (!) 152/74 (!) 148/72    The patient's blood pressure is elevated above target today.  In order to address the patient's elevated BP: Blood pressure will be monitored at home to determine if medication changes need to be made.; The blood pressure is usually elevated in clinic.  Blood pressures  monitored at  home have been optimal.       ECG personally reviewed by me today-none today.  Echocardiogram 03/02/2022 IMPRESSIONS     1. Left ventricular ejection fraction, by estimation, is 50 to 55%. The  left ventricle has low normal function. The left ventricle demonstrates  regional wall motion abnormalities (see scoring diagram/findings for  description). There is mild concentric  left ventricular hypertrophy. Left ventricular diastolic parameters are  consistent with Grade II diastolic dysfunction (pseudonormalization).  Elevated left atrial pressure.   2. Right ventricular systolic function is normal. The right ventricular  size is normal.   3. Left atrial size was mildly dilated.   4. The mitral valve is normal in structure. No evidence of mitral valve  regurgitation.   5. The aortic valve is tricuspid. Aortic valve regurgitation is trivial.   6. There is borderline dilatation of the ascending aorta, measuring 39  mm.   Comparison(s): No significant change from prior study. Prior images  reviewed side by side.   FINDINGS   Left Ventricle: Left ventricular ejection fraction, by estimation, is 50  to 55%. The left ventricle has low normal function. The left ventricle  demonstrates regional wall motion abnormalities. The left ventricular  internal cavity size was normal in  size. There is mild concentric left ventricular hypertrophy. Left  ventricular diastolic parameters are consistent with Grade II diastolic  dysfunction (pseudonormalization). Elevated left atrial pressure.     LV Wall Scoring:  The apical septal segment, apical anterior segment, apical inferior  segment,  and apex are hypokinetic.   Right Ventricle: The right ventricular size is normal. No increase in  right ventricular wall thickness. Right ventricular systolic function is  normal.   Left Atrium: Left atrial size was mildly dilated.   Right Atrium: Right atrial size was normal in size.    Pericardium: There is no evidence of pericardial effusion.   Mitral Valve: The mitral valve is normal in structure. No evidence of  mitral valve regurgitation.   Tricuspid Valve: The tricuspid valve is normal in structure. Tricuspid  valve regurgitation is not demonstrated.   Aortic Valve: The aortic valve is tricuspid. Aortic valve regurgitation is  trivial.   Pulmonic Valve: The pulmonic valve was normal in structure. Pulmonic valve  regurgitation is not visualized.   Aorta: The aortic root is normal in size and structure. There is  borderline dilatation of the ascending aorta, measuring 39 mm.   IAS/Shunts: No atrial level shunt detected by color flow Doppler.       Cardiac catheterization 12/02/2018  Prox LAD to Mid LAD lesion is 100% stenosed with 50% stenosed side branch in 1st Diag. Post intervention, there is a 0% residual stenosis. Post intervention, the side branch was reduced to 55% residual stenosis. A drug-eluting stent was successfully placed using a STENT RESOLUTE ONYX 3.0X22. Ramus lesion is 55% stenosed. The left ventricular systolic function is normal. LV end diastolic pressure is severely elevated. The left ventricular ejection fraction is 55-65% by visual estimate. There is no mitral valve regurgitation. There is no aortic valve stenosis.   SUMMARY -> ACUTE ANTERIOR STEMI INVOLVING THE LAD Severe single-vessel CAD with 100% proximal LAD occlusion and left dominant system --> successful DES PCI-Resolute Onyx DES 3.0 mm x 32 mm (3.5-3.1 mm) Moderate RI stenosis, not flow-limiting. Preserved EF post PCI with severely elevated LVEDP-ACUTE DIASTOLIC HEART FAILURE Borderline Cardiogenic Shock -> stable on modest dose Levophed     RECOMMENDATIONS Admit to CCU overnight.  Wean Levophed off  as blood pressure tolerates 1 dose of the Lasix given in the Cath Lab, reassess in the morning. Check 2D echo Hold off on beta-blocker/ARB pending blood pressure  stabilization Check lipid panel, A1c and TSH High-dose statin ordered DAPT per recommendations noted elsewhere   Likely not fast-track discharge, but would anticipate discharge in roughly 3 days.     [name], M.D., M.S. Interventional Cardiologist   Pager # [phone] Phone # [phone] [address] [place], [place] [address]  Diagnostic Dominance: Left  Intervention     Assessment & Plan   1.  Coronary artery disease-no chest pain today.  STEMI 9/20 with PCI and DES to his LAD. Continue aspirin, rosuvastatin, nitroglycerin as needed, carvedilol Heart healthy low-sodium diet-salty 6 given Increase physical activity as tolerated  Hyperlipidemia-LDL 57 on 03/02/22 Continue aspirin, rosuvastatin Heart healthy low-sodium diet-salty 6 given Increase physical activity as tolerated  Essential hypertension-BP today 148/72. Well controlled at home.  Continue valsartan, carvedilol Heart healthy low-sodium diet-salty 6 given Increase physical activity as tolerated  History of CVA 12/30-right acute PCA territory infarct with associated right PCA occlusion. Continue aspirin Follows with neurology  Disposition: Follow-up with Dr. Ellyn Hack in 3-4 months.   Jossie Ng. Irbin Fines NP-C     04/10/2022, 8:18 AM [place] [address] Office [phone] Fax [phone]    I spent 14 minutes examining this patient, reviewing medications, and using patient centered shared decision making involving her cardiac care.  Prior to her visit I spent greater than 20 minutes reviewing her past medical history,  medications, and prior cardiac tests.

## 2022-04-09 ENCOUNTER — Inpatient Hospital Stay: Payer: 59 | Admitting: Diagnostic Neuroimaging

## 2022-04-10 ENCOUNTER — Ambulatory Visit: Payer: 59 | Attending: General Practice | Admitting: General Practice

## 2022-04-10 ENCOUNTER — Encounter: Payer: Self-pay | Admitting: General Practice

## 2022-04-10 VITALS — BP 148/72 | HR 63 | Ht 67.0 in | Wt 171.0 lb

## 2022-04-10 DIAGNOSIS — I25110 Atherosclerotic heart disease of native coronary artery with unstable angina pectoris: Secondary | ICD-10-CM

## 2022-04-10 DIAGNOSIS — I10 Essential (primary) hypertension: Secondary | ICD-10-CM | POA: Diagnosis not present

## 2022-04-10 DIAGNOSIS — I639 Cerebral infarction, unspecified: Secondary | ICD-10-CM

## 2022-04-10 DIAGNOSIS — E785 Hyperlipidemia, unspecified: Secondary | ICD-10-CM

## 2022-04-10 NOTE — Patient Instructions (Addendum)
Medication Instructions:   Your physician recommends that you continue on your current medications as directed. Please refer to the Current Medication list given to you today.  *If you need a refill on your cardiac medications before your next appointment, please call your pharmacy*   Lab Work: [place]   If you have labs (blood work) drawn today and your tests are completely normal, you will receive your results only by: [place] (if you have MyChart) OR A paper copy in the mail If you have any lab test that is abnormal or we need to change your treatment, we will call you to review the results.   Testing/Procedures: NONE ORDERED  TODAY      Follow-Up: At [facility], you and your health needs are our priority.  As part of our continuing mission to provide you with exceptional heart care, we have created designated Provider Care Teams.  These Care Teams include your primary Cardiologist (physician) and Advanced Practice Providers (APPs -  Physician Assistants and Nurse Practitioners) who all work together to provide you with the care you need, when you need it.  We recommend signing up for the patient portal called "MyChart".  Sign up information is provided on this After Visit Summary.  MyChart is used to connect with patients for Virtual Visits (Telemedicine).  Patients are able to view lab/test results, encounter notes, upcoming appointments, etc.  Non-urgent messages can be sent to your provider as well.   To learn more about what you can do with MyChart, go to [email or web address].    Your next appointment:   3-4 month(s)  Provider:   [name], MD

## 2022-04-30 ENCOUNTER — Other Ambulatory Visit: Payer: Self-pay | Admitting: Cardiology

## 2022-05-13 ENCOUNTER — Telehealth: Payer: Self-pay | Admitting: Neurology

## 2022-05-13 NOTE — Telephone Encounter (Signed)
LVM and sent mychart msg informing pt of appointment change - MD out

## 2022-05-20 ENCOUNTER — Inpatient Hospital Stay: Payer: 59 | Admitting: Neurology

## 2022-06-04 ENCOUNTER — Encounter: Payer: Self-pay | Admitting: Neurology

## 2022-06-04 ENCOUNTER — Ambulatory Visit (INDEPENDENT_AMBULATORY_CARE_PROVIDER_SITE_OTHER): Payer: 59 | Admitting: Neurology

## 2022-06-04 VITALS — BP 140/80 | HR 59 | Ht 67.0 in | Wt 168.0 lb

## 2022-06-04 DIAGNOSIS — I639 Cerebral infarction, unspecified: Secondary | ICD-10-CM

## 2022-06-04 DIAGNOSIS — I63531 Cerebral infarction due to unspecified occlusion or stenosis of right posterior cerebral artery: Secondary | ICD-10-CM | POA: Diagnosis not present

## 2022-06-04 DIAGNOSIS — H53462 Homonymous bilateral field defects, left side: Secondary | ICD-10-CM

## 2022-06-04 NOTE — Patient Instructions (Signed)
I had a long d/w patient and his wife about his recent cryptogenic stroke, hemorrhagic transformation, left superior visual field defect, risk for recurrent stroke/TIAs, personally independently reviewed imaging studies and stroke evaluation results and answered questions.Continue aspirin 81 mg daily  for secondary stroke prevention and maintain strict control of hypertension with blood pressure goal below 130/90, diabetes with hemoglobin A1c goal below 6.5% and lipids with LDL cholesterol goal below 70 mg/dL. I also advised the patient to eat a healthy diet with plenty of whole grains, cereals, fruits and vegetables, exercise regularly and maintain ideal body weight .Followup in the future with my nurse practitioner in 6 months or call earlier if necessary.  Stroke Prevention Some medical conditions and behaviors can lead to a higher chance of having a stroke. You can help prevent a stroke by eating healthy, exercising, not smoking, and managing any medical conditions you have. Stroke is a leading cause of functional impairment. Primary prevention is particularly important because a majority of strokes are first-time events. Stroke changes the lives of not only those who experience a stroke but also their family and other caregivers. How can this condition affect me? A stroke is a medical emergency and should be treated right away. A stroke can lead to brain damage and can sometimes be life-threatening. If a person gets medical treatment right away, there is a better chance of surviving and recovering from a stroke. What can increase my risk? The following medical conditions may increase your risk of a stroke: Cardiovascular disease. High blood pressure (hypertension). Diabetes. High cholesterol. Sickle cell disease. Blood clotting disorders (hypercoagulable state). Obesity. Sleep disorders (obstructive sleep apnea). Other risk factors include: Being older than age 44. Having a history of blood  clots, stroke, or mini-stroke (transient ischemic attack, TIA). Genetic factors, such as race, ethnicity, or a family history of stroke. Smoking cigarettes or using other tobacco products. Taking birth control pills, especially if you also use tobacco. Heavy use of alcohol or drugs, especially cocaine and methamphetamine. Physical inactivity. What actions can I take to prevent this? Manage your health conditions High cholesterol levels. Eating a healthy diet is important for preventing high cholesterol. If cholesterol cannot be managed through diet alone, you may need to take medicines. Take any prescribed medicines to control your cholesterol as told by your health care provider. Hypertension. To reduce your risk of stroke, try to keep your blood pressure below 130/80. Eating a healthy diet and exercising regularly are important for controlling blood pressure. If these steps are not enough to manage your blood pressure, you may need to take medicines. Take any prescribed medicines to control hypertension as told by your health care provider. Ask your health care provider if you should monitor your blood pressure at home. Have your blood pressure checked every year, even if your blood pressure is normal. Blood pressure increases with age and some medical conditions. Diabetes. Eating a healthy diet and exercising regularly are important parts of managing your blood sugar (glucose). If your blood sugar cannot be managed through diet and exercise, you may need to take medicines. Take any prescribed medicines to control your diabetes as told by your health care provider. Get evaluated for obstructive sleep apnea. Talk to your health care provider about getting a sleep evaluation if you snore a lot or have excessive sleepiness. Make sure that any other medical conditions you have, such as atrial fibrillation or atherosclerosis, are managed. Nutrition Follow instructions from your health care  provider about what to  eat or drink to help manage your health condition. These instructions may include: Reducing your daily calorie intake. Limiting how much salt (sodium) you use to 1,500 milligrams (mg) each day. Using only healthy fats for cooking, such as olive oil, canola oil, or sunflower oil. Eating healthy foods. You can do this by: Choosing foods that are high in fiber, such as whole grains, and fresh fruits and vegetables. Eating at least 5 servings of fruits and vegetables a day. Try to fill one-half of your plate with fruits and vegetables at each meal. Choosing lean protein foods, such as lean cuts of meat, poultry without skin, fish, tofu, beans, and nuts. Eating low-fat dairy products. Avoiding foods that are high in sodium. This can help lower blood pressure. Avoiding foods that have saturated fat, trans fat, and cholesterol. This can help prevent high cholesterol. Avoiding processed and prepared foods. Counting your daily carbohydrate intake.  Lifestyle If you drink alcohol: Limit how much you have to: 0-1 drink a day for women who are not pregnant. 0-2 drinks a day for men. Know how much alcohol is in your drink. In the [place] one drink equals one 12 oz bottle of beer ( ), one 5 oz glass of wine ( ), or one 1 oz glass of hard liquor (69mL). Do not use any products that contain nicotine or tobacco. These products include cigarettes, chewing tobacco, and vaping devices, such as e-cigarettes. If you need help quitting, ask your health care provider. Avoid secondhand smoke. Do not use drugs. Activity  Try to stay at a healthy weight. Get at least 30 minutes of exercise on most days, such as: Fast walking. Biking. Swimming. Medicines Take over-the-counter and prescription medicines only as told by your health care provider. Aspirin or blood thinners (antiplatelets or anticoagulants) may be recommended to reduce your risk of forming blood clots that can lead to  stroke. Avoid taking birth control pills. Talk to your health care provider about the risks of taking birth control pills if: You are over 31 years old. You smoke. You get very bad headaches. You have had a blood clot. Where to find more information American Stroke Association: www.strokeassociation.org Get help right away if: You or a loved one has any symptoms of a stroke. "BE FAST" is an easy way to remember the main warning signs of a stroke: B - Balance. Signs are dizziness, sudden trouble walking, or loss of balance. E - Eyes. Signs are trouble seeing or a sudden change in vision. F - Face. Signs are sudden weakness or numbness of the face, or the face or eyelid drooping on one side. A - Arms. Signs are weakness or numbness in an arm. This happens suddenly and usually on one side of the body. S - Speech. Signs are sudden trouble speaking, slurred speech, or trouble understanding what people say. T - Time. Time to call emergency services. Write down what time symptoms started. You or a loved one has other signs of a stroke, such as: A sudden, severe headache with no known cause. Nausea or vomiting. Seizure. These symptoms may represent a serious problem that is an emergency. Do not wait to see if the symptoms will go away. Get medical help right away. Call your local emergency services (911 in the [place] Do not drive yourself to the hospital. Summary You can help to prevent a stroke by eating healthy, exercising, not smoking, limiting alcohol intake, and managing any medical conditions you may have. Do not use any products that  contain nicotine or tobacco. These include cigarettes, chewing tobacco, and vaping devices, such as e-cigarettes. If you need help quitting, ask your health care provider. Remember "BE FAST" for warning signs of a stroke. Get help right away if you or a loved one has any of these signs. This information is not intended to replace advice given to you by your  health care provider. Make sure you discuss any questions you have with your health care provider. Document Revised: 09/02/2019 Document Reviewed: 09/20/2019 Elsevier Patient Education  [place].

## 2022-06-04 NOTE — Progress Notes (Signed)
[facility] [address] [place]. [place] [address] [phone]       OFFICE FOLLOW-UP NOTE  Zachary Flynn Date of Birth:  1960-12-05 Medical Record Number:  [id number]   HPI: Zachary Flynn is a 62 year old pleasant African-American male seen today for initial office follow-up visit following hospital admission for stroke in January and February 2024.  History is obtained from patient and his wife as well as review of electronic medical records and I personally reviewed pertinent available imaging films in PACS.  He has past medical history of hypertension, hyperlipidemia, coronary artery disease, congestive heart failure who presented initially on 03/02/2022 with dizziness, left-sided vision difficulties and left-sided sensory deficit.  Symptoms began the night prior while watching a football game.  Not seek medical help right away.  CT scan on admission suspicion for right posterior cerebral artery territory infarction and CT angiogram of the brain and neck showed right posterior cerebral artery occlusion P2/P3 junction.  CT perfusion was nondiagnostic and likely pseudo normalized.  MRI scan confirmed acute infarct involving right temporal and occipital lobe as well as right thalamus.  There was remote age infarct noted in the left anterior frontal lobe.  2D echo showed ejection fraction of 50 to 55% with dilated left atrium.  There is also dilatation of the ascending aorta.  LDL cholesterol 57 mg percent.  Hemoglobin A1c was 6.8.  Patient was started on dual antiplatelet therapy aspirin and Brilinta and discharged home with mild deficits of left-sided superior quadrantanopsia and left-sided paresthesias.  Patient however returned on 03/19/2022 with a 5-minute episode of dizziness.  He got up from the kitchen table and felt dizzy with symptoms similar to his prior stroke from 2 weeks ago.  CT scan of the head showed large hemorrhagic right PCA infarct.  No hemorrhage.  Brilinta was  held and he was felt to have delayed hemorrhagic transformation due to dual antiplatelet therapy.  Patient blood pressure was tightly controlled.  He did well and was discharged home with only left-sided subjective paresthesias left upper. quadrantanopsia.  He was started on aspirin which he is tolerating well.  He still have left-sided superior quadrantanopsia as well as, tingling and numbness in left forearm, fingertips and corner of his face.  He is tolerating Crestor well without any side effects.  He did outpatient 30-day heart monitor and no paroxysmal A-fib was found.  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  He has been driving well.  He has not had any recurrent stroke or TIA symptoms.   ROS:   14 system review of systems is positive for difficulty, pain, numbness all other systems negative  PMH:  Past Medical History:  Diagnosis Date   CAD (coronary artery disease) 12/02/2018   a. 100% proximal LAD insetting of anterior STEMI--PCI: Resolute DES 3.0 mm x 22 mm (postdilated 3.5-3.1 mm).  Also 50% proximal RI.  Left dominant system.  Normal EF by LV gram.   Cardiogenic shock 12/2018   Mild shock complicating anterior STEMI; did not require mechanical support   Cervical disc disease    Chronic diastolic CHF (congestive heart failure), NYHA class 1 11/2018   a. LVEDP elevated at time of MI ->; GRII DD on follow-up echo   Erectile dysfunction    Essential hypertension 12/02/2018   Hyperlipidemia with target LDL less than 70 12/02/2018   Mild dilation of ascending aorta  a. by echo 12/2018.   Pre-diabetes     Social History:  Social History   Socioeconomic History   Marital status: Married    Spouse name: Not on file   Number of children: Not on file   Years of education: Not on file   Highest education level: Not on file  Occupational History   Occupation: Chemical engineer: [employer]     Comment: Lieutenant  Tobacco Use   Smoking status: Never   Smokeless tobacco: Never  Vaping Use   Vaping Use: Never used  Substance and Sexual Activity   Alcohol use: Never   Drug use: Never   Sexual activity: Not on file  Other Topics Concern   Not on file  Social History Narrative   Not on file   Social Determinants of Health   Financial Resource Strain: Not on file  Food Insecurity: No Food Insecurity (03/20/2022)   Hunger Vital Sign    Worried About Running Out of Food in the Last Year: Never true    Ran Out of Food in the Last Year: Never true  Transportation Needs: No Transportation Needs (03/20/2022)   PRAPARE - Hydrologist (Medical): No    Lack of Transportation (Non-Medical): No  Physical Activity: Not on file  Stress: Not on file  Social Connections: Not on file  Intimate Partner Violence: Not At Risk (03/20/2022)   Humiliation, Afraid, Rape, and Kick questionnaire    Fear of Current or Ex-Partner: No    Emotionally Abused: No    Physically Abused: No    Sexually Abused: No    Medications:   Current Outpatient Medications on File Prior to Visit  Medication Sig Dispense Refill   acetaminophen (TYLENOL) 500 MG tablet Take 1,000 mg by mouth daily as needed for headache.     aspirin EC 81 MG tablet Take 1 tablet (81 mg total) by mouth daily. Swallow whole. (Patient taking differently: Take 81 mg by mouth in the morning.) 90 tablet 0   carvedilol (COREG) 12.5 MG tablet Take 12.5 mg by mouth 2 (two) times daily.     Misc Natural Products (SAW PALMETTO) CAPS Take 1 capsule by mouth every evening.     rosuvastatin (CRESTOR) 40 MG tablet Take 40 mg by mouth daily.     sildenafil (REVATIO) 20 MG tablet Take 20-100 mg by mouth daily as needed for erectile dysfunction.     valsartan (DIOVAN) 320 MG tablet TAKE 1 TABLET BY MOUTH DAILY 30 tablet 11   nitroGLYCERIN (NITROSTAT) 0.4 MG SL tablet Place 1 tablet (0.4 mg total) under the tongue every  5 (five) minutes as needed for chest pain. (Patient not taking: Reported on 06/04/2022) 25 tablet 6   No current facility-administered medications on file prior to visit.    Allergies:   Allergies  Allergen Reactions   Lactose Intolerance (Gi) Diarrhea   Pollen Extract Itching and Other (See Comments)    Itchy, watery eyes Congestion    Zestril [Lisinopril] Cough    Physical Exam General: well developed, well nourished pleasant middle-aged African-American male, seated, in no evident distress Head: head normocephalic and atraumatic.  Neck: supple with no carotid or supraclavicular bruits Cardiovascular: regular rate and rhythm, no murmurs Musculoskeletal: no deformity Skin:  no rash/petichiae Vascular:  Normal pulses all extremities Vitals:   06/04/22 1546  BP: (!) 140/80  Pulse: (!) 59   Neurologic Exam Mental Status: Awake and fully alert.  Oriented to place and time. Recent and remote memory intact. Attention span, concentration and fund of knowledge appropriate. Mood and affect appropriate.  Cranial Nerves: Fundoscopic exam reveals sharp disc margins. Pupils equal, briskly reactive to light. Extraocular movements full without nystagmus. Visual fields left superior quadrantanopsia  to confrontation. Hearing intact. Facial sensation intact. Face, tongue, palate moves normally and symmetrically.  Motor: Normal bulk and tone. Normal strength in all tested extremity muscles. Sensory.: intact to touch ,pinprick .position and vibratory sensation.  Coordination: Rapid alternating movements normal in all extremities. Finger-to-nose and heel-to-shin performed accurately bilaterally. Gait and Station: Arises from chair without difficulty. Stance is normal. Gait demonstrates normal stride length and balance . Able to heel, toe and tandem walk without difficulty.  Reflexes: 1+ and symmetric. Toes downgoing.   NIHSS  1 Modified Rankin  2   ASSESSMENT: 62 year old African-American male  with right PCA infarct in December 2023 after presenting etiology is subsequent traumatic hemorrhagic transformation likely due to dual antiplatelet therapy.  He is doing well with only mild left superior quadrantanopsia and mild left-sided paresthesias.  Vascular risk factors hypertension and hyperlipidemia.    PLAN:I had a long d/w patient and his wife about his recent cryptogenic stroke, hemorrhagic transformation, left superior visual field defect, risk for recurrent stroke/TIAs, personally independently reviewed imaging studies and stroke evaluation results and answered questions.Continue aspirin 81 mg daily  for secondary stroke prevention and maintain strict control of hypertension with blood pressure goal below 130/90, diabetes with hemoglobin A1c goal below 6.5% and lipids with LDL cholesterol goal below 70 mg/dL. I also advised the patient to eat a healthy diet with plenty of whole grains, cereals, fruits and vegetables, exercise regularly and maintain ideal body weight .Followup in the future with my nurse practitioner in 6 months or call earlier if necessary. Greater than 50% of time during this 35 minute visit was spent on counseling,explanation of diagnosis, planning of further management, discussion with patient and family and coordination of care Antony Contras, MD Note: This document was prepared with digital dictation and possible smart phrase technology. Any transcriptional errors that result from this process are unintentional

## 2022-09-16 ENCOUNTER — Ambulatory Visit: Payer: 59 | Admitting: Cardiology

## 2022-10-10 NOTE — Progress Notes (Signed)
Cardiology Office Note:  .   Date:  10/11/2022  ID:  Zachary Flynn, DOB 1960/03/09, MRN [id number] PCP: Daisy Floro, MD  [facility] Providers Cardiologist:  Bryan Lemma, MD    History of Present Illness: .   Zachary Flynn is a 62 y.o. male with past medical history of CAD s/p anterior STEMI in 11/2018, HTN, HLD, prediabetes, mild thoracic aortic dilation and CVA.  During cath in 11/2018 he also had moderate RI disease which was not obstructed, also noted to have severely elevated LVEDP consistent with acute diastolic heart failure related to MI.  He was seen by Dr. Herbie Baltimore in follow-up on 12/05/2021.  At that time he was stable from a cardiac perspective.  He was continued on 60 mg Brilinta twice daily.  In 02/2022 he was admitted with right acute PCA territory infarct with associated right PCA occlusion, with delayed hemorrhagic conversion on 03/20/22. Following delayed hemorrhagic conversion his Brilinta was discontinued, he was discharged on aspirin 81 mg.  He was noted to have left-sided numbness.  He was instructed follow-up with neurology as an outpatient.  He was seen on 04/10/2022 for cardiology follow-up.  He had remained stable from a cardiac perspective.  He was seen by neurology on 06/04/2022 and was reported is doing well overall.  He was continued on aspirin 81 mg daily for secondary stroke prevention.  Today he presents for follow-up.  He has remained stable from a cardiac perspective.  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  Only residual stroke symptom is intermittent numbness in the left hand that is overall improving.  He has continued working towards a heart healthy diet.   Labs from [facility] 09/06/22: Creatinine 0.94, sodium 142, potassium 4.3, AST 21, ALT 23 Hemoglobin A1C 6.4  ROS: Today he denies chest pain, shortness of breath, lower extremity edema, fatigue, palpitations, melena, hematuria, hemoptysis,  diaphoresis, weakness, presyncope, syncope, orthopnea, and PND.   Studies Reviewed: .       Cardiac Studies & Procedures   CARDIAC CATHETERIZATION  CARDIAC CATHETERIZATION 12/02/2018  Narrative Images from the original result were not included.   Prox LAD to Mid LAD lesion is 100% stenosed with 50% stenosed side branch in 1st Diag.  Post intervention, there is a 0% residual stenosis.  Post intervention, the side branch was reduced to 55% residual stenosis.  A drug-eluting stent was successfully placed using a STENT RESOLUTE ONYX 3.0X22.  Ramus lesion is 55% stenosed.  The left ventricular systolic function is normal.  LV end diastolic pressure is severely elevated.  The left ventricular ejection fraction is 55-65% by visual estimate.  There is no mitral valve regurgitation.  There is no aortic valve stenosis.  SUMMARY -> ACUTE ANTERIOR STEMI INVOLVING THE LAD  Severe single-vessel CAD with 100% proximal LAD occlusion and left dominant system --> successful DES PCI-Resolute Onyx DES 3.0 mm x 32 mm (3.5-3.1 mm)  Moderate RI stenosis, not flow-limiting.  Preserved EF post PCI with severely elevated LVEDP-ACUTE DIASTOLIC HEART FAILURE  Borderline Cardiogenic Shock -> stable on modest dose Levophed   RECOMMENDATIONS  Admit to CCU overnight.  Wean Levophed off as blood pressure tolerates  1 dose of the Lasix given in the Cath Lab, reassess in the morning.  Check 2D echo  Hold off on beta-blocker/ARB pending blood pressure stabilization  Check lipid panel, A1c and TSH  High-dose statin ordered  DAPT per recommendations noted elsewhere  Likely  not fast-track discharge, but would anticipate discharge in roughly 3 days.   Bryan Lemma, M.D., M.S. Interventional Cardiologist  Pager # [phone] Phone # [phone] [address] [place], [place] [address]  Findings Coronary Findings Diagnostic  Dominance: Left  Left Main Vessel is  large.  Left Anterior Descending Prox LAD to Mid LAD lesion is 100% stenosed with 50% stenosed side branch in 1st Diag. Vessel is the culprit lesion. The lesion is type C, located proximal to the major branch, segmental, thrombotic and ulcerative.  First Diagonal Branch Vessel is small in size.  First Septal Branch Vessel is small in size.  Second Diagonal Branch  Ramus Intermedius Vessel is large in size Ramus lesion is 55% stenosed. The lesion is tubular, concentric and smooth.  Lateral Ramus Intermedius Vessel is small in size.  Left Circumflex Vessel is large. Vessel is angiographically normal.  First Obtuse Marginal Branch Vessel is small in size.  Second Obtuse Marginal Branch Vessel is small in size.  Third Obtuse Marginal Branch Vessel is small in size.  First Left Posterolateral Branch Vessel is small in size.  Second Left Posterolateral Branch Vessel is small in size.  Left Posterior Atrioventricular Artery Vessel is angiographically normal.  Right Coronary Artery Vessel is small.  Intervention  Prox LAD to Mid LAD lesion with side branch in 1st Diag Stent - Main Branch Lesion length:  20 mm. CATH VISTA GUIDE 6FR XBLAD3.5 guide catheter was inserted. Lesion crossed with guidewire using a WIRE ASAHI PROWATER 180CM. Pre-stent angioplasty was performed using a BALLOON SAPPHIRE 2.5X12. Maximum pressure:  10 atm. Inflation time:  20 sec. A drug-eluting stent was successfully placed using a STENT RESOLUTE ONYX 3.0X22. Maximum pressure: 14 atm. Inflation time: 30 sec. Minimum lumen area:  3.5 mm. Stent strut is well apposed. Post-stent angioplasty was performed using a BALLOON SAPPHIRE [place] 3.5X18. Maximum pressure:  14 atm. Inflation time:  20 sec. Proximal 2/3 of stent There was slight spasm just distal to the stent.  Resolved with IC nitroglycerin. Post-Intervention Lesion Assessment The intervention was successful. Pre-interventional TIMI flow is 0.  Post-intervention TIMI flow is 3. Treated lesion length:  22 mm. No complications occurred at this lesion. There is a 0% residual stenosis in the main branch post intervention. There is a 55% residual stenosis in the side branch post intervention.     ECHOCARDIOGRAM  ECHOCARDIOGRAM COMPLETE 03/02/2022  Narrative ECHOCARDIOGRAM REPORT    Patient Name:   Zachary Flynn Date of Exam: 03/02/2022 Medical Rec #:  [id number]       Height:       68.0 in Accession #:    [id number]      Weight:       181.8 lb Date of Birth:  [birth year]       BSA:          1.963 m Patient Age:    61 years        BP:           137/85 mmHg Patient Gender: M               HR:           70 bpm. Exam Location:  Inpatient  Procedure: 2D Echo  Indications:    stroke  History:        Patient has prior history of Echocardiogram examinations, most recent 11/24/2019. CAD; Risk Factors:Hypertension and Dyslipidemia.  Sonographer:    Delcie Roch RDCS Referring Phys: [phone] RONDELL A  SMITH  IMPRESSIONS   1. Left ventricular ejection fraction, by estimation, is 50 to 55%. The left ventricle has low normal function. The left ventricle demonstrates regional wall motion abnormalities (see scoring diagram/findings for description). There is mild concentric left ventricular hypertrophy. Left ventricular diastolic parameters are consistent with Grade II diastolic dysfunction (pseudonormalization). Elevated left atrial pressure. 2. Right ventricular systolic function is normal. The right ventricular size is normal. 3. Left atrial size was mildly dilated. 4. The mitral valve is normal in structure. No evidence of mitral valve regurgitation. 5. The aortic valve is tricuspid. Aortic valve regurgitation is trivial. 6. There is borderline dilatation of the ascending aorta, measuring 39 mm.  Comparison(s): No significant change from prior study. Prior images reviewed side by side.  FINDINGS Left Ventricle: Left  ventricular ejection fraction, by estimation, is 50 to 55%. The left ventricle has low normal function. The left ventricle demonstrates regional wall motion abnormalities. The left ventricular internal cavity size was normal in size. There is mild concentric left ventricular hypertrophy. Left ventricular diastolic parameters are consistent with Grade II diastolic dysfunction (pseudonormalization). Elevated left atrial pressure.   LV Wall Scoring: The apical septal segment, apical anterior segment, apical inferior segment, and apex are hypokinetic.  Right Ventricle: The right ventricular size is normal. No increase in right ventricular wall thickness. Right ventricular systolic function is normal.  Left Atrium: Left atrial size was mildly dilated.  Right Atrium: Right atrial size was normal in size.  Pericardium: There is no evidence of pericardial effusion.  Mitral Valve: The mitral valve is normal in structure. No evidence of mitral valve regurgitation.  Tricuspid Valve: The tricuspid valve is normal in structure. Tricuspid valve regurgitation is not demonstrated.  Aortic Valve: The aortic valve is tricuspid. Aortic valve regurgitation is trivial.  Pulmonic Valve: The pulmonic valve was normal in structure. Pulmonic valve regurgitation is not visualized.  Aorta: The aortic root is normal in size and structure. There is borderline dilatation of the ascending aorta, measuring 39 mm.  IAS/Shunts: No atrial level shunt detected by color flow Doppler.   LEFT VENTRICLE PLAX 2D LVIDd:         4.40 cm   Diastology LVIDs:         3.30 cm   LV e' medial:    6.64 cm/s LV PW:         1.30 cm   LV E/e' medial:  14.1 LV IVS:        1.10 cm   LV e' lateral:   7.72 cm/s LVOT diam:     2.00 cm   LV E/e' lateral: 12.1 LV SV:         71 LV SV Index:   36 LVOT Area:     3.14 cm   RIGHT VENTRICLE             IVC RV S prime:     13.20 cm/s  IVC diam: 1.70 cm TAPSE (M-mode): 2.2 cm  LEFT  ATRIUM             Index        RIGHT ATRIUM           Index LA diam:        3.60 cm 1.83 cm/m   RA Area:     13.60 cm LA Vol (A2C):   75.9 ml 38.67 ml/m  RA Volume:   30.80 ml  15.69 ml/m LA Vol (A4C):   56.7 ml 28.89 ml/m LA Biplane  Vol: 68.4 ml 34.85 ml/m AORTIC VALVE LVOT Vmax:   112.00 cm/s LVOT Vmean:  76.200 cm/s LVOT VTI:    0.226 m  AORTA Ao Root diam: 3.60 cm Ao Asc diam:  3.90 cm  MITRAL VALVE MV Area (PHT): 3.42 cm    SHUNTS MV Decel Time: 222 msec    Systemic VTI:  0.23 m MV E velocity: 93.60 cm/s  Systemic Diam: 2.00 cm MV A velocity: 70.80 cm/s MV E/A ratio:  1.32  Mihai Croitoru MD Electronically signed by Thurmon Fair MD Signature Date/Time: 03/02/2022/2:44:18 PM    Final    MONITORS  CARDIAC EVENT MONITOR 04/17/2022  Narrative   30 Day Preventic Monitor: Jan 9 - Apr 10 2022   Predominant Rhythm = Sinus. HR range 38-133 bpm, Avg 69 bpm   Rare Premature Atrial & Ventricular Contractions (PACs & PVCs).   No Sustained Arrhythmias: Atrial Tachycardia (AT), Supraventricular Tachycardia (SVT), Atrial Fibrillation (A-Fib), Atrial Flutter (A-Flutter), Sustained Ventricular Tachycardia (VT)   2 brief Ventricular runs  of 4 & 5 PVCs -> Not Triggered as symptomatic.  Overall relatively normal study.  No atrial fibrillation.  No sustained arrhythmias.   Intention was to identify possible atrial fibrillation.  This does not exclude the possibility of there being paroxysmal atrial fibrillation, but we just did not see it.   Bryan Lemma, MD            Physical Exam:   VS:  BP 134/78   Pulse 62   Ht 5\' 7"  (1.702 m)   Wt 169 lb (76.7 kg)   BMI 26.47 kg/m    Wt Readings from Last 3 Encounters:  10/11/22 169 lb (76.7 kg)  06/04/22 168 lb (76.2 kg)  04/10/22 171 lb (77.6 kg)    GEN: Well nourished, well developed in no acute distress NECK: No JVD; No carotid bruits CARDIAC: RRR, no murmurs, rubs, gallops RESPIRATORY:  Clear to auscultation  without rales, wheezing or rhonchi  ABDOMEN: Soft, non-tender, non-distended EXTREMITIES:  No edema; No deformity   ASSESSMENT AND PLAN: .    CAD: S/p LHC with PCI and DES to the LAD in setting of STEMI in 11/2018.  Echo in 02/2022 indicated EF 50 to 55%, LV with low normal function.  Brilinta stopped in 03/2022 in setting of PCA infarct with delayed hemorrhagic conversion. Stable with no anginal symptoms. No indication for ischemic evaluation.  Heart healthy diet and regular cardiovascular exercise encouraged.  Continue aspirin, rosuvastatin, nitroglycerin as needed, valsartan and carvedilol.  Hyperlipidemia: Lipid profile on 09/06/2022 indicated total cholesterol of 99 and LDL of 54. Continue aspirin and rosuvastatin.   HTN: Blood pressure today 136/80, on recheck was 134/78. He reports blood pressure at home 120/70's at home. Continue Coreg and Valsartan.   History of CVA 02/2022: Right acute PCA territory infarct with associated right PCA occlusion, with delayed hemorrhagic conversion.  30-day cardiac monitor indicated predominant rhythm was sinus rhythm, rare premature atrial and ventricular corrections occurred.  There were no sustained arrhythmias. Followed by neurology.  Continue aspirin.  Dilation of the ascending aorta: Measured 39 mm (borderline dilation) on most recent echo in 02/2022. Can consider repeat echo at follow up for routine monitoring.   Prediabetes: A1C 6.4 on 09/06/22. Monitored and managed by PCP.        Dispo: Follow up with Dr. Herbie Baltimore in 6 months.   Signed, Rip Harbour, NP

## 2022-10-11 ENCOUNTER — Encounter: Payer: Self-pay | Admitting: Cardiology

## 2022-10-11 ENCOUNTER — Ambulatory Visit: Payer: 59 | Admitting: Cardiology

## 2022-10-11 VITALS — BP 134/78 | HR 62 | Ht 67.0 in | Wt 169.0 lb

## 2022-10-11 DIAGNOSIS — R7303 Prediabetes: Secondary | ICD-10-CM

## 2022-10-11 DIAGNOSIS — I10 Essential (primary) hypertension: Secondary | ICD-10-CM

## 2022-10-11 DIAGNOSIS — I639 Cerebral infarction, unspecified: Secondary | ICD-10-CM

## 2022-10-11 DIAGNOSIS — I2510 Atherosclerotic heart disease of native coronary artery without angina pectoris: Secondary | ICD-10-CM

## 2022-10-11 DIAGNOSIS — I77810 Thoracic aortic ectasia: Secondary | ICD-10-CM

## 2022-10-11 DIAGNOSIS — E785 Hyperlipidemia, unspecified: Secondary | ICD-10-CM

## 2022-10-11 NOTE — Patient Instructions (Signed)
 Medication Instructions:  Your physician recommends that you continue on your current medications as directed. Please refer to the Current Medication list given to you today.  *If you need a refill on your cardiac medications before your next appointment, please call your pharmacy*   Lab Work: NONE ordered at this time of appointment     Testing/Procedures: NONE ordered at this time of appointment     Follow-Up: At [facility], you and your health needs are our priority.  As part of our continuing mission to provide you with exceptional heart care, we have created designated Provider Care Teams.  These Care Teams include your primary Cardiologist (physician) and Advanced Practice Providers (APPs -  Physician Assistants and Nurse Practitioners) who all work together to provide you with the care you need, when you need it.  We recommend signing up for the patient portal called "MyChart".  Sign up information is provided on this After Visit Summary.  MyChart is used to connect with patients for Virtual Visits (Telemedicine).  Patients are able to view lab/test results, encounter notes, upcoming appointments, etc.  Non-urgent messages can be sent to your provider as well.   To learn more about what you can do with MyChart, go to [email or web address].    Your next appointment:   6 month(s)  Provider:   Bryan Lemma, MD

## 2022-12-05 NOTE — Progress Notes (Signed)
[facility] [address] [place]. [place] [address] [phone]       OFFICE FOLLOW-UP NOTE  Zachary Flynn Date of Birth:  10/06/1960 Medical Record Number:  [id number]    Primary neurologist: Dr. Pearlean Flynn Reason for visit: Stroke follow-up   Chief Complaint  Patient presents with   Follow-up    Patient in room #3 and alone. Patient states is he well and stable, with no new concerns.     HPI:   Update 12/09/2022 JM: Patient returns for follow-up visit unaccompanied.  Overall stable without new stroke/TIA symptoms.  Reports residual intermittent left-sided paresthesias possibly improving some, denies worsening. Can affect writing at times but this has been gradually improving. Denies any painful sensation, more just annoying.  Denies any residual visual difficulties. 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 Writing affected some but overall doing okay. Remains on aspirin and Crestor.  Monitors BP at home, typically 120s/70s, slightly elevated today which can be normal at appointments. Routinely follows with PCP and cardiology for stroke risk factor management.    History provided for reference purposes only Initial visit 06/04/2022 Dr. Pearlean Flynn: Zachary Flynn is a 62 year old pleasant African-American male seen today for initial office follow-up visit following hospital admission for stroke in January and February 2024.  History is obtained from patient and his wife as well as review of electronic medical records and I personally reviewed pertinent available imaging films in PACS.  He has past medical history of hypertension, hyperlipidemia, coronary artery disease, congestive heart failure who presented initially on 03/02/2022 with dizziness, left-sided vision difficulties and left-sided sensory deficit.  Symptoms began the night prior while watching a football game.  Not seek medical help right away.  CT scan on admission suspicion for right posterior  cerebral artery territory infarction and CT angiogram of the brain and neck showed right posterior cerebral artery occlusion P2/P3 junction.  CT perfusion was nondiagnostic and likely pseudo normalized.  MRI scan confirmed acute infarct involving right temporal and occipital lobe as well as right thalamus.  There was remote age infarct noted in the left anterior frontal lobe.  2D echo showed ejection fraction of 50 to 55% with dilated left atrium.  There is also dilatation of the ascending aorta.  LDL cholesterol 57 mg percent.  Hemoglobin A1c was 6.8.  Patient was started on dual antiplatelet therapy aspirin and Brilinta and discharged home with mild deficits of left-sided superior quadrantanopsia and left-sided paresthesias.  Patient however returned on 03/19/2022 with a 5-minute episode of dizziness.  He got up from the kitchen table and felt dizzy with symptoms similar to his prior stroke from 2 weeks ago.  CT scan of the head showed large hemorrhagic right PCA infarct.  No hemorrhage.  Brilinta was held and he was felt to have delayed hemorrhagic transformation due to dual antiplatelet therapy.  Patient blood pressure was tightly controlled.  He did well and was discharged home with only left-sided subjective paresthesias left upper. quadrantanopsia.  He was started on aspirin which he is tolerating well.  He still have left-sided superior quadrantanopsia as well as, tingling and numbness in left forearm, fingertips and corner of his face.  He is tolerating Crestor well without any side effects.  He did outpatient 30-day heart monitor and no paroxysmal A-fib was found.  He has returned back to work a Engineer, materials at [employer] and has been able to perform his duties without any issues.  He has been driving well.  He has not had any recurrent stroke or TIA symptoms.     ROS:   14 system review of systems is positive for those listed in HPI and all other systems negative  PMH:  Past Medical History:   Diagnosis Date   CAD (coronary artery disease) 12/02/2018   a. 100% proximal LAD insetting of anterior STEMI--PCI: Resolute DES 3.0 mm x 22 mm (postdilated 3.5-3.1 mm).  Also 50% proximal RI.  Left dominant system.  Normal EF by LV gram.   Cardiogenic shock (HCC) 12/2018   Mild shock complicating anterior STEMI; did not require mechanical support   Cervical disc disease    Chronic diastolic CHF (congestive heart failure), NYHA class 1 (HCC) 11/2018   a. LVEDP elevated at time of MI ->; GRII DD on follow-up echo   Erectile dysfunction    Essential hypertension 12/02/2018   Hyperlipidemia with target LDL less than 70 12/02/2018   Mild dilation of ascending aorta (HCC)    a. by echo 12/2018.   Pre-diabetes     Social History:  Social History   Socioeconomic History   Marital status: Married    Spouse name: Not on file   Number of children: Not on file   Years of education: Not on file   Highest education level: Not on file  Occupational History   Occupation: Civil engineer, contracting: [employer]    Comment: Lieutenant  Tobacco Use   Smoking status: Never   Smokeless tobacco: Never  Vaping Use   Vaping status: Never Used  Substance and Sexual Activity   Alcohol use: Never   Drug use: Never   Sexual activity: Not on file  Other Topics Concern   Not on file  Social History Narrative   Not on file   Social Determinants of Health   Financial Resource Strain: Not on file  Food Insecurity: No Food Insecurity (03/20/2022)   Hunger Vital Sign    Worried About Running Out of Food in the Last Year: Never true    Ran Out of Food in the Last Year: Never true  Transportation Needs: No Transportation Needs (03/20/2022)   PRAPARE - Administrator, Civil Service (Medical): No    Lack of Transportation (Non-Medical): No  Physical Activity: Not on file  Stress: Not on file  Social Connections: Not on file  Intimate Partner Violence: Not At Risk (03/20/2022)    Humiliation, Afraid, Rape, and Kick questionnaire    Fear of Current or Ex-Partner: No    Emotionally Abused: No    Physically Abused: No    Sexually Abused: No    Medications:   Current Outpatient Medications on File Prior to Visit  Medication Sig Dispense Refill   aspirin EC 81 MG tablet Take 1 tablet (81 mg total) by mouth daily. Swallow whole. (Patient taking differently: Take 81 mg by mouth in the morning.) 90 tablet 0   carvedilol (COREG) 12.5 MG tablet Take 12.5 mg by mouth 2 (two) times daily.     Misc Natural Products (SAW PALMETTO) CAPS Take 1 capsule by mouth every evening.     rosuvastatin (CRESTOR) 40 MG tablet Take 40 mg by mouth daily.     sildenafil (REVATIO) 20 MG tablet Take 20-100 mg by mouth daily as needed for erectile dysfunction.     valsartan (DIOVAN) 320 MG tablet TAKE 1 TABLET BY MOUTH DAILY 30 tablet 11   nitroGLYCERIN (NITROSTAT) 0.4 MG SL tablet Place 1 tablet (  0.4 mg total) under the tongue every 5 (five) minutes as needed for chest pain. 25 tablet 6   No current facility-administered medications on file prior to visit.    Allergies:   Allergies  Allergen Reactions   Lactose Intolerance (Gi) Diarrhea   Pollen Extract Itching and Other (See Comments)    Itchy, watery eyes Congestion    Zestril [Lisinopril] Cough    Physical Exam Today's Vitals   12/09/22 0747  BP: (!) 148/70  Pulse: 64  Weight: 171 lb (77.6 kg)  Height: 5\' 7"  (1.702 m)   Body mass index is 26.78 kg/m.  General: well developed, well nourished pleasant middle-aged African-American male, seated, in no evident distress Head: head normocephalic and atraumatic.  Neck: supple with no carotid or supraclavicular bruits Cardiovascular: regular rate and rhythm, no murmurs Musculoskeletal: no deformity Skin:  no rash/petichiae Vascular:  Normal pulses all extremities  Neurologic Exam Mental Status: Awake and fully alert. Oriented to place and time. Recent and remote memory intact.  Attention span, concentration and fund of knowledge appropriate. Mood and affect appropriate.  Cranial Nerves: Pupils equal, briskly reactive to light. Extraocular movements full without nystagmus. Visual fields left superior quadrantanopsia to confrontation. Hearing intact. Facial sensation intact. Face, tongue, palate moves normally and symmetrically.  Motor: Normal bulk and tone. Normal strength in all tested extremity muscles. Sensory.: intact to touch ,pinprick .position and vibratory sensation except tingling sensation with light touch of distal LUE and from knee to ankle LLE  Coordination: Rapid alternating movements normal in all extremities. Finger-to-nose and heel-to-shin performed accurately bilaterally. Gait and Station: Arises from chair without difficulty. Stance is normal. Gait demonstrates normal stride length and balance . Able to heel, toe and tandem walk without difficulty.  Reflexes: 1+ and symmetric. Toes downgoing.        ASSESSMENT: 62 year old African-American male with right PCA infarct in December 2023 after presenting etiology is subsequent traumatic hemorrhagic transformation likely due to dual antiplatelet therapy.  He is doing well with only mild left superior quadrantanopsia (although not noticeable to patient) and mild intermittent left-sided paresthesias.  Vascular risk factors hypertension and hyperlipidemia.    PLAN:  -Discussed use of compression glove or compression sleeve to help with paresthesias if becomes overly bothersome. If starts to become painful, can consider trial of gabapentin which can be assisted by PCP. Advised to call if further assistance is needed. -Cardiac monitor negative for concerning arrhythmias -Continue aspirin and Crestor for secondary stroke prevention measures managed by PCP -Continue close PCP and cardiology follow-up for aggressive stroke risk factor management including BP goal<130/90, pre-DM with A1c goal <7.0 and HLD with LDL  goal<70     Doing well from stroke standpoint without further recommendations and risk factors are managed by PCP. He may follow up PRN, as usual for our patients who are strictly being followed for stroke. If any new neurological issues should arise, request PCP place referral for evaluation by one of our neurologists. Thank you.      I spent 25 minutes of face-to-face and non-face-to-face time with patient.  This included previsit chart review, lab review, study review, order entry, electronic health record documentation, patient education and discussion regarding above diagnoses and treatment plan and answered all other questions to patient's satisfaction  Ihor Austin, [facility]  [facility] [address] [place], [place] [address]  Phone [phone] Fax [phone] Note: This document was prepared with digital dictation and possible smart phrase technology. Any transcriptional errors that result  from this process are unintentional.

## 2022-12-09 ENCOUNTER — Encounter: Payer: Self-pay | Admitting: Adult Health

## 2022-12-09 ENCOUNTER — Ambulatory Visit (INDEPENDENT_AMBULATORY_CARE_PROVIDER_SITE_OTHER): Payer: 59 | Admitting: Adult Health

## 2022-12-09 VITALS — BP 148/70 | HR 64 | Ht 67.0 in | Wt 171.0 lb

## 2022-12-09 DIAGNOSIS — I63531 Cerebral infarction due to unspecified occlusion or stenosis of right posterior cerebral artery: Secondary | ICD-10-CM | POA: Diagnosis not present

## 2022-12-09 DIAGNOSIS — I639 Cerebral infarction, unspecified: Secondary | ICD-10-CM

## 2022-12-09 NOTE — Patient Instructions (Addendum)
If sensation starts to become painful, can use a compression glove or compression sleeve on your arm and leg as this can help. If becomes overly bothersome, sometimes gabapentin can be helpful, your PCP should be able to assist with this but if you need further assistance/recommendations, please let me know.   Continue aspirin 81 mg daily  and Crestor for secondary stroke prevention  Continue to follow up with PCP regarding blood pressure, pre-diabetes and cholesterol management  Maintain strict control of hypertension with blood pressure goal below 130/90, pre-diabetes with hemoglobin A1c goal below 7.0 % and cholesterol with LDL cholesterol (bad cholesterol) goal below 70 mg/dL.   Signs of a Stroke? Follow the BEFAST method:  Balance Watch for a sudden loss of balance, trouble with coordination or vertigo Eyes Is there a sudden loss of vision in one or both eyes? Or double vision?  Face: Ask the person to smile. Does one side of the face droop or is it numb?  Arms: Ask the person to raise both arms. Does one arm drift downward? Is there weakness or numbness of a leg? Speech: Ask the person to repeat a simple phrase. Does the speech sound slurred/strange? Is the person confused ? Time: If you observe any of these signs, call [phone].        Thank you for coming to see [place] at [facility]. I hope we have been able to provide you high quality care today.  You may receive a patient satisfaction survey over the next few weeks. We would appreciate your feedback and comments so that we may continue to improve ourselves and the health of our patients.

## 2023-03-05 ENCOUNTER — Other Ambulatory Visit: Payer: Self-pay

## 2023-03-05 ENCOUNTER — Emergency Department (HOSPITAL_BASED_OUTPATIENT_CLINIC_OR_DEPARTMENT_OTHER)
Admission: EM | Admit: 2023-03-05 | Discharge: 2023-03-05 | Disposition: A | Discharge status: Home / Self Care | Payer: 59 | Attending: Emergency Medicine | Admitting: Emergency Medicine

## 2023-03-05 ENCOUNTER — Encounter (HOSPITAL_BASED_OUTPATIENT_CLINIC_OR_DEPARTMENT_OTHER): Payer: Self-pay | Admitting: Emergency Medicine

## 2023-03-05 ENCOUNTER — Emergency Department (HOSPITAL_BASED_OUTPATIENT_CLINIC_OR_DEPARTMENT_OTHER): Payer: 59

## 2023-03-05 DIAGNOSIS — Z7982 Long term (current) use of aspirin: Secondary | ICD-10-CM | POA: Insufficient documentation

## 2023-03-05 DIAGNOSIS — I110 Hypertensive heart disease with heart failure: Secondary | ICD-10-CM | POA: Diagnosis present

## 2023-03-05 DIAGNOSIS — I5032 Chronic diastolic (congestive) heart failure: Secondary | ICD-10-CM | POA: Insufficient documentation

## 2023-03-05 DIAGNOSIS — I10 Essential (primary) hypertension: Secondary | ICD-10-CM

## 2023-03-05 DIAGNOSIS — R930 Abnormal findings on diagnostic imaging of skull and head, not elsewhere classified: Secondary | ICD-10-CM | POA: Diagnosis not present

## 2023-03-05 DIAGNOSIS — I2510 Atherosclerotic heart disease of native coronary artery without angina pectoris: Secondary | ICD-10-CM | POA: Diagnosis not present

## 2023-03-05 DIAGNOSIS — Z79899 Other long term (current) drug therapy: Secondary | ICD-10-CM | POA: Insufficient documentation

## 2023-03-05 NOTE — ED Provider Notes (Signed)
 [name] Provider Note   CSN: [id number] Arrival date & time: 03/05/23  0636     History  Chief Complaint  Patient presents with   Hypertension    Zachary Flynn is a 63 y.o. male.   Hypertension  Patient resents with hypertension.  States blood pressure been elevated yesterday.  States that around 4:30 in the morning he was on his phone and saw stars.  States it was like when he was a child he got hit in the head.  No loss of vision.  No headache.  No chest pain numbness weakness.  Has had a previous stroke around a year ago.  Initially ischemic but then followed by hemorrhagic conversion.  Did have some visual field loss with the stroke but states he does not noticeably have it now.  Saw his eye doctor within the last week.  No chest pain.    Past Medical History:  Diagnosis Date   CAD (coronary artery disease) 12/02/2018   a. 100% proximal LAD insetting of anterior STEMI--PCI: Resolute DES 3.0 mm x 22 mm (postdilated 3.5-3.1 mm).  Also 50% proximal RI.  Left dominant system.  Normal EF by LV gram.   Cardiogenic shock (HCC) 12/2018   Mild shock complicating anterior STEMI; did not require mechanical support   Cervical disc disease    Chronic diastolic CHF (congestive heart failure), NYHA class 1 (HCC) 11/2018   a. LVEDP elevated at time of MI ->; GRII DD on follow-up echo   Erectile dysfunction    Essential hypertension 12/02/2018   Hyperlipidemia with target LDL less than 70 12/02/2018   Mild dilation of ascending aorta (HCC)    a. by echo 12/2018.   Pre-diabetes     Home Medications Prior to Admission medications   Medication Sig Start Date End Date Taking? Authorizing Provider  aspirin  EC 81 MG tablet Take 1 tablet (81 mg total) by mouth daily. Swallow whole. Patient taking differently: Take 81 mg by mouth in the morning. 03/04/22   Lue Elsie BROCKS, MD  carvedilol  (COREG ) 12.5 MG tablet Take 12.5 mg by mouth 2 (two)  times daily. 04/03/22   [provider]  Misc Natural Products (SAW PALMETTO) CAPS Take 1 capsule by mouth every evening.    [provider]  nitroGLYCERIN  (NITROSTAT ) 0.4 MG SL tablet Place 1 tablet (0.4 mg total) under the tongue every 5 (five) minutes as needed for chest pain. 06/29/20 10/11/22  Anner Alm ORN, MD  rosuvastatin  (CRESTOR ) 40 MG tablet Take 40 mg by mouth daily. 04/03/22   [provider]  sildenafil (REVATIO) 20 MG tablet Take 20-100 mg by mouth daily as needed for erectile dysfunction. 08/27/18   [provider]  valsartan  (DIOVAN ) 320 MG tablet TAKE 1 TABLET BY MOUTH DAILY 04/30/22   Anner Alm ORN, MD      Allergies    Lactose intolerance (gi), Pollen extract, and Zestril  [lisinopril ]    Review of Systems   Review of Systems  Physical Exam Updated Vital Signs BP (!) 158/89 (BP Location: Right Arm)   Pulse (!) 55   Temp 98 F (36.7 C) (Oral)   Resp 16   Ht 5' 7 (1.702 m)   Wt 74.8 kg   SpO2 99%   BMI 25.84 kg/m  Physical Exam Vitals and nursing note reviewed.  HENT:     Head: Normocephalic.  Eyes:     Extraocular Movements: Extraocular movements intact.  Cardiovascular:  Rate and Rhythm: Regular rhythm.  Pulmonary:     Breath sounds: No wheezing or rhonchi.  Abdominal:     Tenderness: There is no abdominal tenderness.  Musculoskeletal:        General: No tenderness.     Cervical back: Neck supple.  Neurological:     Mental Status: He is alert and oriented to person, place, and time.     Comments: Eye movements intact.  Normal ambulation.  Potentially has a slight visual field on the left eye upper outer quadrant.     ED Results / Procedures / Treatments   Labs (all labs ordered are listed, but only abnormal results are displayed) Labs Reviewed - No data to display  EKG EKG Interpretation Date/Time:  Wednesday March 05 2023 06:54:53 EST Ventricular Rate:  63 PR Interval:  145 QRS Duration:  104 QT  Interval:  371 QTC Calculation: 380 R Axis:   -63  Text Interpretation: Sinus rhythm Left anterior fascicular block Left ventricular hypertrophy Anterior Q waves, possibly due to LVH Nonspecific T abnormalities, lateral leads No significant change since last tracing Confirmed by Patsey Lot [phone]) on 03/05/2023 7:10:02 AM  Radiology CT Head Wo Contrast Result Date: 03/05/2023 CLINICAL DATA:  Neuro deficit with acute stroke suspected. EXAM: CT HEAD WITHOUT CONTRAST TECHNIQUE: Contiguous axial images were obtained from the base of the skull through the vertex without intravenous contrast. RADIATION DOSE REDUCTION: This exam was performed according to the departmental dose-optimization program which includes automated exposure control, adjustment of the mA and/or kV according to patient size and/or use of iterative reconstruction technique. COMPARISON:  03/20/2022 FINDINGS: Brain: No evidence of acute infarction, hemorrhage, hydrocephalus, extra-axial collection or mass lesion/mass effect. Remote infarcts in the right PCA distribution and at the left frontal operculum. Small chronic right posterior frontal cortex infarct Vascular: No hyperdense vessel or unexpected calcification. Skull: Normal. Negative for fracture or focal lesion. Sinuses/Orbits: No acute finding. IMPRESSION: 1. No acute finding. 2. Chronic bifrontal and right PCA infarcts. Electronically Signed   By: Dorn Roulette M.D.   On: 03/05/2023 08:02    Procedures Procedures    Medications Ordered in ED Medications - No data to display  ED Course/ Medical Decision Making/ A&P                                 Medical Decision Making Amount and/or Complexity of Data Reviewed Radiology: ordered.   Patient with stars in his vision.  Began last night but resolved.  What short duration.  Has had previous stroke.  Blood pressure is elevated.  Has not had this morning's medicine yet.  Patient has the medicines here and we will have  him take them.  No deficits no.  Will get head CT since has had previous hemorrhagic stroke.  Reviewed previous neurology and cardiology note.  Head CT reassuring.  No intracranial hemorrhage.  No further neurodeficits.  Blood pressures come down to 160/90.  Do not think we need more acute intervention at this time.  Doubt was TIA.  Follow-up with PCP for       Final Clinical Impression(s) / ED Diagnoses Final diagnoses:  Hypertension, unspecified type    Rx / DC Orders ED Discharge Orders     None         Patsey Lot, MD 03/05/23 [phone]

## 2023-03-05 NOTE — ED Triage Notes (Signed)
 Pt reports "seeing stars" at ~ 0430 today, reports has resolved. Reports checked b/p and was more elevated than usual. 160/93. Pt denies CP

## 2023-03-05 NOTE — Discharge Instructions (Signed)
 Your blood pressure come down.  Your CAT scan was reassuring.  Follow-up with your doctor as needed.  Return for further neurologic findings.

## 2023-04-25 ENCOUNTER — Encounter: Payer: Self-pay | Admitting: Internal Medicine

## 2023-05-05 ENCOUNTER — Other Ambulatory Visit: Payer: Self-pay | Admitting: Cardiology

## 2023-05-05 MED ORDER — NITROGLYCERIN 0.4 MG SL SUBL: 0.4000 mg | SUBLINGUAL_TABLET | SUBLINGUAL | 6 refills | Status: AC | PRN

## 2023-05-06 ENCOUNTER — Ambulatory Visit: Payer: 59 | Attending: Cardiology | Admitting: Cardiology

## 2023-05-06 VITALS — BP 130/78 | HR 51 | Ht 67.0 in | Wt 171.0 lb

## 2023-05-06 DIAGNOSIS — Z955 Presence of coronary angioplasty implant and graft: Secondary | ICD-10-CM

## 2023-05-06 DIAGNOSIS — I10 Essential (primary) hypertension: Secondary | ICD-10-CM

## 2023-05-06 DIAGNOSIS — I63431 Cerebral infarction due to embolism of right posterior cerebral artery: Secondary | ICD-10-CM | POA: Diagnosis not present

## 2023-05-06 DIAGNOSIS — I2102 ST elevation (STEMI) myocardial infarction involving left anterior descending coronary artery: Secondary | ICD-10-CM

## 2023-05-06 DIAGNOSIS — I2510 Atherosclerotic heart disease of native coronary artery without angina pectoris: Secondary | ICD-10-CM | POA: Diagnosis not present

## 2023-05-06 DIAGNOSIS — E785 Hyperlipidemia, unspecified: Secondary | ICD-10-CM

## 2023-05-06 NOTE — Patient Instructions (Addendum)

## 2023-05-06 NOTE — Progress Notes (Signed)
 Cardiology Office Note:  .   Date:  05/10/2023  ID:  Dolores Patty, DOB 03-08-1960, MRN [id number] PCP: Daisy Floro, MD  [facility] Providers Cardiologist:  Bryan Lemma, MD     Chief Complaint  Patient presents with   Follow-up    18-month follow-up.   Coronary Artery Disease    No angina    Patient Profile: .     Zachary Flynn is a 63 y.o. male with a PMH notable for CAD (Anterior STEMI-LAD PCI 11/2018), HLD, pre-DM and CVA (12/2021) who presents here for 47-month follow-up at the request of Daisy Floro, MD.  PMH: Anterior STEMI 11/2018: LAD PCI with residual RI disease. HTN, HLD, pre-DM Right PCA territory CVA with associated right PCA occlusion (02/2022)/delayed hemorrhage conversion 03/20/2022) Brilinta discontinued and continued on aspirin numbness.    KAVONTAE PRITCHARD was last seen on October 11, 2022, by [facility], NP-doing well on follow-up walking 3 miles every morning without angina.  Numbness of left hand.  Otherwise improving.  LDL 54 on rosuvastatin.  BP stable On carvedilol and valsartan.  A1c stable at 6.4. => No medication changes made.  He went to the ER on March 05, 2023 suddenly started seeing stars.  He was like when he got hit in the head as a child..  Episode was short-lived.  Head CT nonrevealing.  Blood pressures were elevated.  Subjective  Discussed the use of AI scribe software for clinical note transcription with the patient, who gave verbal consent to proceed.  History of Present Illness   QUENCY TOBER is a 63 year old male with a history of stroke and coronary artery disease who presents for follow-up.  He has a history of a heart attack in December 2020, with 100% occlusion of the proximal LAD-treated with DES PCI. Initially on dual antiplatelet therapy with aspirin and Brilinta, he is now on aspirin alone. No heart racing, skipping, chest pain, or dyspnea.  In December 2023, he experienced a stroke,-initially ischemic,  then with some hemorrhagic conversion.  Major residual side effect is nerve issues and numbness in his fingers, without associated weakness. At the time, he was on aspirin and Brilinta, but Brilinta was discontinued due to bleeding concerns, and he was restarted on aspirin.  His current medications include valsartan 320 mg, HCTZ 12.5 mg, carvedilol 12.5 mg twice a day, and Crestor 40 mg. His last lab work in July 2024 showed a total cholesterol of 99, triglycerides 61, HDL 31, and LDL 54. His A1c was 6.4% in January 2025.  Since retiring from [employer] in January 2025, he has been more physically active, walking three to four miles daily. No shortness of breath, swelling in the legs, or pain in the calves or thighs.  His insurance situation changed after retirement, with increased monthly premiums until he transitioned to Medicare at 13.     Cardiovascular ROS: no chest pain or dyspnea on exertion negative for - edema, irregular heartbeat, orthopnea, palpitations, paroxysmal nocturnal dyspnea, rapid heart rate, shortness of breath, or syncope/near syncope or TIA/amaurosis fugax, medication  ROS:  Review of Systems - Negative except minor hand numbness from CVA    Objective   Medications - Aspirin 81 mg - Valsartan 320 mg - HCTZ 12.5 mg - Carvedilol 12.5 mg twice a day - Crestor 40 mg - NTG 0.4 mg SL PRN  Studies Reviewed: Marland Kitchen        ECHO (03/26/2023): EF 50 to 55%.  No RWMA.  Mild concentric LVH with GR 2 DD and elevated LAP.  Mild LA dilation.  Otherwise normal study.  No change from prior study. 30-day [employer] event monitor January-February 2024:   Predominant Rhythm = Sinus. HR range 38-133 bpm, Avg 69 bpm   Rare Premature Atrial & Ventricular Contractions (PACs & PVCs).   No Sustained Arrhythmias: Atrial Tachycardia (AT), Supraventricular Tachycardia (SVT), Atrial Fibrillation (A-Fib), Atrial Flutter (A-Flutter), Non-Sustained Ventricular Tachycardia (VT)   2 brief  Ventricular runs  of 4 & 5 PVCs -> Not Triggered as symptomatic.   Overall relatively normal study.  No atrial fibrillation.  No sustained arrhythmias.  Cardiac Cath PCI 12/02/2018: (Anterior STEMI) Diagnostic Dominance: Left; p-mLAD 100%, RI 55%, EF 50-55%  Intervention: p-mLAD Resolute Onyx DES 3.0 mm x 32 mm (3.5-3.1 mm) crossing D1.     Lab Results  Component Value Date   CHOL 101 03/02/2022   HDL 34 (L) 03/02/2022   LDLCALC 57 03/02/2022   TRIG 48 03/02/2022   CHOLHDL 3.0 03/02/2022  Results LABS Total cholesterol: 99 (09/06/2022) Triglyceride: 61 (09/06/2022) HDL: 31 (09/06/2022) LDL: 54 (09/06/2022) A1c: 2.7% (03/27/2023)   Risk Assessment/Calculations:         Physical Exam:   VS:  BP 130/78 (BP Location: Right Arm, Patient Position: Sitting, Cuff Size: Normal)   Pulse (!) 51   Ht 5\' 7"  (1.702 m)   Wt 171 lb (77.6 kg)   SpO2 99%   BMI 26.78 kg/m    Wt Readings from Last 3 Encounters:  05/06/23 171 lb (77.6 kg)  03/05/23 165 lb (74.8 kg)  12/09/22 171 lb (77.6 kg)    GEN: Well nourished, well developed in no acute distress; healthy-appearing.  Well-groomed. NECK: No JVD; No carotid bruits CARDIAC: Normal S1, S2; RRR, no murmurs, rubs, gallops RESPIRATORY:  Clear to auscultation without rales, wheezing or rhonchi ; nonlabored, good air movement. ABDOMEN: Soft, non-tender, non-distended EXTREMITIES:  No edema; No deformity     ASSESSMENT AND PLAN: .    Problem List Items Addressed This Visit       Cardiology Problems   Coronary artery disease involving native coronary artery of native heart without angina pectoris - Primary (Chronic)   Anterior STEMI in September 2020 with 100% occlusion of proximal LAD-DES PCI.  Modest 55% RI disease.   Low normal EF on echo. Stable cardiac status, no symptoms. Remains on a stable regimen. - Continued aspirin after stopping Brilinta with hemorrhagic stroke conversion. - Continue carvedilol 12.5 mg twice daily,  valsartan 320 mg daily. - Continue Crestor 40 mg daily. -As needed NTG.  Not requiring.      Relevant Medications   hydrochlorothiazide (HYDRODIURIL) 12.5 MG tablet   CVA (cerebral vascular accident) (HCC) (Chronic)   Stroke in December 2023 with residual neuropathy, no weakness. No atrial fibrillation.  Managed with aspirin due to bleeding concerns with Brilinta.  Stable with no new symptoms. - Continue aspirin 81 mg daily. -Continue other GDMT for CAD      Relevant Medications   hydrochlorothiazide (HYDRODIURIL) 12.5 MG tablet   Essential hypertension (Chronic)   Hypertension with hypertensive heart disease => Echocardiogram showed diastolic dysfunction.  Well-controlled on current regimen although he has had episodes of hypotension - Continue valsartan 320 mg daily. - Continue HCTZ 12.5 mg daily => for additional control would increase to 25 mg daily if necessary. - Continue carvedilol 12.5 mg twice daily.      Relevant Medications   hydrochlorothiazide (HYDRODIURIL) 12.5 MG tablet  H/o: STEMI involving left anterior descending coronary artery (HCC) (Chronic)   4 and half years out from anterior STEMI.  Doing well.  No signs or symptoms of heart failure or angina.      Relevant Medications   hydrochlorothiazide (HYDRODIURIL) 12.5 MG tablet   Hyperlipidemia with target low density lipoprotein (LDL) cholesterol less than 55 mg/dL (Chronic)   LDL at target of 54, HDL low at 34.  On stable dose of 40 mg Crestor, advised exercise to improve HDL. - Continue Crestor 40 mg daily. - Encourage regular exercise.  HbA1c 6.4%, borderline diabetes. Well-managed.      Relevant Medications   hydrochlorothiazide (HYDRODIURIL) 12.5 MG tablet     Follow-up Stable cardiac status, no new issues. Last seen October 2023. Follow-up interval affected by stroke. - Schedule follow-up appointment in one year.  Return in about 1 year (around 05/05/2024) for Routine follow up with me, [employer].    Signed, Marykay Lex, MD, MS Bryan Lemma, M.D., M.S. Interventional Cardiologist  [facility]  Pager # [phone] Phone # [phone] [address] [place], [place] [address]

## 2023-05-10 ENCOUNTER — Encounter: Payer: Self-pay | Admitting: Cardiology

## 2023-05-10 NOTE — Assessment & Plan Note (Signed)
 Hypertension with hypertensive heart disease => Echocardiogram showed diastolic dysfunction.  Well-controlled on current regimen although he has had episodes of hypotension - Continue valsartan 320 mg daily. - Continue HCTZ 12.5 mg daily => for additional control would increase to 25 mg daily if necessary. - Continue carvedilol 12.5 mg twice daily.

## 2023-05-10 NOTE — Assessment & Plan Note (Signed)
 Anterior STEMI in September 2020 with 100% occlusion of proximal LAD-DES PCI.  Modest 55% RI disease.   Low normal EF on echo. Stable cardiac status, no symptoms. Remains on a stable regimen. - Continued aspirin after stopping Brilinta with hemorrhagic stroke conversion. - Continue carvedilol 12.5 mg twice daily, valsartan 320 mg daily. - Continue Crestor 40 mg daily. -As needed NTG.  Not requiring.

## 2023-05-10 NOTE — Assessment & Plan Note (Signed)
 4 and half years out from anterior STEMI.  Doing well.  No signs or symptoms of heart failure or angina.

## 2023-05-10 NOTE — Assessment & Plan Note (Signed)
 Stroke in December 2023 with residual neuropathy, no weakness. No atrial fibrillation.  Managed with aspirin due to bleeding concerns with Brilinta.  Stable with no new symptoms. - Continue aspirin 81 mg daily. -Continue other GDMT for CAD

## 2023-05-10 NOTE — Assessment & Plan Note (Signed)
 LDL at target of 54, HDL low at 34.  On stable dose of 40 mg Crestor, advised exercise to improve HDL. - Continue Crestor 40 mg daily. - Encourage regular exercise.  HbA1c 6.4%, borderline diabetes. Well-managed.

## 2023-10-06 DIAGNOSIS — Z0000 Encounter for general adult medical examination without abnormal findings: Secondary | ICD-10-CM | POA: Diagnosis not present

## 2023-10-06 DIAGNOSIS — Z23 Encounter for immunization: Secondary | ICD-10-CM | POA: Diagnosis not present

## 2023-10-06 DIAGNOSIS — I2510 Atherosclerotic heart disease of native coronary artery without angina pectoris: Secondary | ICD-10-CM | POA: Diagnosis not present

## 2023-10-06 DIAGNOSIS — E1169 Type 2 diabetes mellitus with other specified complication: Secondary | ICD-10-CM | POA: Diagnosis not present

## 2023-10-06 DIAGNOSIS — N529 Male erectile dysfunction, unspecified: Secondary | ICD-10-CM | POA: Diagnosis not present

## 2023-10-06 DIAGNOSIS — I10 Essential (primary) hypertension: Secondary | ICD-10-CM | POA: Diagnosis not present

## 2023-10-06 DIAGNOSIS — Z6826 Body mass index (BMI) 26.0-26.9, adult: Secondary | ICD-10-CM | POA: Diagnosis not present

## 2023-10-06 DIAGNOSIS — Z125 Encounter for screening for malignant neoplasm of prostate: Secondary | ICD-10-CM | POA: Diagnosis not present

## 2023-10-27 IMAGING — CT CT ANGIO CHEST
3 of 6 series · 17 of 46 positions shown · IV contrast (OMNIPAQUE)
Comparison: None.

CLINICAL DATA: 60-year-old male with a history of thoracic aortic
aneurysm

EXAM:
CT ANGIOGRAPHY CHEST WITH CONTRAST
TECHNIQUE: Multidetector CT imaging of the chest was performed using the
standard protocol during bolus administration of intravenous
contrast. Multiplanar CT image reconstructions and MIPs were
obtained to evaluate the vascular anatomy.
CONTRAST:  75mL OMNIPAQUE IOHEXOL 350 MG/ML SOLN

[Series 5: axial arterial · axial · arterial · 0.76mm/px · z∈[-380,-77]mm · 11 of 123 slices shown]
[im 11/123  lung]
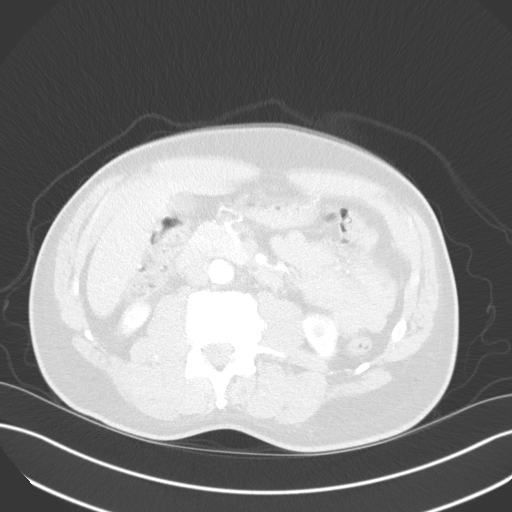
[im 21/123  soft-tissue]
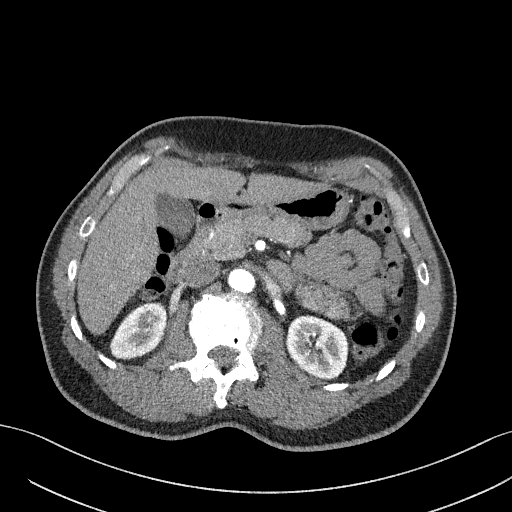
[im 31/123  lung]
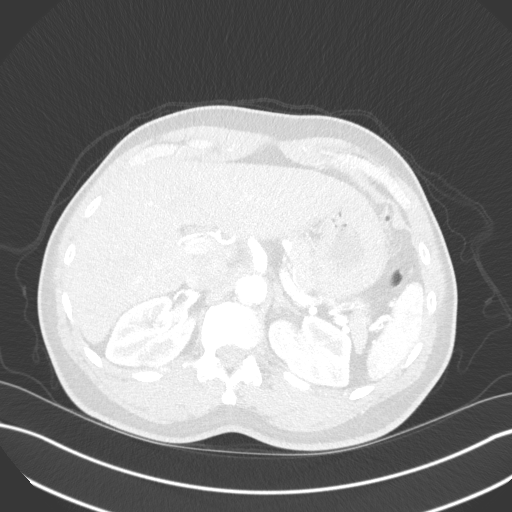
[im 41/123  soft-tissue]
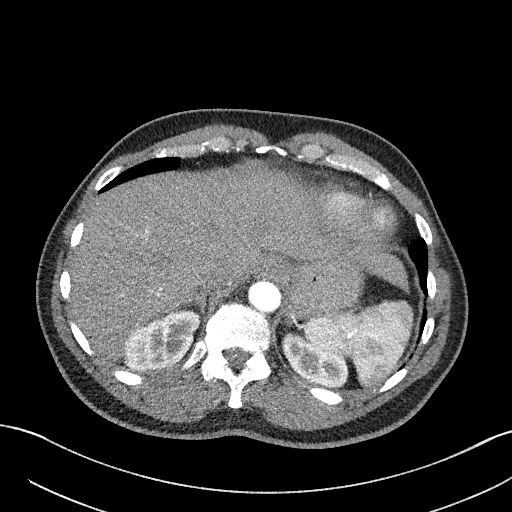
[im 51/123  lung]
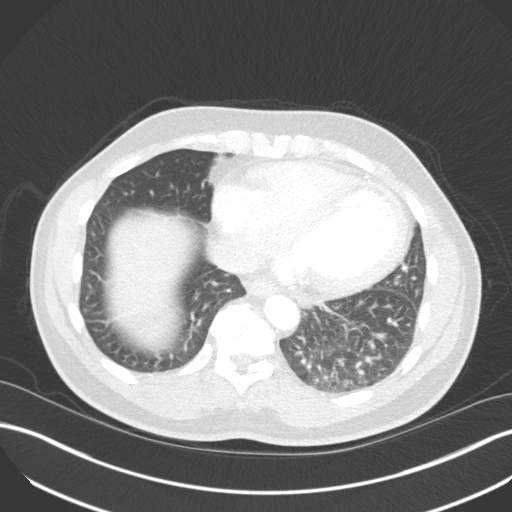
[im 62/123  soft-tissue]
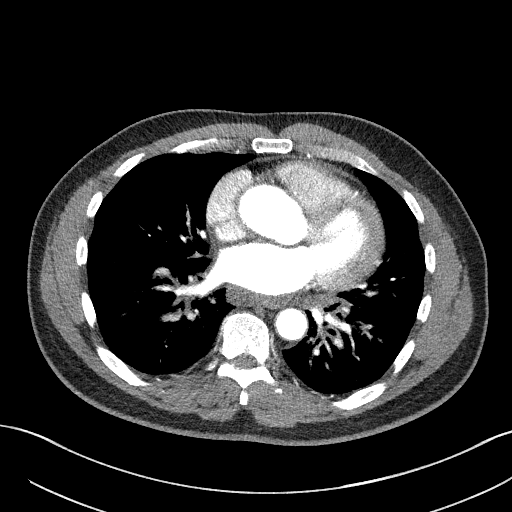
[im 72/123  lung]
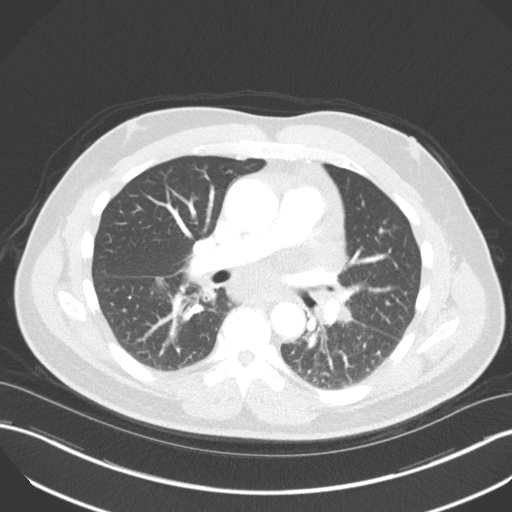
[im 82/123  soft-tissue]
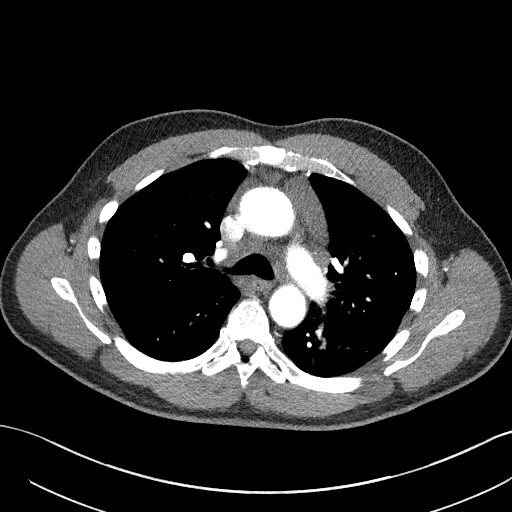
[im 92/123  lung]
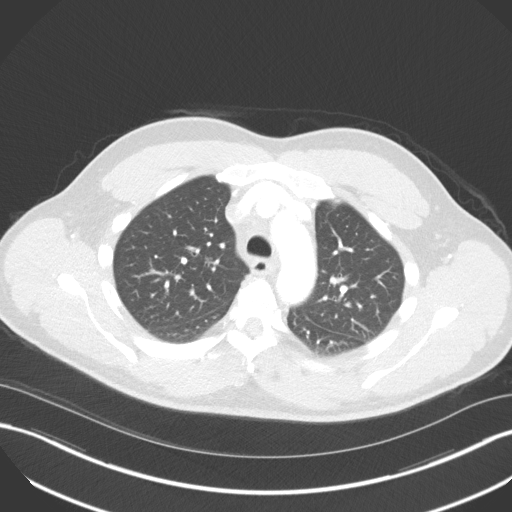
[im 102/123  soft-tissue]
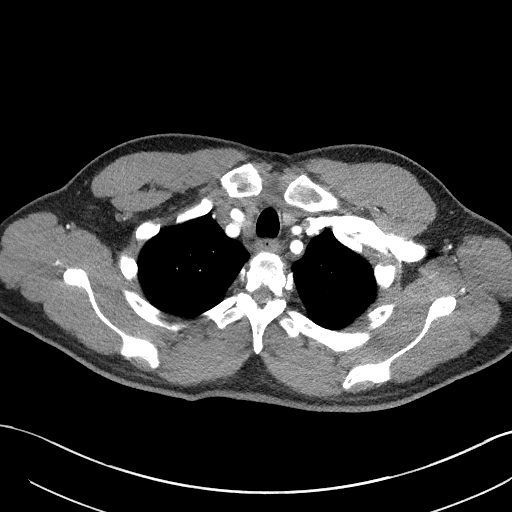
[im 112/123  lung]
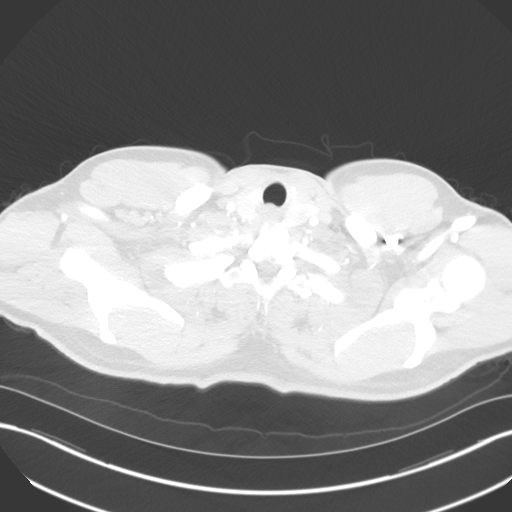

[Series 6: lung · axial · 0.76mm/px · z∈[-370,-288]mm · 3 of 184 slices shown]
[im 21/184  soft-tissue]
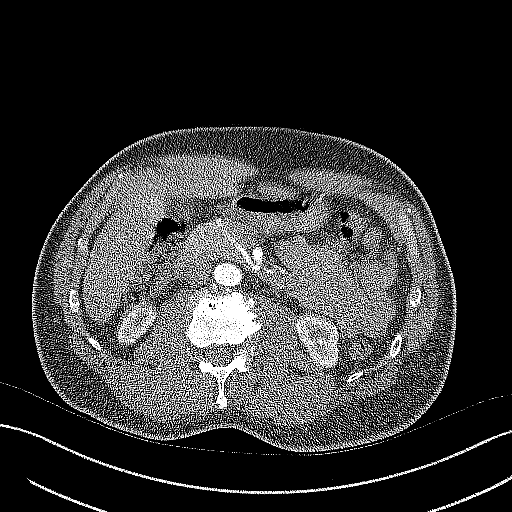
[im 41/184  soft-tissue]
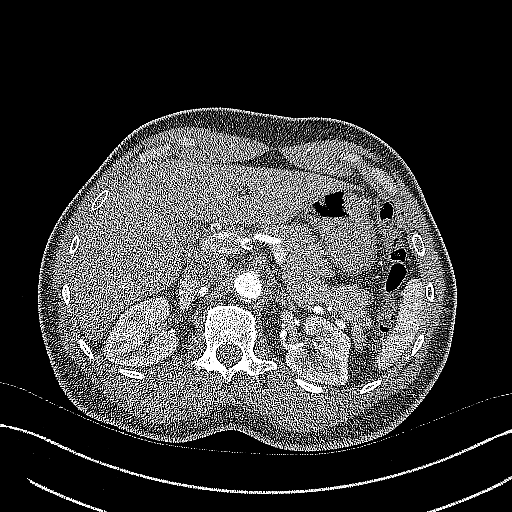
[im 62/184  soft-tissue]
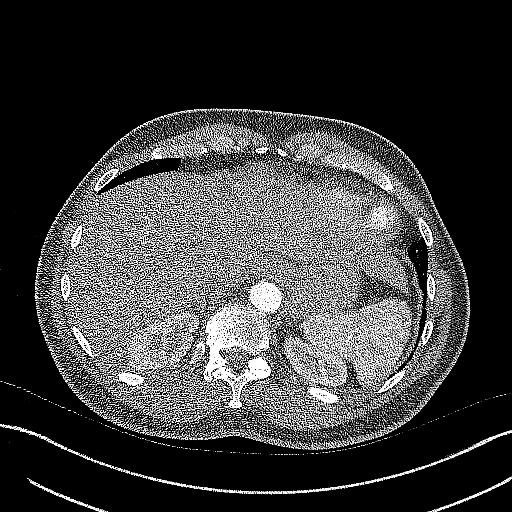

[Series 8: coronals · coronal · 0.74mm/px · 3 of 143 slices shown]
[im 36/143  soft-tissue]
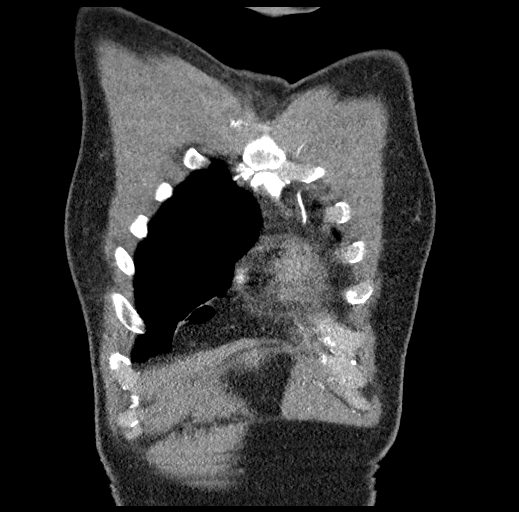
[im 72/143  soft-tissue]
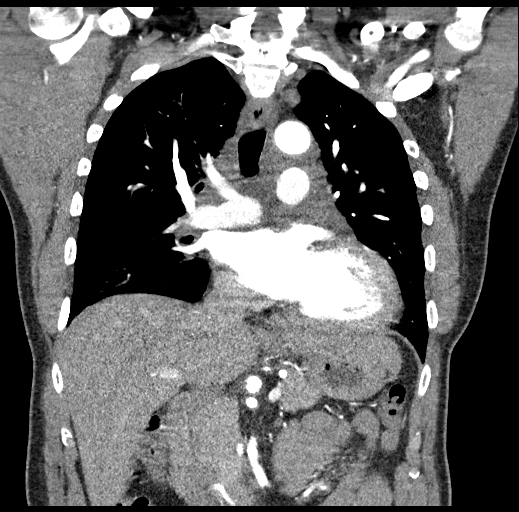
[im 107/143  soft-tissue]
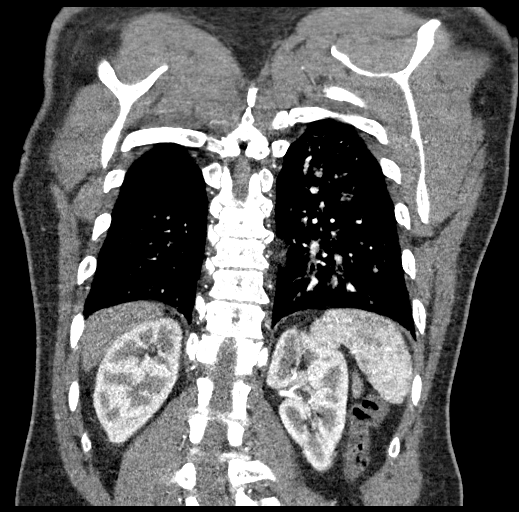

[17 of 46 positions shown; findings below may reference images not displayed]

FINDINGS: Cardiovascular:

Heart:

Heart size within normal limits. Small volume pericardial
effusion/thickening. No comparison. Fluid within the pericardial
recesses. Calcifications/stenting of the left anterior descending
coronary artery.

Aorta:

No significant aortic valve calcifications.

Greatest estimated diameter of the aortic annulus 28 mm on the
coronal reformatted images.

Greatest estimated diameter of the Dayan junction, 33 mm on
the coronal images.

Greatest estimated diameter of the ascending aorta on the axial
images, 37 mm.

No significant calcified atherosclerotic changes of the aorta.
Branch vessels are patent with a 3 vessel arch. Cervical cerebral
vessels patent at the base of the neck.

No pedunculated plaque, ulcerated plaque, dissection, periaortic
fluid. No wall thickening.

Pulmonary arteries:

Timing of the contrast bolus is not optimized for evaluation of
pulmonary artery filling defects. Unremarkable size of the main
pulmonary artery.

Mediastinum/Nodes: Fluid within the pericardial recesses. No
definite evidence of adenopathy within the upper paratracheal nodal
stations. Small lymph nodes present in the preaortic nodal station

Lymphadenopathy versus pericardial fluid in the subcarinal region,
with transverse dimension of 28 mm. No comparison available.

5 mm nodule of the lower right thyroid lobe, requiring no follow-up
in a patient of this age.

Lungs/Pleura: There is a lower lobe and right middle lobe
predominant pattern of significant peribronchial thickening with
absence of interlobular septal thickening, fissural thickening,
pleural effusion.

Relative absent of ground-glass opacity or confluent airspace
disease.

Right greater than left hilar lymphadenopathy.

Central airways are clear with no tracheal wall thickening,
nodularity, or calcification.

Relative absence of tree-in-bud nodularity or perceptible
changes/findings of the secondary pulmonary lobule.

Upper Abdomen: No acute finding of the upper abdomen.

Sagittal view demonstrates narrowing of the celiac artery origin,
with a configuration that is most compatible with compression of the
diaphragmatic crus. There is associated poststenotic dilation as
well as a chronic dissection/aneurysm measuring 10 mm diameter on
the sagittal reformatted images.

Musculoskeletal: Degenerative changes of the visualized spine.
Incompletely imaged surgical changes of the cervical region. No bony
canal narrowing. No acute displaced fracture.

Review of the MIP images confirms the above findings.
IMPRESSION: The CT angiogram is negative for ascending aortic aneurysm, with
maximum estimated diameter of the ascending aorta 3.7 cm.

Pattern of bronchial wall thickening of predominantly the bilateral
lower lobes segmental and subsegmental airways. Associated mild
right greater than left hilar lymph nodes and small lymph nodes of
the mediastinum. Leading differential diagnosis would be infectious
such as bronchitis or other atypical infection, however, chronic
granulomatous disease including sarcoid could have this appearance.
Malignancy is thought to be less likely. Follow-up contrast-enhanced
chest CT is recommended in 2-3 months as well as consideration of
pulmonary referral, particularly if there is no concern for acute
infection.

Small pericardial fluid, with fluid in the pericardial recesses.

Coronary artery disease/prior PTC I

Incidental imaging of celiac artery aneurysm measuring 10 mm, most
likely related to poststenotic dilation of the celiac artery
secondary to diaphragmatic crus compression

## 2024-01-12 DIAGNOSIS — R972 Elevated prostate specific antigen [PSA]: Secondary | ICD-10-CM | POA: Diagnosis not present

## 2024-01-12 DIAGNOSIS — E785 Hyperlipidemia, unspecified: Secondary | ICD-10-CM | POA: Diagnosis not present

## 2024-05-26 ENCOUNTER — Ambulatory Visit: Admitting: Cardiology
